# Patient Record
Sex: Male | Born: 1940 | Race: White | Hispanic: No | Marital: Married | Smoking: Never smoker
Health system: Southern US, Community
[De-identification: ages and names within clinical notes are randomized; demographics above are authoritative.]

## PROBLEM LIST (undated history)

## (undated) DIAGNOSIS — I10 Essential (primary) hypertension: Secondary | ICD-10-CM

## (undated) DIAGNOSIS — I6521 Occlusion and stenosis of right carotid artery: Secondary | ICD-10-CM

## (undated) DIAGNOSIS — M109 Gout, unspecified: Secondary | ICD-10-CM

## (undated) DIAGNOSIS — K4090 Unilateral inguinal hernia, without obstruction or gangrene, not specified as recurrent: Secondary | ICD-10-CM

## (undated) DIAGNOSIS — K219 Gastro-esophageal reflux disease without esophagitis: Secondary | ICD-10-CM

## (undated) DIAGNOSIS — R0683 Snoring: Principal | ICD-10-CM

## (undated) DIAGNOSIS — N200 Calculus of kidney: Secondary | ICD-10-CM

## (undated) DIAGNOSIS — I517 Cardiomegaly: Secondary | ICD-10-CM

## (undated) DIAGNOSIS — M8580 Other specified disorders of bone density and structure, unspecified site: Secondary | ICD-10-CM

## (undated) DIAGNOSIS — K5792 Diverticulitis of intestine, part unspecified, without perforation or abscess without bleeding: Secondary | ICD-10-CM

## (undated) DIAGNOSIS — I739 Peripheral vascular disease, unspecified: Secondary | ICD-10-CM

## (undated) HISTORY — DX: Peripheral vascular disease, unspecified: I73.9

## (undated) HISTORY — DX: Snoring: R06.83

## (undated) HISTORY — DX: Gout, unspecified: M10.9

## (undated) HISTORY — DX: Unilateral inguinal hernia, without obstruction or gangrene, not specified as recurrent: K40.90

## (undated) HISTORY — PX: HERNIA REPAIR: SHX51

## (undated) HISTORY — DX: Occlusion and stenosis of right carotid artery: I65.21

## (undated) HISTORY — DX: Diverticulitis of intestine, part unspecified, without perforation or abscess without bleeding: K57.92

## (undated) HISTORY — DX: Essential (primary) hypertension: I10

## (undated) HISTORY — DX: Other specified disorders of bone density and structure, unspecified site: M85.80

## (undated) HISTORY — DX: Gastro-esophageal reflux disease without esophagitis: K21.9

## (undated) HISTORY — DX: Cardiomegaly: I51.7

## (undated) HISTORY — DX: Calculus of kidney: N20.0

---

## 1998-01-15 HISTORY — PX: OTHER SURGICAL HISTORY: SHX169

## 1999-08-30 ENCOUNTER — Encounter: Admission: RE | Admit: 1999-08-30 | Discharge: 1999-08-30 | Payer: Self-pay | Admitting: *Deleted

## 1999-08-30 ENCOUNTER — Encounter: Payer: Self-pay | Admitting: *Deleted

## 2000-06-07 ENCOUNTER — Encounter: Payer: Self-pay | Admitting: Emergency Medicine

## 2000-06-07 ENCOUNTER — Emergency Department (HOSPITAL_COMMUNITY): Admission: EM | Admit: 2000-06-07 | Discharge: 2000-06-07 | Payer: Self-pay | Admitting: Emergency Medicine

## 2000-06-10 ENCOUNTER — Encounter: Payer: Self-pay | Admitting: Orthopedic Surgery

## 2000-06-10 ENCOUNTER — Inpatient Hospital Stay (HOSPITAL_COMMUNITY): Admission: RE | Admit: 2000-06-10 | Discharge: 2000-06-12 | Payer: Self-pay | Admitting: Orthopedic Surgery

## 2002-02-02 ENCOUNTER — Encounter: Payer: Self-pay | Admitting: Vascular Surgery

## 2002-02-04 ENCOUNTER — Encounter (INDEPENDENT_AMBULATORY_CARE_PROVIDER_SITE_OTHER): Payer: Self-pay | Admitting: *Deleted

## 2002-02-04 ENCOUNTER — Inpatient Hospital Stay (HOSPITAL_COMMUNITY): Admission: RE | Admit: 2002-02-04 | Discharge: 2002-02-05 | Payer: Self-pay | Admitting: Vascular Surgery

## 2004-01-28 ENCOUNTER — Inpatient Hospital Stay (HOSPITAL_COMMUNITY): Admission: EM | Admit: 2004-01-28 | Discharge: 2004-01-30 | Payer: Self-pay | Admitting: Emergency Medicine

## 2005-10-29 ENCOUNTER — Encounter: Admission: RE | Admit: 2005-10-29 | Discharge: 2005-10-29 | Payer: Self-pay | Admitting: Internal Medicine

## 2006-04-23 ENCOUNTER — Ambulatory Visit: Payer: Self-pay | Admitting: Vascular Surgery

## 2008-06-09 ENCOUNTER — Ambulatory Visit: Payer: Self-pay | Admitting: Vascular Surgery

## 2009-06-29 ENCOUNTER — Emergency Department (HOSPITAL_COMMUNITY): Admission: EM | Admit: 2009-06-29 | Discharge: 2009-06-29 | Payer: Self-pay | Admitting: Emergency Medicine

## 2009-06-30 ENCOUNTER — Ambulatory Visit (HOSPITAL_COMMUNITY): Admission: AD | Admit: 2009-06-30 | Discharge: 2009-06-30 | Payer: Self-pay | Admitting: Urology

## 2010-04-02 LAB — COMPREHENSIVE METABOLIC PANEL
ALT: 28 U/L (ref 0–53)
AST: 26 U/L (ref 0–37)
CO2: 26 mEq/L (ref 19–32)
Calcium: 9.7 mg/dL (ref 8.4–10.5)
GFR calc Af Amer: 57 mL/min — ABNORMAL LOW (ref >60)
Potassium: 3.8 mEq/L (ref 3.5–5.1)
Sodium: 140 mEq/L (ref 135–145)
Total Protein: 7.5 g/dL (ref 6.0–8.3)

## 2010-04-02 LAB — DIFFERENTIAL
Eosinophils Absolute: 0.1 10*3/uL (ref 0.0–0.7)
Eosinophils Relative: 1 % (ref 0–5)
Lymphs Abs: 1.2 10*3/uL (ref 0.7–4.0)
Monocytes Relative: 4 % (ref 3–12)

## 2010-04-02 LAB — SURGICAL PCR SCREEN
MRSA, PCR: NEGATIVE
Staphylococcus aureus: NEGATIVE

## 2010-04-02 LAB — URINE MICROSCOPIC-ADD ON

## 2010-04-02 LAB — URINALYSIS, ROUTINE W REFLEX MICROSCOPIC
Bilirubin Urine: NEGATIVE
Glucose, UA: NEGATIVE mg/dL
Nitrite: NEGATIVE
Specific Gravity, Urine: 1.02 (ref 1.005–1.030)
pH: 5 (ref 5.0–8.0)

## 2010-04-02 LAB — CBC
MCHC: 34.5 g/dL (ref 30.0–36.0)
RBC: 4.64 MIL/uL (ref 4.22–5.81)
RDW: 13.1 % (ref 11.5–15.5)

## 2010-05-30 NOTE — Procedures (Signed)
CAROTID DUPLEX EXAM   INDICATION:  Follow up carotid artery disease.   HISTORY:  Diabetes:  No.  Cardiac:  No.  Hypertension:  Yes.  Smoking:  No.  Previous Surgery:  Right CEA with DPA, 02/04/02 by Dr. Hart Rochester.  CV History:  No.  Amaurosis Fugax No, Paresthesias No, Hemiparesis No.                                       RIGHT             LEFT  Brachial systolic pressure:         140               134  Brachial Doppler waveforms:         WNL               WNL  Vertebral direction of flow:        Antegrade         Antegrade  DUPLEX VELOCITIES (cm/sec)  CCA peak systolic                   110               120  ECA peak systolic                   104               135  ICA peak systolic                   103               Occluded  ICA end diastolic                   40                Occluded  PLAQUE MORPHOLOGY:                  N/A               Homogenous  PLAQUE AMOUNT:                      N/A               Occluded ICA  PLAQUE LOCATION:                    N/A               ICA   IMPRESSION:  1. Right internal carotid artery shows no evidence of restenosis,      status post carotid endarterectomy.  2. 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  3. No significant changes from previous study in 2008.   ___________________________________________  Quita Skye. Hart Rochester, M.D.   AS/MEDQ  D:  06/09/2008  T:  06/09/2008  Job:  [id number]

## 2010-06-02 NOTE — Discharge Summary (Signed)
[place]. [facility]  Patient:    Jason Callahan, Jason Callahan                       MRN: [id number] Adm. Date:  [id number] Disc. Date: [id number] Attending:  Dominica Severin Dictator:   Dorie Rank, P.A.                           Discharge Summary  ADMISSION DIAGNOSES: 1. Severe comminuted intra-articular right radius fracture with displacement. 2. Left foot pain about the fifth metatarsophalangeal joints. 3. L5 fracture about lumbar vertebral body, stable. 4. Hypertension. 5. Gout.  DISCHARGE DIAGNOSES: 1. Severe comminuted intra-articular right radius fracture with displacement. 2. Left foot pain about the fifth metatarsophalangeal joints. 3. L5 fracture about lumbar vertebral body, stable. 4. Hypertension. 5. Gout.  OPERATIONS:  On Jun 10, 2000, the patient was taken to the operating room for open reduction and internal fixation of the right radius, external fixation of right radius, bone grafting via allograft, bone graft of right wrist fracture, posterior interosseous neurectomy, right wrist, examination under anesthesia, and fluoroscopy of the left foot with subsequent left fourth and fifth metatarsophalangeal dislocation reductions.  CONSULTS:  None.  BRIEF HISTORY:  Jason Callahan is a 70 year old male who presented to the [place] ED after falling 12 feet.  X-rays there revealed a right complex comminuted intra-articular fracture of the right wrist and he was having left foot pain.  Elisha Ponder, M.D., was called in to evaluate the patient after he was stabilized by Ollen Gross, M.D.  It was felt that he would benefit from undergoing an ORIF and external fixation of the right wrist with bone grafting and for evaluation of the left foot.  HOSPITAL COURSE:  The patient was admitted on Jun 10, 2000, and had the above-stated procedure.  He was placed on a nonweightbearing status to the left foot and nonweightbearing to the right wrist.   Postoperatively, he used a PCA of morphine for pain with Percocet for breakthrough pain.  This was discontinued on postoperative day #1.  He was utilizing Percocet with adequate pain control throughout the remainder of his hospital stay.  His home medications of atenolol, allopurinol, and Nasonex were resumed postoperatively.  IV Ancef was used postoperatively for 48 hours for postoperative infection control.  Occupational therapy and physical therapy worked with the patient for edema control to the fingers and bed to chair transfers.  His regular diet was resumed.  By postoperative day #1, he was taking food well without any nausea or vomiting.  He utilized a mission sling to the right upper extremity with ice for edema control.  Postoperatively, an IV of 1/2 normal saline with 20 mEq with Kay-Ciel was utilized and was [facility] on postoperative day #1 when he was taken p.o. well by mouth.  A bedside incentive spirometer was utilized q.1h. while awake for prevention of atelectasis.  Vitals and neurovascular checks were performed on a routine basis and were intact throughout his hospital stay.  On postoperative day #1, he was complaining of some dysuria, so a UA was obtained.  He was found to have a urinary tract infection and was started on Cipro for this.  By postoperative day #2, on Jun 12, 2000, it was felt that he was medically stable for discharge.  LABORATORY VALUES:  The hemoglobin and hematocrit on Jun 10, 2000, were 15.1 and 43.0, respectively.  The  BMET on Jun 10, 2000, was within normal limits other than a glucose of 127.  The UA on Jun 11, 2000, was within normal limits.  ELECTROCARDIOGRAM:  On Jun 10, 2000, the EKG revealed sinus bradycardia, left axis deviation, and was unconfirmed.  X-RAYS:  Fluoroscopy in the operating room on Jun 10, 2000, revealed plate and screws in place across the comminuted distal right radius fracture. Fluoroscopy while in the operating room on Jun 10, 2000, of the left foot on one view revealed dislocation to the fourth and fifth metatarsophalangeal joints and on second view revealed alignment of this joint space.  CONDITION ON DISCHARGE:  Improved.  DISCHARGE PLANS:  The patient was discharged home.  ACTIVITY:  He was to continue nonweightbearing to the left lower extremity and nonweightbearing to the right upper extremity.  DISCHARGE MEDICATIONS:  He was given prescriptions for Cipro, Peri-Colace, Robaxin, Percocet, and Phenergan.  He was to utilize over-the-counter enteric-coated aspirin one p.o. b.i.d. and any home medications.  FOLLOW-UP:  He is to follow up with Onalee Hua III, M.D., in one week and is to have occupational therapy on the same day.  WOUND CARE:  He can shower on postoperative day #5 as long as he kept his dressings clean and dry.  DIET:  Resume his regular diet.  SPECIAL INSTRUCTIONS:  Call if he has any questions or concerns prior to his regularly scheduled appointment. DD:  06/27/00 TD:  06/27/00 Job: [id number]

## 2010-06-02 NOTE — Op Note (Signed)
NAME:  Jason Callahan, Jason Callahan                          ACCOUNT NO.:  [id number]   MEDICAL RECORD NO.:  [id number]                   PATIENT TYPE:  INP   LOCATION:  2858                                 FACILITY:  [facility]   PHYSICIAN:  Quita Skye. Hart Rochester, M.D.               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   OPERATION:  Right carotid endarterectomy with Dacron patch angioplasty.   SURGEON:  Quita Skye. Hart Rochester, M.D.   FIRST ASSISTANT:  Claudette Krell.   ANESTHESIA:  General endotracheal.   HISTORY:  This patient was found to have a carotid bruit on physical exam  and duplex scanning revealed the left internal carotid was totally occluded.  The right internal carotid had an approximate 75% stenosis.  It was  asymptomatic.  He was scheduled for a right carotid endarterectomy.   PROCEDURE:  The patient was taken to the operating room, placed in supine  position at which time satisfactory general endotracheal anesthesia was  administered.  The right neck was prepped with Betadine scrubbing solution,  draped in routine sterile manner.  Incision was made along the anterior  border of the sternocleidomastoid muscle, carried down through the  subcutaneous tissue and platysma using the Bovie.  The common facial vein  and external jugular veins were ligated with 3-0 Silk ties and divided  exposing the common internal and external carotid arteries.  Care was taken  not to injure the vagus or hypoglossal nerves, both of which were exposed.  There was a calcified atherosclerotic plaque at the carotid bifurcation  extending up the internal carotid artery about 3 cm.   Distal vessel appeared  normal.  The #10 shunt was prepared and the patient was heparinized.  The  carotid vessels were occluded.  With vascular clamps longitudinal opening  made in the common carotid with a #15 blade extending up the internal  carotid with the Potts scissors to a point distal to the disease.  The  plaque was about 75% stenotic in severity.  The #10 shunt was inserted  without difficulty re-establishing flow in about two minutes.  A standard  endarterectomy was then performed using the elevator and the Potts scissors  with an eversion endarterectomy of the external carotid.  The plaque fed the  distal internal carotid artery nicely not requiring any tacking sutures.  The lumen was thoroughly irrigated with heparin saline, all loose debris  carefully removed.  The arteriotomy was closed with a patch using continuous  6-0 Prolene.  Prior to completion of the closure the shunt was removed after  about 25 minutes of  shunt time following antegrade and retrograde flushing  the closure was completed with re-establishment of flow initially up the  external and up the internal branch.  The carotid was occluded for less than  two minutes for removal of the  shunt.  Protamine was then given to reverse the heparin.  Following adequate  hemostasis the wound was irrigated with saline, closed with layers of Vicryl  in the subcuticular fashion.  Sterile dressing applied.  Patient taken to  the recovery room in satisfactory condition.                                               Quita Skye Hart Rochester, M.D.    JDL/MEDQ  D:  02/04/2002  T:  02/04/2002  Job:  [id number]
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
## 2010-06-02 NOTE — Op Note (Signed)
[place]. [facility]  Patient:    Jason Callahan, Jason Callahan                       MRN: [id number] Proc. Date: 06/10/00 Adm. Date:  [id number] Attending:  Dominica Severin                           Operative Report  DATE OF BIRTH:  07-10-1940  PREOPERATIVE DIAGNOSIS: 1. Severe comminuted intra-articular right radius fracture with displacement,    status post initial stabilization by Dr. Despina Hick. 2. Left foot pain most notably about the fifth and for the metatarsophalangeal    joints. 3. L5 fracture about the lumbar vertebral body, stable.  POSTOPERATIVE DIAGNOSIS: 1. Severe comminuted intra-articular right radius fracture with displacement,    status post initial stabilization by Dr. Despina Hick. 2. Left foot pain most notably about the fifth and for the metatarsophalangeal    joints. 3. L5 fracture about the lumbar vertebral body, stable.  OPERATION PERFORMED: 1. Open reduction internal fixation, right radius fracture. 2. External fixation, right radius. 3. Bone grafting in the form of allograft, bone graft right wrist fracture. 4. Posterior interosseous nerve neurectomy, right wrist. 5. Examination under anesthesia and fluoroscopy of right wrist distal    radioulnar joint. 6. Examination under anesthesia and fluoroscopy left foot with subsequent left    fourth and fifth metatarsophalangeal dislocation reductions.  SURGEON:  Elisha Ponder, M.D.  ASSISTANT:  None.  COMPLICATIONS:  None.  DRAINS:  None.  TOURNIQUET TIME:  Two hours.  ANESTHESIA:  General.  COMPLICATIONS:  None immediately.  INDICATIONS FOR PROCEDURE:  The patient is a very pleasant 70 year old male who presents with the above-mentioned diagnosis.  I have counseled him in regard to the risks and benefits of surgery including the risks of infection, bleeding, anesthesia, damage to normal structures and failure of surgery to accomplish its intended goals of relieving symptoms and  restoring function. With this in mind, he desires to proceed.  All questions have been encouraged and answered preoperatively.  The patient has a high degree of left foot pain. We are planning fluoroscopy, intraoperatively and open reduction internal fixation versus reduction of dislocation as necessary.  I discussed this with him at length.  A tetanus shot was given today.  The patient understands the risks of the right radius fracture to include traumatic arthritis, RSD/CRPS. He also understands risks of stiffness, chronic pain, etc.  With this in mind, he desires to proceed.  All questions have been encouraged and answered.  OPERATIVE FINDINGS:  The patient had fourth and fifth metatarsophalangeal dislocations of the left foot.  This was reduced closed and stable.  The patient had a significantly comminuted intra-articular right radius fracture. This was treated with ORIF external fixation and bone grafting.  The patient had good stability at the conclusion of the case.  I took great care to restore interarticular alignment etc.  There was no evidence of interosseous ligament tear about the scapholunate interval.  DESCRIPTION OF PROCEDURE:  The patient was seen by myself and anesthesia. Preoperative Ancef was given. He was taken to the operative suite and underwent smooth induction of general endotracheal anesthesia.  He was laid supine and appropriately padded and prepped and draped in the usual sterile fashion about the right upper extremity.  I supervised this.  At the same time, the patient had fluoroscopy of the left foot.  This revealed fourth  and fifth metatarsophalangeal joint dislocations.  I subsequently reduced these without difficulty.  I then stress tested them.  Following this, I performed an examination under anesthesia and fluoroscopy and then placed a foot dressing on to secure the reduction.  Once this was done, attention was turned to the radius.  The arm was  elevated. Tourniquet was insufflated to 250 mmHg and the patient had the operation ensue with fingertrap traction followed by placement of an external fixator.  Two proximal pins were placed through the radius at the distal middle third junction.  Dissection was carried through the skin with knife blade.  The interval between the ECRL and ECRB was created ____________ with standard AO technique, checked under fluoroscopy and noted to be adequate.  Following this, copious irrigation was applied and the wound was closed with Prolene. Next, the two half pins were inserted in the second metacarpal.  A small stab incision was made followed by blunt dissection down to the bone.  Care was taken to avoid the extensor tendons and the patient had bicortical purchase then obtained with two half pins.  These two half pins were placed without difficulty.  Sutures were then placed and fluoro was brought in to check the reduction.  Following this, the patient had the wrist manipulated.  Two radiostyloid pins were placed.  These engaged the proximal ulna cortex.  They were 0.062 and 0.045 K-wires.  This reduced the radial styloid ____________ nicely.  There was still a large die punch fracture present.  Once this was done, finger traps were removed.  The external fixator was assembled and held in significant traction.  Following this, an incision was made dorsally and carried down between the fourth and third intervals.  The EPL was transposed and the extensor retinaculum Z-lengthened.  The interval between the third and fourth compartment was developed.  The posterior interosseus nerve and artery were identified and underwent a posterior interosseous nerve neurectomy.  This was done with crushing and cauterization technique.  Following this, the patient had access to the fracture site gained.  I did open the capsule about the wrist joint so as to see the anatomy of the articular cartilage.   I then very  carefully dissected the die punch fragment, elevated this up and bone grafted behind it with allograft cancellous bone chips.  These bone chips were  placed tightly.  There was a ____________ insignificant comminution dorsally. I had to perform provisional fixation of the die punch fragment with 0.045 K-wires.  These were introduced without difficulty and held via provisional reduction.  Following this, I placed a small L-plate on the radius.  The L-plate was checked under fluoroscopy and following this, screws were placed to harness the die punch fracture fragment.  Adjustments were made accordingly and following this, additional bone graft was placed.  Depuy calcium phosphate mixture was mixed by myself and then injected to seal the reduction nicely. Following this, I performed examination under anesthesia of the distal radioulnar joint.  This was noted to be competent.  There was no evidence of dislocation.  Following this, adjustments were made in the external fixator, so as to not over distract the radiocarpal joint.  The wrist was placed in extension to allow for maximum flexor tendon pull-through.  Following this, the fixator was tightened down and the tourniquet was deflated.  Hemostasis was obtained.  Next, the pins about the radial styloid were cut beneath the skin.  The ____________  K-wires harnessing the die punch fragment  were left outside the skin for early removal.  The patient had good capillary refill, no excessive bleeding and the dorsum of the wound was then closed with Prolene. This was done without difficulty.  Following this, the patient had Xeroform gauze applied to the wound followed by sterile gauze, Kerlix, Webril and a long-arm splint with the wrist in some supination.  The patient tolerated the procedure well without difficulty.  Following this, the left foot was once again fluorod.  The reduction about the fourth and fifth MTP joints were noted to be stable and  had not dislocated.  With this performed and noted, I then placed a short left foot splint on the affected extremity.  The patient tolerated this well.  Once this was done, the patient was extubated and transferred to the recovery room in stable condition.  Sponge and instrument counts were correct.  He will be admitted for intravenous antibiotics, pain control, IV fluid hydration, etc.  I have discussed all issues with his family including his wife.   The patient will need prolonged therapy and other measures.  I have discussed these issues with him at length.  It has been a pleasure to participate in their care and I look forward to participating in their postoperative recovery. DD:  06/10/00 TD:  06/10/00 Job: [id number]
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
## 2010-09-01 ENCOUNTER — Other Ambulatory Visit (INDEPENDENT_AMBULATORY_CARE_PROVIDER_SITE_OTHER): Payer: Medicare Other

## 2010-09-01 DIAGNOSIS — I6529 Occlusion and stenosis of unspecified carotid artery: Secondary | ICD-10-CM

## 2010-09-08 NOTE — Procedures (Unsigned)
CAROTID DUPLEX EXAM  INDICATION:  Follow up carotid artery disease.  HISTORY: Diabetes:  No. Cardiac:  No. Hypertension:  Yes. Smoking:  No. Previous Surgery:  Right carotid endarterectomy with Dacron patch angioplasty, 02/04/2002, by Dr. Hart Rochester. CV History:  Currently asymptomatic. Amaurosis Fugax No, Paresthesias No, Hemiparesis No.                                      RIGHT             LEFT Brachial systolic pressure:         138               140 Brachial Doppler waveforms:         Normal            Normal Vertebral direction of flow:        Antegrade         Antegrade DUPLEX VELOCITIES (cm/sec) CCA peak systolic                   107               86 ECA peak systolic                   88                87 ICA peak systolic                   93                Occluded ICA end diastolic                   41 PLAQUE MORPHOLOGY: PLAQUE AMOUNT:                      None PLAQUE LOCATION:  IMPRESSION: 1. Patent right carotid endarterectomy site with no evidence of     restenosis of the internal carotid artery. 2. Left internal carotid artery known occlusion. 3. No significant changes in comparison to the previous examination.  ___________________________________________ Quita Skye Hart Rochester, M.D.  EM/MEDQ  D:  09/01/2010  T:  09/01/2010  Job:  [id number]

## 2011-02-26 ENCOUNTER — Other Ambulatory Visit: Payer: Self-pay | Admitting: Dermatology

## 2012-05-07 DIAGNOSIS — H3320 Serous retinal detachment, unspecified eye: Secondary | ICD-10-CM | POA: Insufficient documentation

## 2012-05-07 DIAGNOSIS — T8522XA Displacement of intraocular lens, initial encounter: Secondary | ICD-10-CM | POA: Insufficient documentation

## 2012-12-09 ENCOUNTER — Other Ambulatory Visit (HOSPITAL_COMMUNITY): Payer: Medicare Other

## 2012-12-10 ENCOUNTER — Ambulatory Visit (HOSPITAL_COMMUNITY)
Admission: RE | Admit: 2012-12-10 | Discharge: 2012-12-10 | Disposition: A | Discharge status: Home / Self Care | Payer: Medicare Other | Source: Ambulatory Visit | Attending: Vascular Surgery | Admitting: Vascular Surgery

## 2012-12-10 ENCOUNTER — Other Ambulatory Visit (HOSPITAL_COMMUNITY): Payer: Self-pay | Admitting: Internal Medicine

## 2012-12-10 DIAGNOSIS — Z48812 Encounter for surgical aftercare following surgery on the circulatory system: Secondary | ICD-10-CM

## 2012-12-10 DIAGNOSIS — I6529 Occlusion and stenosis of unspecified carotid artery: Secondary | ICD-10-CM | POA: Insufficient documentation

## 2013-02-09 ENCOUNTER — Other Ambulatory Visit: Payer: Self-pay | Admitting: Dermatology

## 2014-02-01 ENCOUNTER — Other Ambulatory Visit: Payer: Self-pay | Admitting: Dermatology

## 2014-02-10 ENCOUNTER — Encounter: Payer: Self-pay | Admitting: Neurology

## 2014-02-10 ENCOUNTER — Ambulatory Visit (INDEPENDENT_AMBULATORY_CARE_PROVIDER_SITE_OTHER): Payer: BLUE CROSS/BLUE SHIELD | Admitting: Neurology

## 2014-02-10 VITALS — BP 156/91 | HR 88 | Resp 14 | Ht 69.75 in | Wt 200.0 lb

## 2014-02-10 DIAGNOSIS — I639 Cerebral infarction, unspecified: Secondary | ICD-10-CM

## 2014-02-10 DIAGNOSIS — R0683 Snoring: Secondary | ICD-10-CM | POA: Insufficient documentation

## 2014-02-10 DIAGNOSIS — I739 Peripheral vascular disease, unspecified: Secondary | ICD-10-CM

## 2014-02-10 DIAGNOSIS — I63239 Cerebral infarction due to unspecified occlusion or stenosis of unspecified carotid arteries: Secondary | ICD-10-CM

## 2014-02-10 DIAGNOSIS — G4730 Sleep apnea, unspecified: Secondary | ICD-10-CM

## 2014-02-10 HISTORY — DX: Snoring: R06.83

## 2014-02-10 NOTE — Patient Instructions (Signed)
Sleep Apnea  Sleep apnea is a sleep disorder characterized by abnormal pauses in breathing while you sleep. When your breathing pauses, the level of oxygen in your blood decreases. This causes you to move out of deep sleep and into light sleep. As a result, your quality of sleep is poor, and the system that carries your blood throughout your body (cardiovascular system) experiences stress. If sleep apnea remains untreated, the following conditions can develop:  High blood pressure (hypertension).  Coronary artery disease.  Inability to achieve or maintain an erection (impotence).  Impairment of your thought process (cognitive dysfunction). There are three types of sleep apnea: 1. Obstructive sleep apnea--Pauses in breathing during sleep because of a blocked airway. 2. Central sleep apnea--Pauses in breathing during sleep because the area of the brain that controls your breathing does not send the correct signals to the muscles that control breathing. 3. Mixed sleep apnea--A combination of both obstructive and central sleep apnea. RISK FACTORS The following risk factors can increase your risk of developing sleep apnea:  Being overweight.  Smoking.  Having narrow passages in your nose and throat.  Being of older age.  Being male.  Alcohol use.  Sedative and tranquilizer use.  Ethnicity. Among individuals younger than 35 years, African Americans are at increased risk of sleep apnea. SYMPTOMS   Difficulty staying asleep.  Daytime sleepiness and fatigue.  Loss of energy.  Irritability.  Loud, heavy snoring.  Morning headaches.  Trouble concentrating.  Forgetfulness.  Decreased interest in sex. DIAGNOSIS  In order to diagnose sleep apnea, your caregiver will perform a physical examination. Your caregiver may suggest that you take a home sleep test. Your caregiver may also recommend that you spend the night in a sleep lab. In the sleep lab, several monitors record  information about your heart, lungs, and brain while you sleep. Your leg and arm movements and blood oxygen level are also recorded. TREATMENT The following actions may help to resolve mild sleep apnea:  Sleeping on your side.   Using a decongestant if you have nasal congestion.   Avoiding the use of depressants, including alcohol, sedatives, and narcotics.   Losing weight and modifying your diet if you are overweight. There also are devices and treatments to help open your airway:  Oral appliances. These are custom-made mouthpieces that shift your lower jaw forward and slightly open your bite. This opens your airway.  Devices that create positive airway pressure. This positive pressure "splints" your airway open to help you breathe better during sleep. The following devices create positive airway pressure:  Continuous positive airway pressure (CPAP) device. The CPAP device creates a continuous level of air pressure with an air pump. The air is delivered to your airway through a mask while you sleep. This continuous pressure keeps your airway open.  Nasal expiratory positive airway pressure (EPAP) device. The EPAP device creates positive air pressure as you exhale. The device consists of single-use valves, which are inserted into each nostril and held in place by adhesive. The valves create very little resistance when you inhale but create much more resistance when you exhale. That increased resistance creates the positive airway pressure. This positive pressure while you exhale keeps your airway open, making it easier to breath when you inhale again.  Bilevel positive airway pressure (BPAP) device. The BPAP device is used mainly in patients with central sleep apnea. This device is similar to the CPAP device because it also uses an air pump to deliver continuous air pressure   through a mask. However, with the BPAP machine, the pressure is set at two different levels. The pressure when you  exhale is lower than the pressure when you inhale.  Surgery. Typically, surgery is only done if you cannot comply with less invasive treatments or if the less invasive treatments do not improve your condition. Surgery involves removing excess tissue in your airway to create a wider passage way. Document Released: 12/22/2001 Document Revised: 04/28/2012 Document Reviewed: 05/10/2011 Exit[employer] Patient Information 2015 [employer]. This information is not intended to replace advice given to you by your health care provider. Make sure you discuss any questions you have with your health care provider.  

## 2014-02-10 NOTE — Progress Notes (Signed)
SLEEP MEDICINE CLINIC   Provider:  Larey Callahan, M D  Referring Provider: Jerlyn Ly, MD Primary Care Physician:  Jason Ly, MD  Chief Complaint  Patient presents with  . NP Jason Callahan Sleep consult    Rm 11, alone    HPI:  Jason Callahan is a 74 y.o. male , seen here as a referral from Jason Callahan for a sleep evaluation.    Jason Callahan reports that she has been advised by his spouse that he snores also he feels that this is not having any impact on his sleep quality or duration.  He usually goes to bed usually  Around midnight and rises at  7. 15 or 8 AM,depending on the day of the week  1-2 nocturias, no problem to go back to sleep. He estimates a good 7-8 hours of nocturnal sleep and usually does not have fragmented sleep either. He does recall some of his dreams but he is not suffering from any nightmares or extremely vivid dreams neither  does he act out upon his dreams. He relies on an alarm but usually can easily arise at the time. He does not consume any caffeine in the morning. He feels refreshed and does not report headaches or a dry mouth. He is exposed to natural daylight, he is a regular exerciser. He endorsed alcohol consumption, 1-2 glasses of wine while watching TV.    He endorsed the Epworth sleepiness score at 4 points which is low and the fatigue severity score on 11 points which is also in the lower range. He has no past medical history of depression or sleep perception problems. He is one of 7 siblings of which all are in good health. He advised me that his mother died of Parkinson's disease and heart disease at age 56 and his father passed away at age 54.  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 The couple's son troubled with some last autumn and as the family shared hotel room he was unable to sleep because of his father's loud  snoring. He also seemed to have witnessed apneas or irregular breathing patterns.  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  He has never worn dentures, retainers etc.  No history of facial or airway surgeries but CEA on the right , 2005. Dr. Kellie Callahan.     Review of Systems: Out of a complete 14 system review, the patient complains of only the following symptoms, and all other reviewed systems are negative. Epworth score 4 , Fatigue severity score 11  , depression score 2 points.   History   Social History  . Marital Status: Married    Spouse Name: Jason Callahan    Number of Children: 2  . Years of Education: college gr   Occupational History  . retired    Social History Main Topics  . Smoking status: Never Smoker   . Smokeless tobacco: Not on file  . Alcohol Use: 4.2 oz/week    7 Not specified per week  . Drug Use: No  . Sexual Activity: Not on file   Other Topics Concern  . Not on file   Social History Narrative   Caffeine 2 cups avg daily.     Family History  Problem Relation Age of Onset  . Heart disease Father   .  Arthritis Mother     Past Medical History  Diagnosis Date  . Hypertension   . Gout   . PVD (peripheral vascular disease)   . Diverticulitis   . GERD (gastroesophageal reflux disease)   . 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 Nephrolithiasis   . Hernia, inguinal, left   . Osteopenia   . LVH (left ventricular hypertrophy)   . Snoring 02/10/2014    Past Surgical History  Procedure Laterality Date  . Hernia repair Left   . Cea  2000    Current Outpatient Prescriptions  Medication Sig Dispense Refill  . allopurinol (ZYLOPRIM) 300 MG tablet Take 300 mg by mouth daily.   2  . cholecalciferol (VITAMIN D) 1000 UNITS tablet Take 1,000 Units by mouth daily.    . cyanocobalamin 1000 MCG tablet Take 1,000 mcg by mouth daily.    . irbesartan-hydrochlorothiazide (AVALIDE) 300-12.5 MG per tablet Take  0.5 tablets by mouth daily.   0  . Multiple Vitamin (MULTIVITAMIN) tablet Take 1 tablet by mouth daily. Centrum Silver    . ranitidine (ZANTAC) 75 MG tablet Take 75 mg by mouth. 4 times a week and prn    . rosuvastatin (CRESTOR) 20 MG tablet Take 20 mg by mouth daily.    . sildenafil (VIAGRA) 100 MG tablet Take 100 mg by mouth as needed for erectile dysfunction.     No current facility-administered medications for this visit.    Allergies as of 02/10/2014  . (No Known Allergies)    Vitals: BP 156/91 mmHg  Pulse 88  Resp 14  Ht 5' 9.75" (1.772 m)  Wt 200 lb (90.719 kg)  BMI 28.89 kg/m2 Last Weight:  Wt Readings from Last 1 Encounters:  02/10/14 200 lb (90.719 kg)       Last Height:   Ht Readings from Last 1 Encounters:  02/10/14 5' 9.75" (1.772 m)    Physical exam:  General: The patient is awake, alert and appears not in acute distress. The patient is well groomed. Head: Normocephalic, atraumatic. Neck is supple. Mallampati 3 neck circumference: 16. Nasal airflow somewhat restricted, congested , TMJ is not evident . Retrognathia is seen.  Cardiovascular:  Regular rate and rhythm, without  murmurs or carotid bruit,   There is no carotid pulse on the left, the right is clear. and without distended neck veins. Respiratory: Lungs are clear to auscultation. Skin:  Without evidence of edema, or rash Trunk: BMI is borderline  elevated and patient  has normal posture.  Neurologic exam : The patient is awake and alert, oriented to place and time.   Memory subjective  described as intact. There is a normal attention span & concentration ability.  Speech is fluent without  dysarthria, dysphonia or aphasia. Mood and affect are appropriate.  Cranial nerves: Pupils are equal and briskly reactive to light. Funduscopic exam without evidence of pallor or edema.  Extraocular movements  in vertical and horizontal planes intact and without nystagmus. Visual fields by finger perimetry are  intact. Hearing to finger rub intact.  Facial sensation intact to fine touch. Facial motor strength is symmetric and tongue and uvula move midline. Shoulder shrug is equally strong.  Motor exam:   Normal tone, muscle bulk and symmetric, strength in all extremities.  Sensory:  Fine touch, pinprick and vibration were tested in all extremities. Proprioception is tested in the upper extremities only. This was normal.  Coordination: Rapid alternating movements in the fingers/hands is normal. Finger-to-nose maneuver  normal without evidence of  ataxia, dysmetria or tremor.  Gait and station: Patient walks without assistive device and is able unassisted to climb up to the exam table. Strength within normal limits. Stance is stable and normal. Tandem gait is unfragmented. Romberg testing is negative.  Deep tendon reflexes: in the  upper and lower extremities are symmetric and intact. Babinski maneuver deferred.   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 He carries a diagnosis of hypertension and mild hyperlipidemia peripheral vascular disease and has undergone a carotid endarterectomy. I would not consider him at a higher risk of stroke based on a occlusion of a single pre-cerebral vessel. He has retrognathia, high grade mallampati and is slightly overweight    The patient was advised of the nature of the diagnosed sleep disorder, the treatment options and risks for general a health and wellness arising from not treating the condition. Visit duration was  30 minutes.   Plan:   Treatment plan and additional workup :  Split study , AHI 15 and score at 4 %.  No co2 needed. No sleep aid. May use nasal decongestant prior to night in sleep lab.      Asencion Partridge Neidy Guerrieri MD  02/10/2014

## 2014-03-02 ENCOUNTER — Ambulatory Visit (INDEPENDENT_AMBULATORY_CARE_PROVIDER_SITE_OTHER): Payer: BLUE CROSS/BLUE SHIELD | Admitting: Neurology

## 2014-03-02 DIAGNOSIS — R0683 Snoring: Secondary | ICD-10-CM

## 2014-03-02 DIAGNOSIS — I63239 Cerebral infarction due to unspecified occlusion or stenosis of unspecified carotid arteries: Secondary | ICD-10-CM

## 2014-03-02 DIAGNOSIS — I739 Peripheral vascular disease, unspecified: Secondary | ICD-10-CM

## 2014-03-02 DIAGNOSIS — G4730 Sleep apnea, unspecified: Secondary | ICD-10-CM

## 2014-03-02 NOTE — Sleep Study (Signed)
Please see the scanned sleep study interpretation located in the Procedure tab within the Chart Review section. 

## 2014-03-09 DIAGNOSIS — G4730 Sleep apnea, unspecified: Secondary | ICD-10-CM | POA: Insufficient documentation

## 2014-03-09 DIAGNOSIS — I63239 Cerebral infarction due to unspecified occlusion or stenosis of unspecified carotid arteries: Secondary | ICD-10-CM | POA: Insufficient documentation

## 2014-03-18 ENCOUNTER — Other Ambulatory Visit: Payer: Self-pay | Admitting: Neurology

## 2014-03-18 ENCOUNTER — Telehealth: Payer: Self-pay | Admitting: Neurology

## 2014-03-18 ENCOUNTER — Encounter: Payer: Self-pay | Admitting: Neurology

## 2014-03-18 DIAGNOSIS — G4733 Obstructive sleep apnea (adult) (pediatric): Secondary | ICD-10-CM

## 2014-03-18 NOTE — Telephone Encounter (Signed)
Patient was contacted and provided the results of his split night sleep study.  Patient was informed that there was a diagnosis of sleep apnea and that treatment was advised.  Numerous questions and concerns were addressed with patient.  It was explained to patient that a desensitization session would be requested of the DME.  Patient was referred to [facility] for CPAP set up.  Dr. Crist Infante was faxed a copy of the results.  The patient gave verbal permission to mail a copy of his test results.   Patient instructed to contact our office 6-8 weeks post set up to schedule a follow up appointment.

## 2014-03-19 ENCOUNTER — Encounter: Payer: Self-pay | Admitting: *Deleted

## 2014-05-07 ENCOUNTER — Encounter: Payer: Self-pay | Admitting: Neurology

## 2014-06-15 ENCOUNTER — Ambulatory Visit (INDEPENDENT_AMBULATORY_CARE_PROVIDER_SITE_OTHER): Payer: Medicare Other | Admitting: Neurology

## 2014-06-15 ENCOUNTER — Encounter: Payer: Self-pay | Admitting: Neurology

## 2014-06-15 VITALS — BP 164/84 | HR 84 | Resp 20 | Ht 69.69 in | Wt 192.0 lb

## 2014-06-15 DIAGNOSIS — M2619 Other specified anomalies of jaw-cranial base relationship: Secondary | ICD-10-CM | POA: Diagnosis not present

## 2014-06-15 DIAGNOSIS — R351 Nocturia: Secondary | ICD-10-CM | POA: Diagnosis not present

## 2014-06-15 DIAGNOSIS — Z9989 Dependence on other enabling machines and devices: Principal | ICD-10-CM

## 2014-06-15 DIAGNOSIS — G4733 Obstructive sleep apnea (adult) (pediatric): Secondary | ICD-10-CM | POA: Insufficient documentation

## 2014-06-15 NOTE — Patient Instructions (Signed)
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
## 2015-01-19 DIAGNOSIS — M25512 Pain in left shoulder: Secondary | ICD-10-CM | POA: Diagnosis not present

## 2015-01-19 DIAGNOSIS — M25532 Pain in left wrist: Secondary | ICD-10-CM | POA: Diagnosis not present

## 2015-02-01 DIAGNOSIS — G4733 Obstructive sleep apnea (adult) (pediatric): Secondary | ICD-10-CM | POA: Diagnosis not present

## 2015-02-02 DIAGNOSIS — L570 Actinic keratosis: Secondary | ICD-10-CM | POA: Diagnosis not present

## 2015-02-02 DIAGNOSIS — D485 Neoplasm of uncertain behavior of skin: Secondary | ICD-10-CM | POA: Diagnosis not present

## 2015-02-02 DIAGNOSIS — C44712 Basal cell carcinoma of skin of right lower limb, including hip: Secondary | ICD-10-CM | POA: Diagnosis not present

## 2015-02-02 DIAGNOSIS — L821 Other seborrheic keratosis: Secondary | ICD-10-CM | POA: Diagnosis not present

## 2015-02-02 DIAGNOSIS — Z85828 Personal history of other malignant neoplasm of skin: Secondary | ICD-10-CM | POA: Diagnosis not present

## 2015-02-02 DIAGNOSIS — D235 Other benign neoplasm of skin of trunk: Secondary | ICD-10-CM | POA: Diagnosis not present

## 2015-02-02 DIAGNOSIS — D225 Melanocytic nevi of trunk: Secondary | ICD-10-CM | POA: Diagnosis not present

## 2015-02-02 DIAGNOSIS — D2261 Melanocytic nevi of right upper limb, including shoulder: Secondary | ICD-10-CM | POA: Diagnosis not present

## 2015-02-02 DIAGNOSIS — L918 Other hypertrophic disorders of the skin: Secondary | ICD-10-CM | POA: Diagnosis not present

## 2015-02-02 DIAGNOSIS — D1801 Hemangioma of skin and subcutaneous tissue: Secondary | ICD-10-CM | POA: Diagnosis not present

## 2015-02-02 DIAGNOSIS — L565 Disseminated superficial actinic porokeratosis (DSAP): Secondary | ICD-10-CM | POA: Diagnosis not present

## 2015-02-14 DIAGNOSIS — Z125 Encounter for screening for malignant neoplasm of prostate: Secondary | ICD-10-CM | POA: Diagnosis not present

## 2015-02-14 DIAGNOSIS — E784 Other hyperlipidemia: Secondary | ICD-10-CM | POA: Diagnosis not present

## 2015-02-14 DIAGNOSIS — R7301 Impaired fasting glucose: Secondary | ICD-10-CM | POA: Diagnosis not present

## 2015-02-14 DIAGNOSIS — M859 Disorder of bone density and structure, unspecified: Secondary | ICD-10-CM | POA: Diagnosis not present

## 2015-02-14 DIAGNOSIS — M109 Gout, unspecified: Secondary | ICD-10-CM | POA: Diagnosis not present

## 2015-02-14 DIAGNOSIS — D539 Nutritional anemia, unspecified: Secondary | ICD-10-CM | POA: Diagnosis not present

## 2015-02-21 DIAGNOSIS — N200 Calculus of kidney: Secondary | ICD-10-CM | POA: Diagnosis not present

## 2015-02-21 DIAGNOSIS — N529 Male erectile dysfunction, unspecified: Secondary | ICD-10-CM | POA: Diagnosis not present

## 2015-02-21 DIAGNOSIS — Z9889 Other specified postprocedural states: Secondary | ICD-10-CM | POA: Diagnosis not present

## 2015-02-21 DIAGNOSIS — Z6827 Body mass index (BMI) 27.0-27.9, adult: Secondary | ICD-10-CM | POA: Diagnosis not present

## 2015-02-21 DIAGNOSIS — Z0000 Encounter for general adult medical examination without abnormal findings: Secondary | ICD-10-CM | POA: Diagnosis not present

## 2015-02-21 DIAGNOSIS — Z1389 Encounter for screening for other disorder: Secondary | ICD-10-CM | POA: Diagnosis not present

## 2015-02-21 DIAGNOSIS — Z1212 Encounter for screening for malignant neoplasm of rectum: Secondary | ICD-10-CM | POA: Diagnosis not present

## 2015-02-21 DIAGNOSIS — D538 Other specified nutritional anemias: Secondary | ICD-10-CM | POA: Diagnosis not present

## 2015-02-21 DIAGNOSIS — M859 Disorder of bone density and structure, unspecified: Secondary | ICD-10-CM | POA: Diagnosis not present

## 2015-02-21 DIAGNOSIS — E784 Other hyperlipidemia: Secondary | ICD-10-CM | POA: Diagnosis not present

## 2015-02-21 DIAGNOSIS — R7301 Impaired fasting glucose: Secondary | ICD-10-CM | POA: Diagnosis not present

## 2015-02-21 DIAGNOSIS — I10 Essential (primary) hypertension: Secondary | ICD-10-CM | POA: Diagnosis not present

## 2015-02-21 DIAGNOSIS — M109 Gout, unspecified: Secondary | ICD-10-CM | POA: Diagnosis not present

## 2015-03-30 DIAGNOSIS — E784 Other hyperlipidemia: Secondary | ICD-10-CM | POA: Diagnosis not present

## 2015-03-30 DIAGNOSIS — M791 Myalgia: Secondary | ICD-10-CM | POA: Diagnosis not present

## 2015-03-30 DIAGNOSIS — Z6828 Body mass index (BMI) 28.0-28.9, adult: Secondary | ICD-10-CM | POA: Diagnosis not present

## 2015-03-30 DIAGNOSIS — M2550 Pain in unspecified joint: Secondary | ICD-10-CM | POA: Diagnosis not present

## 2015-03-30 DIAGNOSIS — I10 Essential (primary) hypertension: Secondary | ICD-10-CM | POA: Diagnosis not present

## 2015-04-21 DIAGNOSIS — M353 Polymyalgia rheumatica: Secondary | ICD-10-CM | POA: Diagnosis not present

## 2015-04-21 DIAGNOSIS — Z7952 Long term (current) use of systemic steroids: Secondary | ICD-10-CM | POA: Diagnosis not present

## 2015-04-21 DIAGNOSIS — M1A09X0 Idiopathic chronic gout, multiple sites, without tophus (tophi): Secondary | ICD-10-CM | POA: Diagnosis not present

## 2015-04-28 DIAGNOSIS — G4733 Obstructive sleep apnea (adult) (pediatric): Secondary | ICD-10-CM | POA: Diagnosis not present

## 2015-05-18 DIAGNOSIS — M1A09X0 Idiopathic chronic gout, multiple sites, without tophus (tophi): Secondary | ICD-10-CM | POA: Diagnosis not present

## 2015-05-18 DIAGNOSIS — M353 Polymyalgia rheumatica: Secondary | ICD-10-CM | POA: Diagnosis not present

## 2015-05-18 DIAGNOSIS — Z7952 Long term (current) use of systemic steroids: Secondary | ICD-10-CM | POA: Diagnosis not present

## 2015-06-15 ENCOUNTER — Encounter: Payer: Self-pay | Admitting: Neurology

## 2015-06-15 ENCOUNTER — Ambulatory Visit (INDEPENDENT_AMBULATORY_CARE_PROVIDER_SITE_OTHER): Payer: PPO | Admitting: Neurology

## 2015-06-15 VITALS — BP 142/86 | HR 88 | Resp 20 | Ht 69.5 in | Wt 188.0 lb

## 2015-06-15 DIAGNOSIS — Z9989 Dependence on other enabling machines and devices: Principal | ICD-10-CM

## 2015-06-15 DIAGNOSIS — G4733 Obstructive sleep apnea (adult) (pediatric): Secondary | ICD-10-CM | POA: Diagnosis not present
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
## 2015-06-17 ENCOUNTER — Other Ambulatory Visit (HOSPITAL_COMMUNITY): Payer: Self-pay | Admitting: Internal Medicine

## 2015-06-17 ENCOUNTER — Ambulatory Visit (HOSPITAL_COMMUNITY)
Admission: RE | Admit: 2015-06-17 | Discharge: 2015-06-17 | Disposition: A | Discharge status: Home / Self Care | Payer: PPO | Source: Ambulatory Visit | Attending: Vascular Surgery | Admitting: Vascular Surgery

## 2015-06-17 DIAGNOSIS — K219 Gastro-esophageal reflux disease without esophagitis: Secondary | ICD-10-CM | POA: Insufficient documentation

## 2015-06-17 DIAGNOSIS — I10 Essential (primary) hypertension: Secondary | ICD-10-CM | POA: Insufficient documentation

## 2015-06-17 DIAGNOSIS — Z9889 Other specified postprocedural states: Secondary | ICD-10-CM

## 2015-06-17 DIAGNOSIS — I6522 Occlusion and stenosis of left carotid artery: Secondary | ICD-10-CM | POA: Diagnosis not present

## 2015-07-21 ENCOUNTER — Other Ambulatory Visit: Payer: Self-pay | Admitting: Gastroenterology

## 2015-07-21 DIAGNOSIS — D124 Benign neoplasm of descending colon: Secondary | ICD-10-CM | POA: Diagnosis not present

## 2015-07-21 DIAGNOSIS — Z1211 Encounter for screening for malignant neoplasm of colon: Secondary | ICD-10-CM | POA: Diagnosis not present

## 2015-07-21 DIAGNOSIS — K5730 Diverticulosis of large intestine without perforation or abscess without bleeding: Secondary | ICD-10-CM | POA: Diagnosis not present

## 2015-07-21 DIAGNOSIS — K644 Residual hemorrhoidal skin tags: Secondary | ICD-10-CM | POA: Diagnosis not present

## 2015-07-21 DIAGNOSIS — K648 Other hemorrhoids: Secondary | ICD-10-CM | POA: Diagnosis not present

## 2015-07-21 DIAGNOSIS — D126 Benign neoplasm of colon, unspecified: Secondary | ICD-10-CM | POA: Diagnosis not present

## 2015-08-17 DIAGNOSIS — M1A09X0 Idiopathic chronic gout, multiple sites, without tophus (tophi): Secondary | ICD-10-CM | POA: Diagnosis not present

## 2015-08-17 DIAGNOSIS — M353 Polymyalgia rheumatica: Secondary | ICD-10-CM | POA: Diagnosis not present

## 2015-08-17 DIAGNOSIS — Z7952 Long term (current) use of systemic steroids: Secondary | ICD-10-CM | POA: Diagnosis not present

## 2015-08-18 DIAGNOSIS — H2512 Age-related nuclear cataract, left eye: Secondary | ICD-10-CM | POA: Diagnosis not present

## 2015-08-18 DIAGNOSIS — H524 Presbyopia: Secondary | ICD-10-CM | POA: Diagnosis not present

## 2015-08-26 DIAGNOSIS — G4733 Obstructive sleep apnea (adult) (pediatric): Secondary | ICD-10-CM | POA: Diagnosis not present

## 2015-10-29 DIAGNOSIS — Z23 Encounter for immunization: Secondary | ICD-10-CM | POA: Diagnosis not present

## 2015-11-21 DIAGNOSIS — G4733 Obstructive sleep apnea (adult) (pediatric): Secondary | ICD-10-CM | POA: Diagnosis not present

## 2015-12-13 DIAGNOSIS — L821 Other seborrheic keratosis: Secondary | ICD-10-CM | POA: Diagnosis not present

## 2015-12-13 DIAGNOSIS — Z85828 Personal history of other malignant neoplasm of skin: Secondary | ICD-10-CM | POA: Diagnosis not present

## 2015-12-13 DIAGNOSIS — C44729 Squamous cell carcinoma of skin of left lower limb, including hip: Secondary | ICD-10-CM | POA: Diagnosis not present

## 2015-12-13 DIAGNOSIS — C44319 Basal cell carcinoma of skin of other parts of face: Secondary | ICD-10-CM | POA: Diagnosis not present

## 2015-12-13 DIAGNOSIS — D1801 Hemangioma of skin and subcutaneous tissue: Secondary | ICD-10-CM | POA: Diagnosis not present

## 2015-12-13 DIAGNOSIS — D235 Other benign neoplasm of skin of trunk: Secondary | ICD-10-CM | POA: Diagnosis not present

## 2015-12-13 DIAGNOSIS — L570 Actinic keratosis: Secondary | ICD-10-CM | POA: Diagnosis not present

## 2015-12-13 DIAGNOSIS — D485 Neoplasm of uncertain behavior of skin: Secondary | ICD-10-CM | POA: Diagnosis not present

## 2015-12-19 DIAGNOSIS — Z6828 Body mass index (BMI) 28.0-28.9, adult: Secondary | ICD-10-CM | POA: Diagnosis not present

## 2015-12-19 DIAGNOSIS — E663 Overweight: Secondary | ICD-10-CM | POA: Diagnosis not present

## 2015-12-19 DIAGNOSIS — M1A09X0 Idiopathic chronic gout, multiple sites, without tophus (tophi): Secondary | ICD-10-CM | POA: Diagnosis not present

## 2015-12-19 DIAGNOSIS — M353 Polymyalgia rheumatica: Secondary | ICD-10-CM | POA: Diagnosis not present

## 2015-12-19 DIAGNOSIS — Z7952 Long term (current) use of systemic steroids: Secondary | ICD-10-CM | POA: Diagnosis not present

## 2016-01-04 DIAGNOSIS — Z85828 Personal history of other malignant neoplasm of skin: Secondary | ICD-10-CM | POA: Diagnosis not present

## 2016-01-04 DIAGNOSIS — C44319 Basal cell carcinoma of skin of other parts of face: Secondary | ICD-10-CM | POA: Diagnosis not present

## 2016-01-26 DIAGNOSIS — M859 Disorder of bone density and structure, unspecified: Secondary | ICD-10-CM | POA: Diagnosis not present

## 2016-02-29 DIAGNOSIS — Z125 Encounter for screening for malignant neoplasm of prostate: Secondary | ICD-10-CM | POA: Diagnosis not present

## 2016-02-29 DIAGNOSIS — M859 Disorder of bone density and structure, unspecified: Secondary | ICD-10-CM | POA: Diagnosis not present

## 2016-02-29 DIAGNOSIS — R7301 Impaired fasting glucose: Secondary | ICD-10-CM | POA: Diagnosis not present

## 2016-02-29 DIAGNOSIS — D538 Other specified nutritional anemias: Secondary | ICD-10-CM | POA: Diagnosis not present

## 2016-02-29 DIAGNOSIS — Z0000 Encounter for general adult medical examination without abnormal findings: Secondary | ICD-10-CM | POA: Diagnosis not present

## 2016-02-29 DIAGNOSIS — M109 Gout, unspecified: Secondary | ICD-10-CM | POA: Diagnosis not present

## 2016-02-29 DIAGNOSIS — E784 Other hyperlipidemia: Secondary | ICD-10-CM | POA: Diagnosis not present

## 2016-03-07 DIAGNOSIS — R972 Elevated prostate specific antigen [PSA]: Secondary | ICD-10-CM | POA: Diagnosis not present

## 2016-03-07 DIAGNOSIS — M859 Disorder of bone density and structure, unspecified: Secondary | ICD-10-CM | POA: Diagnosis not present

## 2016-03-07 DIAGNOSIS — I10 Essential (primary) hypertension: Secondary | ICD-10-CM | POA: Diagnosis not present

## 2016-03-07 DIAGNOSIS — Z1389 Encounter for screening for other disorder: Secondary | ICD-10-CM | POA: Diagnosis not present

## 2016-03-07 DIAGNOSIS — E784 Other hyperlipidemia: Secondary | ICD-10-CM | POA: Diagnosis not present

## 2016-03-07 DIAGNOSIS — I7389 Other specified peripheral vascular diseases: Secondary | ICD-10-CM | POA: Diagnosis not present

## 2016-03-07 DIAGNOSIS — R7301 Impaired fasting glucose: Secondary | ICD-10-CM | POA: Diagnosis not present

## 2016-03-07 DIAGNOSIS — Z6827 Body mass index (BMI) 27.0-27.9, adult: Secondary | ICD-10-CM | POA: Diagnosis not present

## 2016-03-07 DIAGNOSIS — Z0000 Encounter for general adult medical examination without abnormal findings: Secondary | ICD-10-CM | POA: Diagnosis not present

## 2016-03-07 DIAGNOSIS — M109 Gout, unspecified: Secondary | ICD-10-CM | POA: Diagnosis not present

## 2016-03-07 DIAGNOSIS — D72820 Lymphocytosis (symptomatic): Secondary | ICD-10-CM | POA: Diagnosis not present

## 2016-05-09 DIAGNOSIS — M9902 Segmental and somatic dysfunction of thoracic region: Secondary | ICD-10-CM | POA: Diagnosis not present

## 2016-05-09 DIAGNOSIS — M9901 Segmental and somatic dysfunction of cervical region: Secondary | ICD-10-CM | POA: Diagnosis not present

## 2016-05-09 DIAGNOSIS — M50323 Other cervical disc degeneration at C6-C7 level: Secondary | ICD-10-CM | POA: Diagnosis not present

## 2016-05-09 DIAGNOSIS — M5383 Other specified dorsopathies, cervicothoracic region: Secondary | ICD-10-CM | POA: Diagnosis not present

## 2016-05-09 DIAGNOSIS — M4123 Other idiopathic scoliosis, cervicothoracic region: Secondary | ICD-10-CM | POA: Diagnosis not present

## 2016-05-14 DIAGNOSIS — M5383 Other specified dorsopathies, cervicothoracic region: Secondary | ICD-10-CM | POA: Diagnosis not present

## 2016-05-14 DIAGNOSIS — M50323 Other cervical disc degeneration at C6-C7 level: Secondary | ICD-10-CM | POA: Diagnosis not present

## 2016-05-14 DIAGNOSIS — M9902 Segmental and somatic dysfunction of thoracic region: Secondary | ICD-10-CM | POA: Diagnosis not present

## 2016-05-14 DIAGNOSIS — M9901 Segmental and somatic dysfunction of cervical region: Secondary | ICD-10-CM | POA: Diagnosis not present

## 2016-05-14 DIAGNOSIS — M4123 Other idiopathic scoliosis, cervicothoracic region: Secondary | ICD-10-CM | POA: Diagnosis not present

## 2016-05-15 DIAGNOSIS — M5383 Other specified dorsopathies, cervicothoracic region: Secondary | ICD-10-CM | POA: Diagnosis not present

## 2016-05-15 DIAGNOSIS — M9901 Segmental and somatic dysfunction of cervical region: Secondary | ICD-10-CM | POA: Diagnosis not present

## 2016-05-15 DIAGNOSIS — M4123 Other idiopathic scoliosis, cervicothoracic region: Secondary | ICD-10-CM | POA: Diagnosis not present

## 2016-05-15 DIAGNOSIS — M50323 Other cervical disc degeneration at C6-C7 level: Secondary | ICD-10-CM | POA: Diagnosis not present

## 2016-05-15 DIAGNOSIS — M9902 Segmental and somatic dysfunction of thoracic region: Secondary | ICD-10-CM | POA: Diagnosis not present

## 2016-05-17 DIAGNOSIS — M4123 Other idiopathic scoliosis, cervicothoracic region: Secondary | ICD-10-CM | POA: Diagnosis not present

## 2016-05-17 DIAGNOSIS — M9902 Segmental and somatic dysfunction of thoracic region: Secondary | ICD-10-CM | POA: Diagnosis not present

## 2016-05-17 DIAGNOSIS — M5383 Other specified dorsopathies, cervicothoracic region: Secondary | ICD-10-CM | POA: Diagnosis not present

## 2016-05-17 DIAGNOSIS — M9901 Segmental and somatic dysfunction of cervical region: Secondary | ICD-10-CM | POA: Diagnosis not present

## 2016-05-17 DIAGNOSIS — M50323 Other cervical disc degeneration at C6-C7 level: Secondary | ICD-10-CM | POA: Diagnosis not present

## 2016-05-21 DIAGNOSIS — M9901 Segmental and somatic dysfunction of cervical region: Secondary | ICD-10-CM | POA: Diagnosis not present

## 2016-05-21 DIAGNOSIS — M50323 Other cervical disc degeneration at C6-C7 level: Secondary | ICD-10-CM | POA: Diagnosis not present

## 2016-05-21 DIAGNOSIS — M5383 Other specified dorsopathies, cervicothoracic region: Secondary | ICD-10-CM | POA: Diagnosis not present

## 2016-05-21 DIAGNOSIS — M4123 Other idiopathic scoliosis, cervicothoracic region: Secondary | ICD-10-CM | POA: Diagnosis not present

## 2016-05-21 DIAGNOSIS — M9902 Segmental and somatic dysfunction of thoracic region: Secondary | ICD-10-CM | POA: Diagnosis not present

## 2016-05-22 DIAGNOSIS — M50323 Other cervical disc degeneration at C6-C7 level: Secondary | ICD-10-CM | POA: Diagnosis not present

## 2016-05-22 DIAGNOSIS — M5383 Other specified dorsopathies, cervicothoracic region: Secondary | ICD-10-CM | POA: Diagnosis not present

## 2016-05-22 DIAGNOSIS — M9902 Segmental and somatic dysfunction of thoracic region: Secondary | ICD-10-CM | POA: Diagnosis not present

## 2016-05-22 DIAGNOSIS — M9901 Segmental and somatic dysfunction of cervical region: Secondary | ICD-10-CM | POA: Diagnosis not present

## 2016-05-22 DIAGNOSIS — M4123 Other idiopathic scoliosis, cervicothoracic region: Secondary | ICD-10-CM | POA: Diagnosis not present

## 2016-05-28 DIAGNOSIS — M5383 Other specified dorsopathies, cervicothoracic region: Secondary | ICD-10-CM | POA: Diagnosis not present

## 2016-05-28 DIAGNOSIS — M9902 Segmental and somatic dysfunction of thoracic region: Secondary | ICD-10-CM | POA: Diagnosis not present

## 2016-05-28 DIAGNOSIS — M50323 Other cervical disc degeneration at C6-C7 level: Secondary | ICD-10-CM | POA: Diagnosis not present

## 2016-05-28 DIAGNOSIS — M9901 Segmental and somatic dysfunction of cervical region: Secondary | ICD-10-CM | POA: Diagnosis not present

## 2016-05-28 DIAGNOSIS — M4123 Other idiopathic scoliosis, cervicothoracic region: Secondary | ICD-10-CM | POA: Diagnosis not present

## 2016-05-29 DIAGNOSIS — M4123 Other idiopathic scoliosis, cervicothoracic region: Secondary | ICD-10-CM | POA: Diagnosis not present

## 2016-05-29 DIAGNOSIS — M9902 Segmental and somatic dysfunction of thoracic region: Secondary | ICD-10-CM | POA: Diagnosis not present

## 2016-05-29 DIAGNOSIS — M50323 Other cervical disc degeneration at C6-C7 level: Secondary | ICD-10-CM | POA: Diagnosis not present

## 2016-05-29 DIAGNOSIS — M5383 Other specified dorsopathies, cervicothoracic region: Secondary | ICD-10-CM | POA: Diagnosis not present

## 2016-05-29 DIAGNOSIS — M9901 Segmental and somatic dysfunction of cervical region: Secondary | ICD-10-CM | POA: Diagnosis not present

## 2016-06-04 DIAGNOSIS — M9901 Segmental and somatic dysfunction of cervical region: Secondary | ICD-10-CM | POA: Diagnosis not present

## 2016-06-04 DIAGNOSIS — M5383 Other specified dorsopathies, cervicothoracic region: Secondary | ICD-10-CM | POA: Diagnosis not present

## 2016-06-04 DIAGNOSIS — M4123 Other idiopathic scoliosis, cervicothoracic region: Secondary | ICD-10-CM | POA: Diagnosis not present

## 2016-06-04 DIAGNOSIS — M50323 Other cervical disc degeneration at C6-C7 level: Secondary | ICD-10-CM | POA: Diagnosis not present

## 2016-06-04 DIAGNOSIS — M9902 Segmental and somatic dysfunction of thoracic region: Secondary | ICD-10-CM | POA: Diagnosis not present

## 2016-06-07 DIAGNOSIS — M9902 Segmental and somatic dysfunction of thoracic region: Secondary | ICD-10-CM | POA: Diagnosis not present

## 2016-06-07 DIAGNOSIS — M4123 Other idiopathic scoliosis, cervicothoracic region: Secondary | ICD-10-CM | POA: Diagnosis not present

## 2016-06-07 DIAGNOSIS — M9901 Segmental and somatic dysfunction of cervical region: Secondary | ICD-10-CM | POA: Diagnosis not present

## 2016-06-07 DIAGNOSIS — M50323 Other cervical disc degeneration at C6-C7 level: Secondary | ICD-10-CM | POA: Diagnosis not present

## 2016-06-07 DIAGNOSIS — M5383 Other specified dorsopathies, cervicothoracic region: Secondary | ICD-10-CM | POA: Diagnosis not present

## 2016-06-12 DIAGNOSIS — M9902 Segmental and somatic dysfunction of thoracic region: Secondary | ICD-10-CM | POA: Diagnosis not present

## 2016-06-12 DIAGNOSIS — M50323 Other cervical disc degeneration at C6-C7 level: Secondary | ICD-10-CM | POA: Diagnosis not present

## 2016-06-12 DIAGNOSIS — M9901 Segmental and somatic dysfunction of cervical region: Secondary | ICD-10-CM | POA: Diagnosis not present

## 2016-06-12 DIAGNOSIS — M4123 Other idiopathic scoliosis, cervicothoracic region: Secondary | ICD-10-CM | POA: Diagnosis not present

## 2016-06-12 DIAGNOSIS — M5383 Other specified dorsopathies, cervicothoracic region: Secondary | ICD-10-CM | POA: Diagnosis not present

## 2016-06-14 DIAGNOSIS — M50323 Other cervical disc degeneration at C6-C7 level: Secondary | ICD-10-CM | POA: Diagnosis not present

## 2016-06-14 DIAGNOSIS — M5383 Other specified dorsopathies, cervicothoracic region: Secondary | ICD-10-CM | POA: Diagnosis not present

## 2016-06-14 DIAGNOSIS — M9902 Segmental and somatic dysfunction of thoracic region: Secondary | ICD-10-CM | POA: Diagnosis not present

## 2016-06-14 DIAGNOSIS — M4123 Other idiopathic scoliosis, cervicothoracic region: Secondary | ICD-10-CM | POA: Diagnosis not present

## 2016-06-14 DIAGNOSIS — M9901 Segmental and somatic dysfunction of cervical region: Secondary | ICD-10-CM | POA: Diagnosis not present

## 2016-06-18 ENCOUNTER — Ambulatory Visit: Payer: PPO | Admitting: Neurology

## 2016-06-18 DIAGNOSIS — Z7952 Long term (current) use of systemic steroids: Secondary | ICD-10-CM | POA: Diagnosis not present

## 2016-06-18 DIAGNOSIS — E663 Overweight: Secondary | ICD-10-CM | POA: Diagnosis not present

## 2016-06-18 DIAGNOSIS — M353 Polymyalgia rheumatica: Secondary | ICD-10-CM | POA: Diagnosis not present

## 2016-06-18 DIAGNOSIS — Z6828 Body mass index (BMI) 28.0-28.9, adult: Secondary | ICD-10-CM | POA: Diagnosis not present

## 2016-06-18 DIAGNOSIS — M1A09X0 Idiopathic chronic gout, multiple sites, without tophus (tophi): Secondary | ICD-10-CM | POA: Diagnosis not present

## 2016-06-19 DIAGNOSIS — M5383 Other specified dorsopathies, cervicothoracic region: Secondary | ICD-10-CM | POA: Diagnosis not present

## 2016-06-19 DIAGNOSIS — M50323 Other cervical disc degeneration at C6-C7 level: Secondary | ICD-10-CM | POA: Diagnosis not present

## 2016-06-19 DIAGNOSIS — M9901 Segmental and somatic dysfunction of cervical region: Secondary | ICD-10-CM | POA: Diagnosis not present

## 2016-06-19 DIAGNOSIS — M4123 Other idiopathic scoliosis, cervicothoracic region: Secondary | ICD-10-CM | POA: Diagnosis not present

## 2016-06-19 DIAGNOSIS — M9902 Segmental and somatic dysfunction of thoracic region: Secondary | ICD-10-CM | POA: Diagnosis not present

## 2016-06-26 DIAGNOSIS — M47812 Spondylosis without myelopathy or radiculopathy, cervical region: Secondary | ICD-10-CM | POA: Diagnosis not present

## 2016-06-26 DIAGNOSIS — M4123 Other idiopathic scoliosis, cervicothoracic region: Secondary | ICD-10-CM | POA: Diagnosis not present

## 2016-06-26 DIAGNOSIS — M5383 Other specified dorsopathies, cervicothoracic region: Secondary | ICD-10-CM | POA: Diagnosis not present

## 2016-06-26 DIAGNOSIS — M50323 Other cervical disc degeneration at C6-C7 level: Secondary | ICD-10-CM | POA: Diagnosis not present

## 2016-06-26 DIAGNOSIS — M9901 Segmental and somatic dysfunction of cervical region: Secondary | ICD-10-CM | POA: Diagnosis not present

## 2016-06-26 DIAGNOSIS — M9902 Segmental and somatic dysfunction of thoracic region: Secondary | ICD-10-CM | POA: Diagnosis not present

## 2016-07-04 DIAGNOSIS — G4733 Obstructive sleep apnea (adult) (pediatric): Secondary | ICD-10-CM | POA: Diagnosis not present

## 2016-07-05 DIAGNOSIS — M5383 Other specified dorsopathies, cervicothoracic region: Secondary | ICD-10-CM | POA: Diagnosis not present

## 2016-07-05 DIAGNOSIS — M9902 Segmental and somatic dysfunction of thoracic region: Secondary | ICD-10-CM | POA: Diagnosis not present

## 2016-07-05 DIAGNOSIS — M9901 Segmental and somatic dysfunction of cervical region: Secondary | ICD-10-CM | POA: Diagnosis not present

## 2016-07-05 DIAGNOSIS — M4123 Other idiopathic scoliosis, cervicothoracic region: Secondary | ICD-10-CM | POA: Diagnosis not present

## 2016-07-05 DIAGNOSIS — M50323 Other cervical disc degeneration at C6-C7 level: Secondary | ICD-10-CM | POA: Diagnosis not present

## 2016-07-12 DIAGNOSIS — M9902 Segmental and somatic dysfunction of thoracic region: Secondary | ICD-10-CM | POA: Diagnosis not present

## 2016-07-12 DIAGNOSIS — M5383 Other specified dorsopathies, cervicothoracic region: Secondary | ICD-10-CM | POA: Diagnosis not present

## 2016-07-12 DIAGNOSIS — M50323 Other cervical disc degeneration at C6-C7 level: Secondary | ICD-10-CM | POA: Diagnosis not present

## 2016-07-12 DIAGNOSIS — M9901 Segmental and somatic dysfunction of cervical region: Secondary | ICD-10-CM | POA: Diagnosis not present

## 2016-07-12 DIAGNOSIS — M4123 Other idiopathic scoliosis, cervicothoracic region: Secondary | ICD-10-CM | POA: Diagnosis not present

## 2016-07-30 DIAGNOSIS — M50323 Other cervical disc degeneration at C6-C7 level: Secondary | ICD-10-CM | POA: Diagnosis not present

## 2016-07-30 DIAGNOSIS — M9901 Segmental and somatic dysfunction of cervical region: Secondary | ICD-10-CM | POA: Diagnosis not present

## 2016-07-30 DIAGNOSIS — M5383 Other specified dorsopathies, cervicothoracic region: Secondary | ICD-10-CM | POA: Diagnosis not present

## 2016-07-30 DIAGNOSIS — M9902 Segmental and somatic dysfunction of thoracic region: Secondary | ICD-10-CM | POA: Diagnosis not present

## 2016-07-30 DIAGNOSIS — M4123 Other idiopathic scoliosis, cervicothoracic region: Secondary | ICD-10-CM | POA: Diagnosis not present

## 2016-08-13 DIAGNOSIS — M4123 Other idiopathic scoliosis, cervicothoracic region: Secondary | ICD-10-CM | POA: Diagnosis not present

## 2016-08-13 DIAGNOSIS — M50323 Other cervical disc degeneration at C6-C7 level: Secondary | ICD-10-CM | POA: Diagnosis not present

## 2016-08-13 DIAGNOSIS — M9901 Segmental and somatic dysfunction of cervical region: Secondary | ICD-10-CM | POA: Diagnosis not present

## 2016-08-13 DIAGNOSIS — M9902 Segmental and somatic dysfunction of thoracic region: Secondary | ICD-10-CM | POA: Diagnosis not present

## 2016-08-13 DIAGNOSIS — M5383 Other specified dorsopathies, cervicothoracic region: Secondary | ICD-10-CM | POA: Diagnosis not present

## 2016-08-24 DIAGNOSIS — H2512 Age-related nuclear cataract, left eye: Secondary | ICD-10-CM | POA: Diagnosis not present

## 2016-08-24 DIAGNOSIS — H5211 Myopia, right eye: Secondary | ICD-10-CM | POA: Diagnosis not present

## 2016-08-24 DIAGNOSIS — H31001 Unspecified chorioretinal scars, right eye: Secondary | ICD-10-CM | POA: Diagnosis not present

## 2016-08-27 DIAGNOSIS — M50323 Other cervical disc degeneration at C6-C7 level: Secondary | ICD-10-CM | POA: Diagnosis not present

## 2016-08-27 DIAGNOSIS — M9901 Segmental and somatic dysfunction of cervical region: Secondary | ICD-10-CM | POA: Diagnosis not present

## 2016-08-27 DIAGNOSIS — M5383 Other specified dorsopathies, cervicothoracic region: Secondary | ICD-10-CM | POA: Diagnosis not present

## 2016-08-27 DIAGNOSIS — M4123 Other idiopathic scoliosis, cervicothoracic region: Secondary | ICD-10-CM | POA: Diagnosis not present

## 2016-08-27 DIAGNOSIS — M9902 Segmental and somatic dysfunction of thoracic region: Secondary | ICD-10-CM | POA: Diagnosis not present

## 2016-09-18 DIAGNOSIS — M5383 Other specified dorsopathies, cervicothoracic region: Secondary | ICD-10-CM | POA: Diagnosis not present

## 2016-09-18 DIAGNOSIS — M9901 Segmental and somatic dysfunction of cervical region: Secondary | ICD-10-CM | POA: Diagnosis not present

## 2016-09-18 DIAGNOSIS — M4123 Other idiopathic scoliosis, cervicothoracic region: Secondary | ICD-10-CM | POA: Diagnosis not present

## 2016-09-18 DIAGNOSIS — M9902 Segmental and somatic dysfunction of thoracic region: Secondary | ICD-10-CM | POA: Diagnosis not present

## 2016-09-18 DIAGNOSIS — M50323 Other cervical disc degeneration at C6-C7 level: Secondary | ICD-10-CM | POA: Diagnosis not present

## 2016-09-26 ENCOUNTER — Ambulatory Visit: Payer: PPO | Admitting: Neurology

## 2016-10-13 DIAGNOSIS — Z23 Encounter for immunization: Secondary | ICD-10-CM | POA: Diagnosis not present

## 2016-11-07 DIAGNOSIS — G4733 Obstructive sleep apnea (adult) (pediatric): Secondary | ICD-10-CM | POA: Diagnosis not present

## 2016-11-27 DIAGNOSIS — Z85828 Personal history of other malignant neoplasm of skin: Secondary | ICD-10-CM | POA: Diagnosis not present

## 2016-11-27 DIAGNOSIS — D1801 Hemangioma of skin and subcutaneous tissue: Secondary | ICD-10-CM | POA: Diagnosis not present

## 2016-11-27 DIAGNOSIS — L570 Actinic keratosis: Secondary | ICD-10-CM | POA: Diagnosis not present

## 2016-11-27 DIAGNOSIS — B078 Other viral warts: Secondary | ICD-10-CM | POA: Diagnosis not present

## 2016-11-27 DIAGNOSIS — L821 Other seborrheic keratosis: Secondary | ICD-10-CM | POA: Diagnosis not present

## 2016-11-27 DIAGNOSIS — D485 Neoplasm of uncertain behavior of skin: Secondary | ICD-10-CM | POA: Diagnosis not present

## 2017-01-31 ENCOUNTER — Ambulatory Visit: Payer: PPO | Admitting: Neurology

## 2017-01-31 DIAGNOSIS — L820 Inflamed seborrheic keratosis: Secondary | ICD-10-CM | POA: Diagnosis not present

## 2017-01-31 DIAGNOSIS — L821 Other seborrheic keratosis: Secondary | ICD-10-CM | POA: Diagnosis not present

## 2017-01-31 DIAGNOSIS — Z85828 Personal history of other malignant neoplasm of skin: Secondary | ICD-10-CM | POA: Diagnosis not present

## 2017-02-11 ENCOUNTER — Encounter: Payer: Self-pay | Admitting: Neurology

## 2017-03-20 DIAGNOSIS — R05 Cough: Secondary | ICD-10-CM | POA: Diagnosis not present

## 2017-03-20 DIAGNOSIS — Z6827 Body mass index (BMI) 27.0-27.9, adult: Secondary | ICD-10-CM | POA: Diagnosis not present

## 2017-03-20 DIAGNOSIS — J45998 Other asthma: Secondary | ICD-10-CM | POA: Diagnosis not present

## 2017-03-26 DIAGNOSIS — Z125 Encounter for screening for malignant neoplasm of prostate: Secondary | ICD-10-CM | POA: Diagnosis not present

## 2017-03-26 DIAGNOSIS — R7301 Impaired fasting glucose: Secondary | ICD-10-CM | POA: Diagnosis not present

## 2017-03-26 DIAGNOSIS — E7849 Other hyperlipidemia: Secondary | ICD-10-CM | POA: Diagnosis not present

## 2017-03-26 DIAGNOSIS — M859 Disorder of bone density and structure, unspecified: Secondary | ICD-10-CM | POA: Diagnosis not present

## 2017-03-26 DIAGNOSIS — R82998 Other abnormal findings in urine: Secondary | ICD-10-CM | POA: Diagnosis not present

## 2017-03-26 DIAGNOSIS — I10 Essential (primary) hypertension: Secondary | ICD-10-CM | POA: Diagnosis not present

## 2017-03-26 DIAGNOSIS — M109 Gout, unspecified: Secondary | ICD-10-CM | POA: Diagnosis not present

## 2017-04-01 DIAGNOSIS — G4733 Obstructive sleep apnea (adult) (pediatric): Secondary | ICD-10-CM | POA: Diagnosis not present

## 2017-04-01 DIAGNOSIS — I10 Essential (primary) hypertension: Secondary | ICD-10-CM | POA: Diagnosis not present

## 2017-04-01 DIAGNOSIS — N401 Enlarged prostate with lower urinary tract symptoms: Secondary | ICD-10-CM | POA: Diagnosis not present

## 2017-04-01 DIAGNOSIS — R972 Elevated prostate specific antigen [PSA]: Secondary | ICD-10-CM | POA: Diagnosis not present

## 2017-04-01 DIAGNOSIS — Z1389 Encounter for screening for other disorder: Secondary | ICD-10-CM | POA: Diagnosis not present

## 2017-04-01 DIAGNOSIS — I7389 Other specified peripheral vascular diseases: Secondary | ICD-10-CM | POA: Diagnosis not present

## 2017-04-01 DIAGNOSIS — M2550 Pain in unspecified joint: Secondary | ICD-10-CM | POA: Diagnosis not present

## 2017-04-01 DIAGNOSIS — D72820 Lymphocytosis (symptomatic): Secondary | ICD-10-CM | POA: Diagnosis not present

## 2017-04-01 DIAGNOSIS — J45998 Other asthma: Secondary | ICD-10-CM | POA: Diagnosis not present

## 2017-04-01 DIAGNOSIS — Z6827 Body mass index (BMI) 27.0-27.9, adult: Secondary | ICD-10-CM | POA: Diagnosis not present

## 2017-04-01 DIAGNOSIS — M859 Disorder of bone density and structure, unspecified: Secondary | ICD-10-CM | POA: Diagnosis not present

## 2017-04-01 DIAGNOSIS — Z0000 Encounter for general adult medical examination without abnormal findings: Secondary | ICD-10-CM | POA: Diagnosis not present

## 2017-04-04 DIAGNOSIS — Z1212 Encounter for screening for malignant neoplasm of rectum: Secondary | ICD-10-CM | POA: Diagnosis not present

## 2017-04-22 DIAGNOSIS — D72820 Lymphocytosis (symptomatic): Secondary | ICD-10-CM | POA: Diagnosis not present

## 2017-04-23 ENCOUNTER — Ambulatory Visit: Payer: PPO | Admitting: Neurology

## 2017-04-26 ENCOUNTER — Telehealth: Payer: Self-pay | Admitting: Hematology and Oncology

## 2017-04-26 NOTE — Telephone Encounter (Signed)
Spoke with patients wife regarding appointment date/time/location/phone number. Referring office also contacted  She will have him call back to confirm this appt

## 2017-05-14 ENCOUNTER — Other Ambulatory Visit: Payer: Self-pay

## 2017-05-14 ENCOUNTER — Encounter: Payer: Self-pay | Admitting: Hematology and Oncology

## 2017-05-14 ENCOUNTER — Telehealth: Payer: Self-pay | Admitting: Hematology and Oncology

## 2017-05-14 ENCOUNTER — Inpatient Hospital Stay: Payer: PPO | Attending: Hematology and Oncology | Admitting: Hematology and Oncology

## 2017-05-14 ENCOUNTER — Inpatient Hospital Stay: Payer: PPO

## 2017-05-14 VITALS — BP 152/80 | HR 79 | Temp 98.6°F | Resp 18 | Ht 69.5 in | Wt 187.4 lb

## 2017-05-14 DIAGNOSIS — N189 Chronic kidney disease, unspecified: Secondary | ICD-10-CM | POA: Insufficient documentation

## 2017-05-14 DIAGNOSIS — I129 Hypertensive chronic kidney disease with stage 1 through stage 4 chronic kidney disease, or unspecified chronic kidney disease: Secondary | ICD-10-CM | POA: Insufficient documentation

## 2017-05-14 DIAGNOSIS — M8580 Other specified disorders of bone density and structure, unspecified site: Secondary | ICD-10-CM | POA: Diagnosis not present

## 2017-05-14 DIAGNOSIS — Z79899 Other long term (current) drug therapy: Secondary | ICD-10-CM | POA: Diagnosis not present

## 2017-05-14 DIAGNOSIS — M109 Gout, unspecified: Secondary | ICD-10-CM | POA: Diagnosis not present

## 2017-05-14 DIAGNOSIS — D72820 Lymphocytosis (symptomatic): Secondary | ICD-10-CM | POA: Diagnosis not present

## 2017-05-14 DIAGNOSIS — Z87442 Personal history of urinary calculi: Secondary | ICD-10-CM | POA: Diagnosis not present

## 2017-05-14 DIAGNOSIS — I739 Peripheral vascular disease, unspecified: Secondary | ICD-10-CM | POA: Insufficient documentation

## 2017-05-14 DIAGNOSIS — K219 Gastro-esophageal reflux disease without esophagitis: Secondary | ICD-10-CM | POA: Diagnosis not present

## 2017-05-14 LAB — CMP (CANCER CENTER ONLY)
ALK PHOS: 88 U/L (ref 40–150)
ALT: 24 U/L (ref 0–55)
AST: 23 U/L (ref 5–34)
Albumin: 4.4 g/dL (ref 3.5–5.0)
Anion gap: 7 (ref 3–11)
BILIRUBIN TOTAL: 1.1 mg/dL (ref 0.2–1.2)
BUN: 21 mg/dL (ref 7–26)
CALCIUM: 9.8 mg/dL (ref 8.4–10.4)
CO2: 28 mmol/L (ref 22–29)
CREATININE: 1.06 mg/dL (ref 0.70–1.30)
Chloride: 105 mmol/L (ref 98–109)
Glucose, Bld: 101 mg/dL (ref 70–140)
Potassium: 4.1 mmol/L (ref 3.5–5.1)
SODIUM: 140 mmol/L (ref 136–145)
Total Protein: 6.9 g/dL (ref 6.4–8.3)

## 2017-05-14 LAB — CBC WITH DIFFERENTIAL (CANCER CENTER ONLY)
BASOS ABS: 0 10*3/uL (ref 0.0–0.1)
Basophils Relative: 0 %
EOS ABS: 0.2 10*3/uL (ref 0.0–0.5)
EOS PCT: 2 %
HCT: 43.8 % (ref 38.4–49.9)
Hemoglobin: 14.7 g/dL (ref 13.0–17.1)
LYMPHS ABS: 4.9 10*3/uL — AB (ref 0.9–3.3)
Lymphocytes Relative: 47 %
MCH: 33.3 pg (ref 27.2–33.4)
MCHC: 33.6 g/dL (ref 32.0–36.0)
MCV: 99.1 fL — ABNORMAL HIGH (ref 79.3–98.0)
Monocytes Absolute: 0.6 10*3/uL (ref 0.1–0.9)
Monocytes Relative: 5 %
Neutro Abs: 4.7 10*3/uL (ref 1.5–6.5)
Neutrophils Relative %: 46 %
PLATELETS: 163 10*3/uL (ref 140–400)
RBC: 4.42 MIL/uL (ref 4.20–5.82)
RDW: 13.4 % (ref 11.0–14.6)
WBC Count: 10.4 10*3/uL — ABNORMAL HIGH (ref 4.0–10.3)

## 2017-05-14 LAB — URIC ACID: Uric Acid, Serum: 4.5 mg/dL (ref 2.6–7.4)

## 2017-05-14 LAB — LACTATE DEHYDROGENASE: LDH: 146 U/L (ref 125–245)

## 2017-05-14 NOTE — Telephone Encounter (Signed)
Appointments scheduled AVS/Calendar printed per 4/30 los °

## 2017-05-15 ENCOUNTER — Telehealth: Payer: Self-pay | Admitting: Hematology and Oncology

## 2017-05-15 LAB — BETA 2 MICROGLOBULIN, SERUM: BETA 2 MICROGLOBULIN: 1.8 mg/L (ref 0.6–2.4)

## 2017-05-15 LAB — IGG, IGA, IGM
IgA: 133 mg/dL (ref 61–437)
IgG (Immunoglobin G), Serum: 821 mg/dL (ref 700–1600)
IgM (Immunoglobulin M), Srm: 30 mg/dL (ref 15–143)

## 2017-05-15 NOTE — Telephone Encounter (Signed)
Spoke to patient regarding VM he left earlier to r/s appointment. Patients appointment r/s to later same day OK per nurse.

## 2017-05-16 LAB — FLOW CYTOMETRY

## 2017-05-20 ENCOUNTER — Encounter: Payer: Self-pay | Admitting: Neurology

## 2017-05-21 ENCOUNTER — Encounter: Payer: Self-pay | Admitting: Hematology and Oncology

## 2017-05-21 ENCOUNTER — Telehealth: Payer: Self-pay

## 2017-05-21 ENCOUNTER — Inpatient Hospital Stay: Payer: PPO | Attending: Hematology and Oncology | Admitting: Hematology and Oncology

## 2017-05-21 VITALS — BP 158/71 | HR 89 | Temp 97.8°F | Resp 18 | Ht 69.5 in | Wt 188.6 lb

## 2017-05-21 DIAGNOSIS — I129 Hypertensive chronic kidney disease with stage 1 through stage 4 chronic kidney disease, or unspecified chronic kidney disease: Secondary | ICD-10-CM | POA: Diagnosis not present

## 2017-05-21 DIAGNOSIS — N189 Chronic kidney disease, unspecified: Secondary | ICD-10-CM | POA: Insufficient documentation

## 2017-05-21 DIAGNOSIS — I739 Peripheral vascular disease, unspecified: Secondary | ICD-10-CM

## 2017-05-21 DIAGNOSIS — D72820 Lymphocytosis (symptomatic): Secondary | ICD-10-CM | POA: Diagnosis not present

## 2017-05-21 DIAGNOSIS — M109 Gout, unspecified: Secondary | ICD-10-CM | POA: Insufficient documentation

## 2017-05-21 DIAGNOSIS — K219 Gastro-esophageal reflux disease without esophagitis: Secondary | ICD-10-CM | POA: Diagnosis not present

## 2017-05-21 DIAGNOSIS — Z8719 Personal history of other diseases of the digestive system: Secondary | ICD-10-CM | POA: Insufficient documentation

## 2017-05-21 DIAGNOSIS — Z87442 Personal history of urinary calculi: Secondary | ICD-10-CM | POA: Insufficient documentation

## 2017-05-21 DIAGNOSIS — M8580 Other specified disorders of bone density and structure, unspecified site: Secondary | ICD-10-CM | POA: Insufficient documentation

## 2017-05-21 DIAGNOSIS — Z79899 Other long term (current) drug therapy: Secondary | ICD-10-CM | POA: Insufficient documentation

## 2017-05-21 DIAGNOSIS — Z794 Long term (current) use of insulin: Secondary | ICD-10-CM | POA: Diagnosis not present

## 2017-05-21 NOTE — Telephone Encounter (Signed)
Printed avs and calender of upcoming appointment. Per 5/7 los also gave patient contrast, instructions, and phone number to contact [facility] for sxcheduling

## 2017-05-22 ENCOUNTER — Encounter: Payer: Self-pay | Admitting: Neurology

## 2017-05-22 ENCOUNTER — Ambulatory Visit: Payer: PPO | Admitting: Neurology

## 2017-05-22 VITALS — BP 152/81 | HR 79 | Ht 69.5 in | Wt 188.0 lb

## 2017-05-22 DIAGNOSIS — G4733 Obstructive sleep apnea (adult) (pediatric): Secondary | ICD-10-CM | POA: Diagnosis not present

## 2017-05-22 DIAGNOSIS — Z9989 Dependence on other enabling machines and devices: Secondary | ICD-10-CM | POA: Diagnosis not present

## 2017-05-22 NOTE — Patient Instructions (Signed)
You do everything right and well now!  Keep using your CPAP.

## 2017-05-22 NOTE — Addendum Note (Signed)
Addended by: Larey Seat on: 05/22/2017 04:20 PM   Modules accepted: Orders
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
## 2017-05-23 ENCOUNTER — Ambulatory Visit: Payer: PPO | Admitting: Hematology

## 2017-05-28 ENCOUNTER — Encounter (HOSPITAL_COMMUNITY): Payer: Self-pay

## 2017-05-28 ENCOUNTER — Ambulatory Visit (HOSPITAL_COMMUNITY)
Admission: RE | Admit: 2017-05-28 | Discharge: 2017-05-28 | Disposition: A | Discharge status: Home / Self Care | Payer: PPO | Source: Ambulatory Visit | Attending: Hematology and Oncology | Admitting: Hematology and Oncology

## 2017-05-28 DIAGNOSIS — D72820 Lymphocytosis (symptomatic): Secondary | ICD-10-CM | POA: Diagnosis not present

## 2017-05-28 DIAGNOSIS — R221 Localized swelling, mass and lump, neck: Secondary | ICD-10-CM | POA: Diagnosis not present

## 2017-05-28 DIAGNOSIS — I700 Atherosclerosis of aorta: Secondary | ICD-10-CM | POA: Insufficient documentation

## 2017-05-28 DIAGNOSIS — I6522 Occlusion and stenosis of left carotid artery: Secondary | ICD-10-CM | POA: Insufficient documentation

## 2017-05-28 DIAGNOSIS — N400 Enlarged prostate without lower urinary tract symptoms: Secondary | ICD-10-CM | POA: Diagnosis not present

## 2017-05-28 DIAGNOSIS — I2510 Atherosclerotic heart disease of native coronary artery without angina pectoris: Secondary | ICD-10-CM | POA: Diagnosis not present

## 2017-05-28 DIAGNOSIS — J984 Other disorders of lung: Secondary | ICD-10-CM | POA: Diagnosis not present

## 2017-05-28 DIAGNOSIS — N200 Calculus of kidney: Secondary | ICD-10-CM | POA: Insufficient documentation

## 2017-05-28 MED ORDER — IOPAMIDOL (ISOVUE-300) INJECTION 61%
100.0000 mL | Freq: Once | INTRAVENOUS | Status: AC | PRN
  Administered 2017-05-28: 100 mL via INTRAVENOUS

## 2017-05-28 MED ORDER — IOPAMIDOL (ISOVUE-300) INJECTION 61%
INTRAVENOUS | Status: AC
  Filled 2017-05-28: qty 100

## 2017-05-29 DIAGNOSIS — G4733 Obstructive sleep apnea (adult) (pediatric): Secondary | ICD-10-CM | POA: Diagnosis not present

## 2017-06-02 ENCOUNTER — Emergency Department (HOSPITAL_COMMUNITY): Payer: PPO

## 2017-06-02 ENCOUNTER — Inpatient Hospital Stay (HOSPITAL_COMMUNITY)
Admission: EM | Admit: 2017-06-02 | Discharge: 2017-06-15 | DRG: 064 | Disposition: E | Payer: PPO | Attending: Neurology | Admitting: Neurology

## 2017-06-02 ENCOUNTER — Encounter (HOSPITAL_COMMUNITY): Payer: Self-pay | Admitting: Radiology

## 2017-06-02 ENCOUNTER — Other Ambulatory Visit: Payer: Self-pay

## 2017-06-02 DIAGNOSIS — R4701 Aphasia: Secondary | ICD-10-CM | POA: Diagnosis present

## 2017-06-02 DIAGNOSIS — E785 Hyperlipidemia, unspecified: Secondary | ICD-10-CM | POA: Diagnosis present

## 2017-06-02 DIAGNOSIS — R630 Anorexia: Secondary | ICD-10-CM | POA: Diagnosis not present

## 2017-06-02 DIAGNOSIS — I739 Peripheral vascular disease, unspecified: Secondary | ICD-10-CM | POA: Diagnosis present

## 2017-06-02 DIAGNOSIS — Z515 Encounter for palliative care: Secondary | ICD-10-CM | POA: Diagnosis not present

## 2017-06-02 DIAGNOSIS — I6522 Occlusion and stenosis of left carotid artery: Secondary | ICD-10-CM | POA: Diagnosis not present

## 2017-06-02 DIAGNOSIS — N179 Acute kidney failure, unspecified: Secondary | ICD-10-CM | POA: Diagnosis not present

## 2017-06-02 DIAGNOSIS — I639 Cerebral infarction, unspecified: Secondary | ICD-10-CM | POA: Diagnosis not present

## 2017-06-02 DIAGNOSIS — Z452 Encounter for adjustment and management of vascular access device: Secondary | ICD-10-CM

## 2017-06-02 DIAGNOSIS — R000 Tachycardia, unspecified: Secondary | ICD-10-CM | POA: Diagnosis not present

## 2017-06-02 DIAGNOSIS — G935 Compression of brain: Secondary | ICD-10-CM | POA: Diagnosis not present

## 2017-06-02 DIAGNOSIS — K219 Gastro-esophageal reflux disease without esophagitis: Secondary | ICD-10-CM | POA: Diagnosis present

## 2017-06-02 DIAGNOSIS — R29717 NIHSS score 17: Secondary | ICD-10-CM | POA: Diagnosis not present

## 2017-06-02 DIAGNOSIS — Z8719 Personal history of other diseases of the digestive system: Secondary | ICD-10-CM

## 2017-06-02 DIAGNOSIS — R6510 Systemic inflammatory response syndrome (SIRS) of non-infectious origin without acute organ dysfunction: Secondary | ICD-10-CM

## 2017-06-02 DIAGNOSIS — R0902 Hypoxemia: Secondary | ICD-10-CM | POA: Diagnosis not present

## 2017-06-02 DIAGNOSIS — G8191 Hemiplegia, unspecified affecting right dominant side: Secondary | ICD-10-CM | POA: Diagnosis present

## 2017-06-02 DIAGNOSIS — I69114 Frontal lobe and executive function deficit following nontraumatic intracerebral hemorrhage: Secondary | ICD-10-CM | POA: Diagnosis not present

## 2017-06-02 DIAGNOSIS — M109 Gout, unspecified: Secondary | ICD-10-CM | POA: Diagnosis present

## 2017-06-02 DIAGNOSIS — Z7189 Other specified counseling: Secondary | ICD-10-CM | POA: Diagnosis not present

## 2017-06-02 DIAGNOSIS — I611 Nontraumatic intracerebral hemorrhage in hemisphere, cortical: Principal | ICD-10-CM | POA: Diagnosis present

## 2017-06-02 DIAGNOSIS — E876 Hypokalemia: Secondary | ICD-10-CM | POA: Diagnosis not present

## 2017-06-02 DIAGNOSIS — D72829 Elevated white blood cell count, unspecified: Secondary | ICD-10-CM

## 2017-06-02 DIAGNOSIS — Z6823 Body mass index (BMI) 23.0-23.9, adult: Secondary | ICD-10-CM

## 2017-06-02 DIAGNOSIS — Z8261 Family history of arthritis: Secondary | ICD-10-CM

## 2017-06-02 DIAGNOSIS — R402362 Coma scale, best motor response, obeys commands, at arrival to emergency department: Secondary | ICD-10-CM | POA: Diagnosis present

## 2017-06-02 DIAGNOSIS — R471 Dysarthria and anarthria: Secondary | ICD-10-CM | POA: Diagnosis not present

## 2017-06-02 DIAGNOSIS — R41844 Frontal lobe and executive function deficit: Secondary | ICD-10-CM | POA: Diagnosis present

## 2017-06-02 DIAGNOSIS — I6789 Other cerebrovascular disease: Secondary | ICD-10-CM | POA: Diagnosis not present

## 2017-06-02 DIAGNOSIS — R509 Fever, unspecified: Secondary | ICD-10-CM | POA: Diagnosis not present

## 2017-06-02 DIAGNOSIS — Z66 Do not resuscitate: Secondary | ICD-10-CM | POA: Diagnosis not present

## 2017-06-02 DIAGNOSIS — Z8679 Personal history of other diseases of the circulatory system: Secondary | ICD-10-CM

## 2017-06-02 DIAGNOSIS — Z4659 Encounter for fitting and adjustment of other gastrointestinal appliance and device: Secondary | ICD-10-CM

## 2017-06-02 DIAGNOSIS — I161 Hypertensive emergency: Secondary | ICD-10-CM | POA: Diagnosis present

## 2017-06-02 DIAGNOSIS — J690 Pneumonitis due to inhalation of food and vomit: Secondary | ICD-10-CM | POA: Diagnosis not present

## 2017-06-02 DIAGNOSIS — I610 Nontraumatic intracerebral hemorrhage in hemisphere, subcortical: Secondary | ICD-10-CM | POA: Diagnosis not present

## 2017-06-02 DIAGNOSIS — R0602 Shortness of breath: Secondary | ICD-10-CM

## 2017-06-02 DIAGNOSIS — R451 Restlessness and agitation: Secondary | ICD-10-CM | POA: Diagnosis not present

## 2017-06-02 DIAGNOSIS — I619 Nontraumatic intracerebral hemorrhage, unspecified: Secondary | ICD-10-CM | POA: Diagnosis present

## 2017-06-02 DIAGNOSIS — I612 Nontraumatic intracerebral hemorrhage in hemisphere, unspecified: Secondary | ICD-10-CM | POA: Diagnosis not present

## 2017-06-02 DIAGNOSIS — I119 Hypertensive heart disease without heart failure: Secondary | ICD-10-CM | POA: Diagnosis not present

## 2017-06-02 DIAGNOSIS — I629 Nontraumatic intracranial hemorrhage, unspecified: Secondary | ICD-10-CM | POA: Diagnosis not present

## 2017-06-02 DIAGNOSIS — Z87442 Personal history of urinary calculi: Secondary | ICD-10-CM

## 2017-06-02 DIAGNOSIS — R402142 Coma scale, eyes open, spontaneous, at arrival to emergency department: Secondary | ICD-10-CM | POA: Diagnosis present

## 2017-06-02 DIAGNOSIS — R410 Disorientation, unspecified: Secondary | ICD-10-CM

## 2017-06-02 DIAGNOSIS — T380X5A Adverse effect of glucocorticoids and synthetic analogues, initial encounter: Secondary | ICD-10-CM | POA: Diagnosis not present

## 2017-06-02 DIAGNOSIS — I674 Hypertensive encephalopathy: Secondary | ICD-10-CM | POA: Diagnosis not present

## 2017-06-02 DIAGNOSIS — M8580 Other specified disorders of bone density and structure, unspecified site: Secondary | ICD-10-CM | POA: Diagnosis present

## 2017-06-02 DIAGNOSIS — J9811 Atelectasis: Secondary | ICD-10-CM | POA: Diagnosis not present

## 2017-06-02 DIAGNOSIS — J9601 Acute respiratory failure with hypoxia: Secondary | ICD-10-CM | POA: Diagnosis not present

## 2017-06-02 DIAGNOSIS — R0682 Tachypnea, not elsewhere classified: Secondary | ICD-10-CM | POA: Diagnosis not present

## 2017-06-02 DIAGNOSIS — Z8249 Family history of ischemic heart disease and other diseases of the circulatory system: Secondary | ICD-10-CM

## 2017-06-02 DIAGNOSIS — I447 Left bundle-branch block, unspecified: Secondary | ICD-10-CM | POA: Diagnosis not present

## 2017-06-02 DIAGNOSIS — R739 Hyperglycemia, unspecified: Secondary | ICD-10-CM | POA: Diagnosis not present

## 2017-06-02 DIAGNOSIS — R29810 Facial weakness: Secondary | ICD-10-CM | POA: Diagnosis present

## 2017-06-02 DIAGNOSIS — Y92230 Patient room in hospital as the place of occurrence of the external cause: Secondary | ICD-10-CM | POA: Diagnosis not present

## 2017-06-02 DIAGNOSIS — G936 Cerebral edema: Secondary | ICD-10-CM | POA: Diagnosis not present

## 2017-06-02 DIAGNOSIS — Z9889 Other specified postprocedural states: Secondary | ICD-10-CM | POA: Diagnosis not present

## 2017-06-02 DIAGNOSIS — Z9989 Dependence on other enabling machines and devices: Secondary | ICD-10-CM

## 2017-06-02 DIAGNOSIS — D696 Thrombocytopenia, unspecified: Secondary | ICD-10-CM | POA: Diagnosis not present

## 2017-06-02 DIAGNOSIS — Z79899 Other long term (current) drug therapy: Secondary | ICD-10-CM

## 2017-06-02 DIAGNOSIS — D72820 Lymphocytosis (symptomatic): Secondary | ICD-10-CM | POA: Diagnosis not present

## 2017-06-02 DIAGNOSIS — R1310 Dysphagia, unspecified: Secondary | ICD-10-CM | POA: Diagnosis not present

## 2017-06-02 DIAGNOSIS — R400 Somnolence: Secondary | ICD-10-CM | POA: Diagnosis not present

## 2017-06-02 DIAGNOSIS — Z4682 Encounter for fitting and adjustment of non-vascular catheter: Secondary | ICD-10-CM | POA: Diagnosis not present

## 2017-06-02 DIAGNOSIS — E781 Pure hyperglyceridemia: Secondary | ICD-10-CM | POA: Diagnosis present

## 2017-06-02 DIAGNOSIS — H510 Palsy (spasm) of conjugate gaze: Secondary | ICD-10-CM | POA: Diagnosis present

## 2017-06-02 DIAGNOSIS — I609 Nontraumatic subarachnoid hemorrhage, unspecified: Secondary | ICD-10-CM | POA: Diagnosis not present

## 2017-06-02 DIAGNOSIS — I700 Atherosclerosis of aorta: Secondary | ICD-10-CM | POA: Diagnosis present

## 2017-06-02 DIAGNOSIS — C9190 Lymphoid leukemia, unspecified not having achieved remission: Secondary | ICD-10-CM | POA: Diagnosis not present

## 2017-06-02 DIAGNOSIS — R402242 Coma scale, best verbal response, confused conversation, at arrival to emergency department: Secondary | ICD-10-CM | POA: Diagnosis present

## 2017-06-02 DIAGNOSIS — R0989 Other specified symptoms and signs involving the circulatory and respiratory systems: Secondary | ICD-10-CM | POA: Diagnosis not present

## 2017-06-02 DIAGNOSIS — R195 Other fecal abnormalities: Secondary | ICD-10-CM

## 2017-06-02 DIAGNOSIS — R197 Diarrhea, unspecified: Secondary | ICD-10-CM | POA: Diagnosis not present

## 2017-06-02 DIAGNOSIS — G4733 Obstructive sleep apnea (adult) (pediatric): Secondary | ICD-10-CM | POA: Diagnosis not present

## 2017-06-02 LAB — CBC
HEMATOCRIT: 42.9 % (ref 39.0–52.0)
HEMOGLOBIN: 14.7 g/dL (ref 13.0–17.0)
MCH: 32.3 pg (ref 26.0–34.0)
MCHC: 34.3 g/dL (ref 30.0–36.0)
MCV: 94.3 fL (ref 78.0–100.0)
Platelets: 172 10*3/uL (ref 150–400)
RBC: 4.55 MIL/uL (ref 4.22–5.81)
RDW: 12.7 % (ref 11.5–15.5)
WBC: 12.9 10*3/uL — AB (ref 4.0–10.5)

## 2017-06-02 LAB — I-STAT CHEM 8, ED
BUN: 21 mg/dL — ABNORMAL HIGH (ref 6–20)
CALCIUM ION: 1.17 mmol/L (ref 1.15–1.40)
CHLORIDE: 105 mmol/L (ref 101–111)
Creatinine, Ser: 1.1 mg/dL (ref 0.61–1.24)
GLUCOSE: 132 mg/dL — AB (ref 65–99)
HCT: 42 % (ref 39.0–52.0)
HEMOGLOBIN: 14.3 g/dL (ref 13.0–17.0)
Potassium: 3.8 mmol/L (ref 3.5–5.1)
SODIUM: 142 mmol/L (ref 135–145)
TCO2: 27 mmol/L (ref 22–32)

## 2017-06-02 LAB — DIFFERENTIAL
BASOS ABS: 0 10*3/uL (ref 0.0–0.1)
Basophils Relative: 0 %
EOS ABS: 0.3 10*3/uL (ref 0.0–0.7)
Eosinophils Relative: 2 %
LYMPHS PCT: 37 %
Lymphs Abs: 4.8 10*3/uL — ABNORMAL HIGH (ref 0.7–4.0)
MONOS PCT: 6 %
Monocytes Absolute: 0.8 10*3/uL (ref 0.1–1.0)
NEUTROS PCT: 55 %
Neutro Abs: 7 10*3/uL (ref 1.7–7.7)
SMEAR REVIEW: ADEQUATE

## 2017-06-02 LAB — I-STAT TROPONIN, ED: TROPONIN I, POC: 0.01 ng/mL (ref 0.00–0.08)

## 2017-06-02 LAB — PROTIME-INR
INR: 1.04
Prothrombin Time: 13.5 seconds (ref 11.4–15.2)

## 2017-06-02 LAB — COMPREHENSIVE METABOLIC PANEL
ALBUMIN: 4.2 g/dL (ref 3.5–5.0)
ALK PHOS: 75 U/L (ref 38–126)
ALT: 26 U/L (ref 17–63)
AST: 25 U/L (ref 15–41)
Anion gap: 10 (ref 5–15)
BUN: 20 mg/dL (ref 6–20)
CALCIUM: 9.5 mg/dL (ref 8.9–10.3)
CO2: 25 mmol/L (ref 22–32)
Chloride: 107 mmol/L (ref 101–111)
Creatinine, Ser: 1.25 mg/dL — ABNORMAL HIGH (ref 0.61–1.24)
GFR calc Af Amer: >60 mL/min (ref >60)
GFR calc non Af Amer: 54 mL/min — ABNORMAL LOW (ref >60)
GLUCOSE: 136 mg/dL — AB (ref 65–99)
Potassium: 4 mmol/L (ref 3.5–5.1)
SODIUM: 142 mmol/L (ref 135–145)
TOTAL PROTEIN: 6.5 g/dL (ref 6.5–8.1)
Total Bilirubin: 1.2 mg/dL (ref 0.3–1.2)

## 2017-06-02 LAB — MRSA PCR SCREENING: MRSA by PCR: NEGATIVE

## 2017-06-02 LAB — CBG MONITORING, ED: GLUCOSE-CAPILLARY: 143 mg/dL — AB (ref 65–99)

## 2017-06-02 LAB — HEMOGLOBIN A1C
Hgb A1c MFr Bld: 5.3 % (ref 4.8–5.6)
MEAN PLASMA GLUCOSE: 105.41 mg/dL

## 2017-06-02 LAB — APTT: aPTT: 27 seconds (ref 24–36)

## 2017-06-02 MED ORDER — CLEVIDIPINE BUTYRATE 0.5 MG/ML IV EMUL
0.0000 mg/h | INTRAVENOUS | Status: DC
  Administered 2017-06-02: 1 mg/h via INTRAVENOUS
  Filled 2017-06-02 (×2): qty 50

## 2017-06-02 MED ORDER — MANNITOL 20 % IV SOLN
50.0000 g | Freq: Once | INTRAVENOUS | Status: AC
  Administered 2017-06-02: 50 g via INTRAVENOUS
  Filled 2017-06-02 (×2): qty 250

## 2017-06-02 MED ORDER — ACETAMINOPHEN 650 MG RE SUPP
650.0000 mg | RECTAL | Status: DC | PRN
Start: 2017-06-02 — End: 2017-06-06

## 2017-06-02 MED ORDER — SODIUM CHLORIDE 3 % IV SOLN
INTRAVENOUS | Status: DC
  Administered 2017-06-03 (×2): 50 mL/h via INTRAVENOUS
  Administered 2017-06-03 (×2): 75 mL/h via INTRAVENOUS
  Administered 2017-06-04 – 2017-06-05 (×2): 50 mL/h via INTRAVENOUS
  Filled 2017-06-02 (×10): qty 500

## 2017-06-02 MED ORDER — ACETAMINOPHEN 325 MG PO TABS
650.0000 mg | ORAL_TABLET | ORAL | Status: DC | PRN
  Administered 2017-06-04 – 2017-06-05 (×2): 650 mg via ORAL
  Filled 2017-06-02 (×2): qty 2

## 2017-06-02 MED ORDER — LABETALOL HCL 5 MG/ML IV SOLN
INTRAVENOUS | Status: AC
  Administered 2017-06-02: 10 mg via INTRAVENOUS
  Filled 2017-06-02: qty 4

## 2017-06-02 MED ORDER — STROKE: EARLY STAGES OF RECOVERY BOOK
Freq: Once | Status: AC
  Administered 2017-06-03: 1
  Filled 2017-06-02: qty 1

## 2017-06-02 MED ORDER — ONDANSETRON HCL 4 MG/2ML IJ SOLN
4.0000 mg | Freq: Once | INTRAMUSCULAR | Status: AC
  Administered 2017-06-02: 4 mg via INTRAVENOUS

## 2017-06-02 MED ORDER — CLEVIDIPINE BUTYRATE 0.5 MG/ML IV EMUL
0.0000 mg/h | INTRAVENOUS | Status: DC
  Administered 2017-06-02: 10 mg/h via INTRAVENOUS
  Administered 2017-06-02: 2 mg/h via INTRAVENOUS
  Administered 2017-06-02 – 2017-06-03 (×2): 16 mg/h via INTRAVENOUS
  Administered 2017-06-03: 19 mg/h via INTRAVENOUS
  Administered 2017-06-03: 14 mg/h via INTRAVENOUS
  Administered 2017-06-03: 21 mg/h via INTRAVENOUS
  Administered 2017-06-03: 8 mg/h via INTRAVENOUS
  Administered 2017-06-03: 19 mg/h via INTRAVENOUS
  Administered 2017-06-03 – 2017-06-04 (×2): 21 mg/h via INTRAVENOUS
  Administered 2017-06-04: 9 mg/h via INTRAVENOUS
  Administered 2017-06-04: 21 mg/h via INTRAVENOUS
  Administered 2017-06-04: 17 mg/h via INTRAVENOUS
  Administered 2017-06-04: 20 mg/h via INTRAVENOUS
  Administered 2017-06-04: 21 mg/h via INTRAVENOUS
  Administered 2017-06-05: 10 mg/h via INTRAVENOUS
  Administered 2017-06-05: 13 mg/h via INTRAVENOUS
  Administered 2017-06-05: 8 mg/h via INTRAVENOUS
  Administered 2017-06-05: 16 mg/h via INTRAVENOUS
  Administered 2017-06-06: 11 mg/h via INTRAVENOUS
  Administered 2017-06-06: 21 mg/h via INTRAVENOUS
  Administered 2017-06-06: 13 mg/h via INTRAVENOUS
  Administered 2017-06-06: 12 mg/h via INTRAVENOUS
  Administered 2017-06-06: 21 mg/h via INTRAVENOUS
  Administered 2017-06-06: 12 mg/h via INTRAVENOUS
  Administered 2017-06-06: 21 mg/h via INTRAVENOUS
  Administered 2017-06-07 (×3): 18 mg/h via INTRAVENOUS
  Administered 2017-06-07: 15 mg/h via INTRAVENOUS
  Administered 2017-06-08: 1 mg/h via INTRAVENOUS
  Administered 2017-06-09: 1.5 mg/h via INTRAVENOUS
  Filled 2017-06-02 (×7): qty 50
  Filled 2017-06-02: qty 850
  Filled 2017-06-02 (×25): qty 50

## 2017-06-02 MED ORDER — IOPAMIDOL (ISOVUE-370) INJECTION 76%
INTRAVENOUS | Status: AC
  Filled 2017-06-02: qty 50

## 2017-06-02 MED ORDER — LABETALOL HCL 5 MG/ML IV SOLN
10.0000 mg | Freq: Once | INTRAVENOUS | Status: AC
  Administered 2017-06-02: 10 mg via INTRAVENOUS

## 2017-06-02 MED ORDER — IOPAMIDOL (ISOVUE-370) INJECTION 76%
50.0000 mL | Freq: Once | INTRAVENOUS | Status: AC | PRN
  Administered 2017-06-02: 50 mL via INTRAVENOUS

## 2017-06-02 MED ORDER — FAMOTIDINE IN NACL 20-0.9 MG/50ML-% IV SOLN
20.0000 mg | Freq: Two times a day (BID) | INTRAVENOUS | Status: DC
  Administered 2017-06-02 – 2017-06-06 (×9): 20 mg via INTRAVENOUS
  Filled 2017-06-02 (×9): qty 50

## 2017-06-02 MED ORDER — ACETAMINOPHEN 160 MG/5ML PO SOLN
650.0000 mg | ORAL | Status: DC | PRN
  Administered 2017-06-05 – 2017-06-06 (×3): 650 mg
  Filled 2017-06-02 (×3): qty 20.3

## 2017-06-02 MED ORDER — LABETALOL HCL 5 MG/ML IV SOLN
20.0000 mg | Freq: Once | INTRAVENOUS | Status: AC
  Administered 2017-06-02: 20 mg via INTRAVENOUS
  Filled 2017-06-02: qty 4

## 2017-06-02 MED ORDER — ONDANSETRON HCL 4 MG/2ML IJ SOLN
INTRAMUSCULAR | Status: AC
  Filled 2017-06-02: qty 2

## 2017-06-02 MED ORDER — LABETALOL HCL 5 MG/ML IV SOLN
10.0000 mg | Freq: Once | INTRAVENOUS | Status: AC
  Administered 2017-06-02: 10 mg via INTRAVENOUS
  Filled 2017-06-02: qty 4

## 2017-06-02 MED ORDER — SENNOSIDES-DOCUSATE SODIUM 8.6-50 MG PO TABS
1.0000 | ORAL_TABLET | Freq: Two times a day (BID) | ORAL | Status: DC
Start: 2017-06-02 — End: 2017-06-07
  Administered 2017-06-05 – 2017-06-07 (×5): 1 via ORAL
  Filled 2017-06-02 (×7): qty 1

## 2017-06-02 NOTE — Assessment & Plan Note (Signed)
77 y.o. without any B symptoms with mild lymphocytic leukocytosis, anemia, or thrombocytopenia.  Examination is also negative for palpable splenomegaly or lymphadenopathy.  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  Will conduct appropriate evaluation and update the diagnosis as information becomes available.  Plan: -Labs as outlined below. -Return to clinic in 1 week to review the findings.
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
## 2017-06-02 NOTE — ED Notes (Signed)
Pt placed on 4 L [place]  

## 2017-06-02 NOTE — ED Notes (Signed)
Arora, MD at bedside.  

## 2017-06-02 NOTE — ED Notes (Signed)
Pharmacy messaged about unverified meds 

## 2017-06-02 NOTE — Progress Notes (Signed)
[facility] New Visit:  Assessment: Lymphocytosis 77 y.o. without any B symptoms with mild lymphocytic leukocytosis, anemia, or thrombocytopenia.  Examination is also negative for palpable splenomegaly or lymphadenopathy.  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  Will conduct appropriate evaluation and update the diagnosis as information becomes available.  Plan: -Labs as outlined below. -Return to clinic in 1 week to review the findings.   Voice recognition software was used and creation of this note. Despite my best effort at editing the text, some misspelling/errors may have occurred. Orders Placed This Encounter  Procedures  . CBC with Differential (Cancer Center Only)    Standing Status:   Future    Number of Occurrences:   1    Standing Expiration Date:   05/15/2018  . CMP ([facility] only)    Standing Status:   Future    Number of Occurrences:   1    Standing Expiration Date:   05/15/2018  . Lactate dehydrogenase (LDH)    Standing Status:   Future    Number of Occurrences:   1    Standing Expiration Date:   05/14/2018  . Beta 2 microglobulin, serum  . QIG  (Quant. immunoglobulins  - IgG, IgA, IgM)    Standing Status:   Future    Number of Occurrences:   1    Standing Expiration Date:   05/14/2018  . Flow Cytometry    Evaluate for possible CLL    Standing Status:   Future    Number of Occurrences:   1    Standing Expiration Date:   05/14/2018  . Uric acid    Standing Status:   Future    Number of Occurrences:   1    Standing Expiration Date:   05/14/2018    All questions were answered.  . The patient knows to call the clinic with any problems, questions or concerns.  This note was electronically signed.    History of Presenting Illness Jason Callahan 77 y.o. presenting to the [name] for lymphocytosis evaluation, referred by Dr Jason Callahan.  Patient's past medical history is significant for hypertension, gout, history of nephrolithiasis, peripheral  vascular disease, diverticulitis history, CKD, GERD.  Patient is reported to have lymphocytosis since 2017, but only to most recent lab work values have been submitted with the patient.  Patient denies any family history of hematological malignancy or solid tumors.  Vision is a non-smoker and does not drink alcohol to excess.  At the present time, patient reports feeling well with no active symptoms.  He has been losing weight intentionally, denies any changes in appetite.  No nausea, vomiting, abdominal pain, early satiety, diarrhea, or constipation.  No swelling in the neck, armpits, or groin.  No respiratory, genitourinary, or neurological complaints.  Oncological/hematological History: --Labs, 03/26/17: WBC 12.9, ANC 5.0, ALC 6.7, Mono 0.7, Hgb 15.6, Plt 176  --Labs, 04/22/17: WBC 11.7, ANC 5.0, ALC 5.9, Mono 0.6, Hgb 14.4, Plt 189;   Medical History: Past Medical History:  Diagnosis Date  . 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 Diverticulitis   . GERD (gastroesophageal reflux disease)   . Gout   . Hernia, inguinal, left   . Hypertension   . LVH (left ventricular hypertrophy)   . Nephrolithiasis   . Osteopenia   . PVD (peripheral vascular disease) ([place])   . Snoring 02/10/2014    Surgical History: Past Surgical History:  Procedure Laterality Date  . CEA  2000  . HERNIA  REPAIR Left     Family History: Family History  Problem Relation Age of Onset  . Heart disease Father   . Arthritis Mother     Social History: Social History   Socioeconomic History  . Marital status: Married    Spouse name: Jason Callahan  . Number of children: 2  . Years of education: college gr  . Highest education level: Not on file  Occupational History  . Occupation: retired  Scientific laboratory technician  . Financial resource strain: Not on file  . Food insecurity:    Worry: Not on file    Inability: Not on file  . Transportation needs:    Medical: Not on file    Non-medical: Not on file  Tobacco Use  .  Smoking status: Never Smoker  . Smokeless tobacco: Never Used  Substance and Sexual Activity  . Alcohol use: Yes    Alcohol/week: 4.2 oz    Types: 7 Standard drinks or equivalent per week    Comment: Occassional  . Drug use: No  . Sexual activity: Not on file  Lifestyle  . Physical activity:    Days per week: Not on file    Minutes per session: Not on file  . Stress: Not on file  Relationships  . Social connections:    Talks on phone: Not on file    Gets together: Not on file    Attends religious service: Not on file    Active member of club or organization: Not on file    Attends meetings of clubs or organizations: Not on file    Relationship status: Not on file  . Intimate partner violence:    Fear of current or ex partner: Not on file    Emotionally abused: Not on file    Physically abused: Not on file    Forced sexual activity: Not on file  Other Topics Concern  . Not on file  Social History Narrative   Denies caffeine use.    Allergies: No Known Allergies  Medications:  No current facility-administered medications for this visit.    No current outpatient medications on file.   Facility-Administered Medications Ordered in Other Visits  Medication Dose Route Frequency Provider Last Rate Last Dose  .  stroke: mapping our early stages of recovery book   Does not apply Once Jason Portland, MD      . acetaminophen (TYLENOL) tablet 650 mg  650 mg Oral Q4H PRN Jason Portland, MD       Or  . acetaminophen (TYLENOL) solution 650 mg  650 mg Per Tube Q4H PRN Jason Portland, MD       Or  . acetaminophen (TYLENOL) suppository 650 mg  650 mg Rectal Q4H PRN Jason Portland, MD      . clevidipine (CLEVIPREX) infusion 0.5 mg/mL  0-21 mg/hr Intravenous Continuous Jason Portland, MD 32 mL/hr at 05/27/2017 1854 16 mg/hr at 06/12/2017 1854  . famotidine (PEPCID) IVPB 20 mg premix  20 mg Intravenous Q12H Jason Portland, MD      . iopamidol (ISOVUE-370) 76 % injection           . senna-docusate  (Senokot-S) tablet 1 tablet  1 tablet Oral BID Jason Portland, MD        Review of Systems: Review of Systems  All other systems reviewed and are negative.    PHYSICAL EXAMINATION Blood pressure (!) 152/80, pulse 79, temperature 98.6 F (37 C), temperature source Oral, resp. rate 18, height 5' 9.5" (1.765 m), weight 187  lb 6.4 oz (85 kg), SpO2 99 %.  ECOG PERFORMANCE STATUS: 0 - Asymptomatic  Physical Exam  Constitutional: He is oriented to person, place, and time. He appears well-developed and well-nourished. No distress.  HENT:  Head: Normocephalic and atraumatic.  Mouth/Throat: Oropharynx is clear and moist. No oropharyngeal exudate.  Eyes: Pupils are equal, round, and reactive to light. Conjunctivae and EOM are normal. No scleral icterus.  Neck: No thyromegaly present.  Cardiovascular: Normal rate, regular rhythm and intact distal pulses. Exam reveals no gallop and no friction rub.  No murmur heard. Pulmonary/Chest: Effort normal and breath sounds normal. No stridor. No respiratory distress. He has no wheezes. He has no rales.  Abdominal: Soft. Bowel sounds are normal. He exhibits no distension and no mass. There is no tenderness. There is no guarding.  Musculoskeletal: Normal range of motion. He exhibits no edema.  Lymphadenopathy:    He has no cervical adenopathy.  Neurological: He is alert and oriented to person, place, and time. He displays normal reflexes. No cranial nerve deficit or sensory deficit.  Skin: Skin is warm and dry. No rash noted. He is not diaphoretic. No erythema. No pallor.     LABORATORY DATA: I have personally reviewed the data as listed: Clinical Support on 05/14/2017  Component Date Value Ref Range Status  . Uric Acid, Serum 05/14/2017 4.5  2.6 - 7.4 mg/dL Final   Performed at [facility], [place] [address], [place], [place] [address]  . Flow Cytometry 05/14/2017 See Scanned report in [facility]   Final   Performed at  [facility], [address] [place], [place] [address]  . IgG (Immunoglobin G), Serum 05/14/2017 821  700 - 1,600 mg/dL Final  . IgA 05/14/2017 133  61 - 437 mg/dL Final  . IgM (Immunoglobulin M), Srm 05/14/2017 30  15 - 143 mg/dL Final   Comment: (NOTE) Performed At: [facility] [place], [place] [id number] Rush Farmer MD [id number] Performed at [facility], [place] [address], [place], [place] [address]   . LDH 05/14/2017 146  125 - 245 U/L Final   Performed at [facility], [place] [address], [place], [place] [address]  . Sodium 05/14/2017 140  136 - 145 mmol/L Final  . Potassium 05/14/2017 4.1  3.5 - 5.1 mmol/L Final  . Chloride 05/14/2017 105  98 - 109 mmol/L Final  . CO2 05/14/2017 28  22 - 29 mmol/L Final  . Glucose, Bld 05/14/2017 101  70 - 140 mg/dL Final  . BUN 05/14/2017 21  7 - 26 mg/dL Final  . Creatinine 05/14/2017 1.06  0.70 - 1.30 mg/dL Final  . Calcium 05/14/2017 9.8  8.4 - 10.4 mg/dL Final  . Total Protein 05/14/2017 6.9  6.4 - 8.3 g/dL Final  . Albumin 05/14/2017 4.4  3.5 - 5.0 g/dL Final  . AST 05/14/2017 23  5 - 34 U/L Final  . ALT 05/14/2017 24  0 - 55 U/L Final  . Alkaline Phosphatase 05/14/2017 88  40 - 150 U/L Final  . Total Bilirubin 05/14/2017 1.1  0.2 - 1.2 mg/dL Final  . GFR, Est Non Af Am 05/14/2017 >60  >60 mL/min Final  . GFR, Est AFR Am 05/14/2017 >60  >60 mL/min Final   Comment: (NOTE) The eGFR has been calculated using the CKD EPI equation. This calculation has not been validated in all clinical situations. eGFR's persistently <60 mL/min signify possible Chronic  Kidney Disease.   Georgiann Hahn gap 05/14/2017 7  3 - 11 Final   Performed at [facility], [place] [address], [place], [place] [address]  . WBC Count 05/14/2017 10.4* 4.0 - 10.3 K/uL Final  . RBC 05/14/2017 4.42  4.20 - 5.82 MIL/uL Final  . Hemoglobin 05/14/2017 14.7   13.0 - 17.1 g/dL Final  . HCT 05/14/2017 43.8  38.4 - 49.9 % Final  . MCV 05/14/2017 99.1* 79.3 - 98.0 fL Final  . MCH 05/14/2017 33.3  27.2 - 33.4 pg Final  . MCHC 05/14/2017 33.6  32.0 - 36.0 g/dL Final  . RDW 05/14/2017 13.4  11.0 - 14.6 % Final  . Platelet Count 05/14/2017 163  140 - 400 K/uL Final  . Smear Review 05/14/2017 few variant lymphocytes seen   Final  . Neutrophils Relative % 05/14/2017 46  % Final  . Neutro Abs 05/14/2017 4.7  1.5 - 6.5 K/uL Final  . Lymphocytes Relative 05/14/2017 47  % Final  . Lymphs Abs 05/14/2017 4.9* 0.9 - 3.3 K/uL Final  . Monocytes Relative 05/14/2017 5  % Final  . Monocytes Absolute 05/14/2017 0.6  0.1 - 0.9 K/uL Final  . Eosinophils Relative 05/14/2017 2  % Final  . Eosinophils Absolute 05/14/2017 0.2  0.0 - 0.5 K/uL Final  . Basophils Relative 05/14/2017 0  % Final  . Basophils Absolute 05/14/2017 0.0  0.0 - 0.1 K/uL Final   Performed at [facility], [place] [address], [place], [place] [address]  Office Visit on 05/14/2017  Component Date Value Ref Range Status  . Beta-2 Microglobulin 05/14/2017 1.8  0.6 - 2.4 mg/L Final   Comment: (NOTE) Siemens Immulite 2000 Immunochemiluminometric assay (ICMA) Values obtained with different assay methods or kits cannot be used interchangeably. Results cannot be interpreted as absolute evidence of the presence or absence of malignant disease. Performed At: [facility] [place], [place] [id number] Rush Farmer MD [id number] Performed at [facility], [place] [address], [place], [place] [address]          Ardath Sax, MD

## 2017-06-02 NOTE — Progress Notes (Signed)
RT placed patient on CPAP with 2L O2 bled into circuit. Patient tolerating well at this time. RT will monitor as needed. 

## 2017-06-02 NOTE — ED Provider Notes (Signed)
[facility] EMERGENCY DEPARTMENT Provider Note   CSN: [id number] Arrival date & time: 05/22/2017  1749     History   Chief Complaint Chief Complaint  Patient presents with  . Code Stroke    HPI Jason Callahan is a 77 y.o. male.  Patient presents via EMS code stroke for sudden onset of weakness right side, aphasia and vomiting around 4:30 today. No history of similar. No head injury. No blood thinners. No improvement since it started. Difficulty obtaining history due to presentation. Family on route. Patient does have a history in his chart of carotid artery occlusion.     Past Medical History:  Diagnosis Date  . 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 Diverticulitis   . GERD (gastroesophageal reflux disease)   . Gout   . Hernia, inguinal, left   . Hypertension   . LVH (left ventricular hypertrophy)   . Nephrolithiasis   . Osteopenia   . PVD (peripheral vascular disease) ([place])   . Snoring 02/10/2014    Patient Active Problem List   Diagnosis Date Noted  . OSA on CPAP 06/15/2014  . Retrognathia 06/15/2014  . Nocturia more than twice per night 06/15/2014  . Sleep apnea 03/09/2014  . 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 Snoring 02/10/2014    Past Surgical History:  Procedure Laterality Date  . CEA  2000  . HERNIA REPAIR Left         Home Medications    Prior to Admission medications   Medication Sig Start Date End Date Taking? Authorizing Provider  allopurinol (ZYLOPRIM) 300 MG tablet Take 300 mg by mouth daily.  12/23/13   [provider]  cholecalciferol (VITAMIN D) 1000 UNITS tablet Take 1,000 Units by mouth daily.    [provider]  cyanocobalamin 1000 MCG tablet Take 1,000 mcg by mouth daily.    [provider]  irbesartan-hydrochlorothiazide (AVALIDE) 300-12.5 MG per tablet Take 0.5 tablets by mouth daily.  12/31/13   [provider]  Multiple Vitamin (MULTIVITAMIN) tablet  Take 1 tablet by mouth daily. Centrum Silver    [provider]  rosuvastatin (CRESTOR) 20 MG tablet Take 20 mg by mouth daily.    [provider]    Family History Family History  Problem Relation Age of Onset  . Heart disease Father   . Arthritis Mother     Social History Social History   Tobacco Use  . Smoking status: Never Smoker  . Smokeless tobacco: Never Used  Substance Use Topics  . Alcohol use: Yes    Alcohol/week: 4.2 oz    Types: 7 Standard drinks or equivalent per week    Comment: Occassional  . Drug use: No     Allergies   Patient has no known allergies.   Review of Systems Review of Systems  Unable to perform ROS: Acuity of condition     Physical Exam Updated Vital Signs BP (!) 167/93   Pulse 87   Ht 5\' 10"  (1.778 m)   Wt 84.5 kg (186 lb 4.6 oz)   SpO2 94%   BMI 26.73 kg/m   Physical Exam  Constitutional: He appears well-developed and well-nourished.  HENT:  Head: Normocephalic and atraumatic.  Eyes: Conjunctivae are normal. Right eye exhibits no discharge. Left eye exhibits no discharge.  Neck: Normal range of motion. Neck supple. No tracheal deviation present.  Cardiovascular: Normal rate.  Pulmonary/Chest: Effort normal.  Abdominal: Soft. There is no tenderness.  Musculoskeletal: He  exhibits no edema.  Neurological: He is alert. A cranial nerve deficit is present. GCS eye subscore is 4. GCS verbal subscore is 4. GCS motor subscore is 6.  Right leg drift and weakness.   Mild difficulty following commands. Expressive aphasia. Normal left sided strength PERRL. Mild sedate, arousable to loud verbal.   Skin: Skin is warm. No rash noted.  Psychiatric:  Altered, difficulty following commands  Nursing note and vitals reviewed.    ED Treatments / Results  Labs (all labs ordered are listed, but only abnormal results are displayed) Labs Reviewed  CBC - Abnormal; Notable for the following components:      Result Value     WBC 12.9 (*)    All other components within normal limits  CBG MONITORING, ED - Abnormal; Notable for the following components:   Glucose-Capillary 143 (*)    All other components within normal limits  I-STAT CHEM 8, ED - Abnormal; Notable for the following components:   BUN 21 (*)    Glucose, Bld 132 (*)    All other components within normal limits  PROTIME-INR  APTT  DIFFERENTIAL  COMPREHENSIVE METABOLIC PANEL  I-STAT TROPONIN, ED    EKG None  Radiology Ct Head Code Stroke Wo Contrast  Result Date: 05/23/2017 CLINICAL DATA:  Code stroke. Altered level of consciousness. Aphasia EXAM: CT HEAD WITHOUT CONTRAST TECHNIQUE: Contiguous axial images were obtained from the base of the skull through the vertex without intravenous contrast. COMPARISON:  None. FINDINGS: Brain: Large area of high-density hemorrhage in the left frontal lobe with mixed densities. This measures 6.4 x 3.9 x 5.0 cm corresponding to 61 mL of blood. Surrounding edema. There is mass-effect and 4 mm midline shift to the right. No subarachnoid or subdural hemorrhage Mild atrophy.  No other areas of hemorrhage or acute infarct Vascular: Normal arterial density. Skull: Negative Sinuses/Orbits: Paranasal sinuses show mild mucosal edema. Right cataract surgery Other: None ASPECTS ([place] Stroke Program Early CT Score) - Ganglionic level infarction (caudate, lentiform nuclei, internal capsule, insula, M1-M3 cortex): 7 - Supraganglionic infarction (M4-M6 cortex): 3 Total score (0-10 with 10 being normal): 10 IMPRESSION: 1. Large hemorrhage in the left frontal lobe, 61 mL. Differential diagnostic considerations include amyloid, hypertension, vascular malformation. Hemorrhagic stroke not considered likely with acute onset of symptoms. 2. ASPECTS is 10 3. These results were called by telephone at the time of interpretation on 05/19/2017 at 6:16 pm to Dr. Rory Percy , who verbally acknowledged these results. Electronically Signed   By: Franchot Gallo M.D.   On: 05/24/2017 18:17    Procedures .Critical Care Performed by: Elnora Morrison, MD Authorized by: Elnora Morrison, MD   Critical care provider statement:    Critical care time (minutes):  35   Critical care start time:  05/25/2017 6:10 PM   Critical care end time:  06/13/2017 6:45 PM   Critical care time was exclusive of:  Separately billable procedures and treating other patients and teaching time   Critical care was necessary to treat or prevent imminent or life-threatening deterioration of the following conditions:  CNS failure or compromise   Critical care was time spent personally by me on the following activities:  Examination of patient, discussions with consultants, ordering and review of radiographic studies, ordering and review of laboratory studies, ordering and performing treatments and interventions and re-evaluation of patient's condition   I assumed direction of critical care for this patient from another provider in my specialty: no     (including critical  care time)  Medications Ordered in ED Medications  clevidipine (CLEVIPREX) infusion 0.5 mg/mL (1 mg/hr Intravenous New Bag/Given 05/15/2017 1834)  iopamidol (ISOVUE-370) 76 % injection (has no administration in time range)  labetalol (NORMODYNE,TRANDATE) injection 10 mg (has no administration in time range)  ondansetron (ZOFRAN) injection 4 mg (4 mg Intravenous Given 05/27/2017 1810)  labetalol (NORMODYNE,TRANDATE) injection 10 mg (10 mg Intravenous Given 06/08/2017 1818)  labetalol (NORMODYNE,TRANDATE) injection 10 mg (10 mg Intravenous Given 05/31/2017 1810)  iopamidol (ISOVUE-370) 76 % injection 50 mL (50 mLs Intravenous Contrast Given 05/19/2017 1829)     Initial Impression / Assessment and Plan / ED Course  I have reviewed the triage vital signs and the nursing notes.  Pertinent labs & imaging results that were available during my care of the patient were reviewed by me and considered in my medical decision making  (see chart for details).    Patient presents as code stroke. Assessed on arrival by neurology specialist to myself. Patient sent immediately to CT scan unfortunately showed significant hemorrhage frontal lobe. Patient currently protecting airway discussed in detail at bedside with patient's wife admission to the ICU and potential for needing intubation. Patient is a full code at this time. Neurology ordered blood pressure medications. Patient is not on any anticoagulants.  The patients results and plan were reviewed and discussed.   Any x-rays performed were independently reviewed by myself.   Differential diagnosis were considered with the presenting HPI.  Medications  clevidipine (CLEVIPREX) infusion 0.5 mg/mL (1 mg/hr Intravenous New Bag/Given 05/25/2017 1834)  iopamidol (ISOVUE-370) 76 % injection (has no administration in time range)  labetalol (NORMODYNE,TRANDATE) injection 10 mg (has no administration in time range)  ondansetron (ZOFRAN) injection 4 mg (4 mg Intravenous Given 06/13/2017 1810)  labetalol (NORMODYNE,TRANDATE) injection 10 mg (10 mg Intravenous Given 06/05/2017 1818)  labetalol (NORMODYNE,TRANDATE) injection 10 mg (10 mg Intravenous Given 05/28/2017 1810)  iopamidol (ISOVUE-370) 76 % injection 50 mL (50 mLs Intravenous Contrast Given 05/24/2017 1829)    Vitals:   05/25/2017 1827 05/24/2017 1828 05/16/2017 1833 05/26/2017 1835  BP: (!) 174/91  (!) 167/93   Pulse: 87     SpO2: 94%     Weight:  84.5 kg (186 lb 4.6 oz)    Height:    5\' 10"  (1.778 m)    Final diagnoses:  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

## 2017-06-02 NOTE — Consult Note (Signed)
Reason for Consult: Intracerebral hemorrhage Referring Physician: Neurology  Jason Callahan is an 77 y.o. male.   HPI:  77 year old gentleman who was admitted with sudden onset of aphasia and right hemiplegia. CT scan showed a left frontal hemorrhage. Patient cannot cooperate with history and physical other than follow some simple commands.  Past Medical History:  Diagnosis Date  . 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 Diverticulitis   . GERD (gastroesophageal reflux disease)   . Gout   . Hernia, inguinal, left   . Hypertension   . LVH (left ventricular hypertrophy)   . Nephrolithiasis   . Osteopenia   . PVD (peripheral vascular disease) ([place])   . Snoring 02/10/2014    Past Surgical History:  Procedure Laterality Date  . CEA  2000  . HERNIA REPAIR Left     No Known Allergies  Social History   Tobacco Use  . Smoking status: Never Smoker  . Smokeless tobacco: Never Used  Substance Use Topics  . Alcohol use: Yes    Alcohol/week: 4.2 oz    Types: 7 Standard drinks or equivalent per week    Comment: Occassional    Family History  Problem Relation Age of Onset  . Heart disease Father   . Arthritis Mother      Review of Systems  Positive ROS: neg  All other systems have been reviewed and were otherwise negative with the exception of those mentioned in the HPI and as above.  Objective: Vital signs in last 24 hours: Temp:  [98.6 F (37 C)] 98.6 F (37 C) (05/19 1950) Pulse Rate:  [82-93] 85 (05/19 2030) Resp:  [13-24] 19 (05/19 2030) BP: (120-176)/(61-93) 124/62 (05/19 2030) SpO2:  [91 %-100 %] 97 % (05/19 2030) FiO2 (%):  [21 %] 21 % (05/19 1840) Weight:  [82.6 kg (182 lb 1.6 oz)-84.5 kg (186 lb 4.6 oz)] 82.6 kg (182 lb 1.6 oz) (05/19 1950)  General Appearance: Alert, cooperative, no distress, appears stated age Head: Normocephalic, without obvious abnormality, atraumatic Eyes: PERRL, conjunctiva/corneas clear, gaze is conjugate     NEUROLOGIC:    Mental status: Wide-awake and alert in regards the examiner, expressive aphasia, fairly good attention span, Memory and fund of knowledge unable to be tested Motor Exam - grossly normal, normal tone and bulk on the left but has a right hemiplegia face and arm Sensory Exam -not tested Reflexes: Not tested Coordination - not tested Gait - not tested Balance - not tested Cranial Nerves: I: smell Not tested  II: visual acuity  OS: na    OD: na  II: visual fields   II: pupils   III,VII: ptosis   III,IV,VI: extraocular muscles    V: mastication   V: facial light touch sensation    V,VII: corneal reflex    VII: facial muscle function - upper  Right facial weakness   VII: facial muscle function - lower Right facial weakness   VIII: hearing Follows commands   IX: soft palate elevation    IX,X: gag reflex   XI: trapezius strength    XI: sternocleidomastoid strength   XI: neck flexion strength    XII: tongue strength  Tongue midline     Data Review Lab Results  Component Value Date   WBC 12.9 (H) 06/10/2017   HGB 14.3 06/12/2017   HCT 42.0 05/25/2017   MCV 94.3 05/20/2017   PLT 172 05/21/2017   Lab Results  Component Value Date   NA 142 05/23/2017   K 3.8  06/04/2017   CL 105 05/28/2017   CO2 25 06/04/2017   BUN 21 (H) 05/17/2017   CREATININE 1.10 06/03/2017   GLUCOSE 132 (H) 06/01/2017   Lab Results  Component Value Date   INR 1.04 06/11/2017    Radiology: Ct Angio Head W Or Wo Contrast  Result Date: 06/01/2017 CLINICAL DATA:  Intracranial hemorrhage EXAM: CT ANGIOGRAPHY HEAD AND NECK TECHNIQUE: Multidetector CT imaging of the head and neck was performed using the standard protocol during bolus administration of intravenous contrast. Multiplanar CT image reconstructions and MIPs were obtained to evaluate the vascular anatomy. Carotid stenosis measurements (when applicable) are obtained utilizing NASCET criteria, using the distal internal carotid diameter as the  denominator. CONTRAST:  94mL ISOVUE-370 IOPAMIDOL (ISOVUE-370) INJECTION 76% COMPARISON:  CT head immediately preceding FINDINGS: CTA NECK FINDINGS Aortic arch: Mild atherosclerotic disease aortic arch. Proximal great vessels widely patent Right carotid system: Right common carotid artery widely patent. Right carotid artery endarterectomy widely patent Left carotid system: Left common carotid artery widely patent. Mild atherosclerotic disease. 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 Left external carotid artery widely patent. Vertebral arteries: Both vertebral arteries are widely patent without stenosis. Skeleton: No acute abnormality. Cervical spine degenerative changes. Other neck: Negative for mass or adenopathy. Upper chest: Negative Review of the MIP images confirms the above findings CTA HEAD FINDINGS Anterior circulation: Atherosclerotic calcification right cavernous carotid with mild stenosis. Right anterior and middle cerebral arteries patent without significant stenosis. 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 Left anterior and middle cerebral arteries patent without stenosis. Posterior circulation: Both vertebral arteries patent to the basilar. Basilar widely patent. PICA, superior cerebellar, posterior cerebral arteries patent without stenosis. Venous sinuses: Patent Anatomic variants: None Delayed phase: Large hematoma left frontal lobe unchanged from preceding CT. Local mass-effect and 4 mm midline shift to the right is unchanged. No enhancing mass lesion identified. Review of the MIP images confirms the above findings IMPRESSION: 1. Large left frontal hematoma. No evidence of underlying vascular malformation or tumor 2. Right carotid endarterectomy widely patent 3. Left internal carotid artery occluded in the neck with  reconstitution in the cavernous segment. Left cavernous carotid is patent but severely stenotic. Electronically Signed   By: Franchot Gallo M.D.   On: 05/24/2017 18:51   Ct Angio Neck W Or Wo Contrast  Result Date: 05/30/2017 CLINICAL DATA:  Intracranial hemorrhage EXAM: CT ANGIOGRAPHY HEAD AND NECK TECHNIQUE: Multidetector CT imaging of the head and neck was performed using the standard protocol during bolus administration of intravenous contrast. Multiplanar CT image reconstructions and MIPs were obtained to evaluate the vascular anatomy. Carotid stenosis measurements (when applicable) are obtained utilizing NASCET criteria, using the distal internal carotid diameter as the denominator. CONTRAST:  24mL ISOVUE-370 IOPAMIDOL (ISOVUE-370) INJECTION 76% COMPARISON:  CT head immediately preceding FINDINGS: CTA NECK FINDINGS Aortic arch: Mild atherosclerotic disease aortic arch. Proximal great vessels widely patent Right carotid system: Right common carotid artery widely patent. Right carotid artery endarterectomy widely patent Left carotid system: Left common carotid artery widely patent. Mild atherosclerotic disease. 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 Left external carotid artery widely patent. Vertebral arteries: Both vertebral arteries are widely patent without stenosis. Skeleton: No acute abnormality. Cervical spine degenerative changes. Other neck: Negative for mass or adenopathy. Upper chest: Negative Review of the MIP images confirms  the above findings CTA HEAD FINDINGS Anterior circulation: Atherosclerotic calcification right cavernous carotid with mild stenosis. Right anterior and middle cerebral arteries patent without significant stenosis. 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 Left anterior and middle cerebral arteries  patent without stenosis. Posterior circulation: Both vertebral arteries patent to the basilar. Basilar widely patent. PICA, superior cerebellar, posterior cerebral arteries patent without stenosis. Venous sinuses: Patent Anatomic variants: None Delayed phase: Large hematoma left frontal lobe unchanged from preceding CT. Local mass-effect and 4 mm midline shift to the right is unchanged. No enhancing mass lesion identified. Review of the MIP images confirms the above findings IMPRESSION: 1. Large left frontal hematoma. No evidence of underlying vascular malformation or tumor 2. Right carotid endarterectomy widely patent 3. 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 Left cavernous carotid is patent but severely stenotic. Electronically Signed   By: Franchot Gallo M.D.   On: 06/11/2017 18:51   Ct Head Code Stroke Wo Contrast  Result Date: 05/28/2017 CLINICAL DATA:  Code stroke. Altered level of consciousness. Aphasia EXAM: CT HEAD WITHOUT CONTRAST TECHNIQUE: Contiguous axial images were obtained from the base of the skull through the vertex without intravenous contrast. COMPARISON:  None. FINDINGS: Brain: Large area of high-density hemorrhage in the left frontal lobe with mixed densities. This measures 6.4 x 3.9 x 5.0 cm corresponding to 61 mL of blood. Surrounding edema. There is mass-effect and 4 mm midline shift to the right. No subarachnoid or subdural hemorrhage Mild atrophy.  No other areas of hemorrhage or acute infarct Vascular: Normal arterial density. Skull: Negative Sinuses/Orbits: Paranasal sinuses show mild mucosal edema. Right cataract surgery Other: None ASPECTS ([place] Stroke Program Early CT Score) - Ganglionic level infarction (caudate, lentiform nuclei, internal capsule, insula, M1-M3 cortex): 7 - Supraganglionic infarction (M4-M6 cortex): 3 Total score (0-10 with 10 being normal): 10 IMPRESSION: 1. Large hemorrhage in the left frontal lobe,  61 mL. Differential diagnostic considerations include amyloid, hypertension, vascular malformation. Hemorrhagic stroke not considered likely with acute onset of symptoms. 2. ASPECTS is 10 3. These results were called by telephone at the time of interpretation on 05/24/2017 at 6:16 pm to Dr. Rory Percy , who verbally acknowledged these results. Electronically Signed   By: Franchot Gallo M.D.   On: 06/13/2017 18:17     Assessment/Plan: Estimated body mass index is 26.13 kg/m as calculated from the following:   Height as of this encounter: 5\' 10"  (1.778 m).   Weight as of this encounter: 82.6 kg (182 lb 1.6 oz).   77 year old gentleman with a left frontal intracerebral hemorrhage causing expressive aphasia and right hemiplegia. Had a long talk with the patient's wife and daughter. Dr. Juanetta Snow, a retired neurosurgeon and family friend, was also at the bedside. At this point I do not find this to be a surgical lesion. The patient is awake and alert and follows commands, and this operation would simply be a life saving operation if it came to that. It would not change his current neurologic status or improve his recovery or improve his neurologic status. I've explained that to the family in detail. At this time, He does not need a life saving craniotomy. Based on the initial CT scan I do not believe that this hemorrhage will lead to mortality, but certainly will lead to morbidity. I have spoken to the wife at length about decision-making move  forward should the situation become more dire. Quality-of-life must be considered in these situations, and since he will likely remain hemiplegic and aphasic it is not often that we recommend aggressive surgical intervention with dominant hemisphere hemorrhages.   Modine Oppenheimer S 05/29/2017 8:47 PM

## 2017-06-03 ENCOUNTER — Other Ambulatory Visit: Payer: Self-pay

## 2017-06-03 ENCOUNTER — Inpatient Hospital Stay (HOSPITAL_COMMUNITY): Payer: PPO

## 2017-06-03 ENCOUNTER — Encounter (HOSPITAL_COMMUNITY): Payer: Self-pay | Admitting: Radiology

## 2017-06-03 ENCOUNTER — Encounter: Payer: Self-pay | Admitting: Hematology and Oncology

## 2017-06-03 DIAGNOSIS — C9190 Lymphoid leukemia, unspecified not having achieved remission: Secondary | ICD-10-CM

## 2017-06-03 DIAGNOSIS — G936 Cerebral edema: Secondary | ICD-10-CM

## 2017-06-03 DIAGNOSIS — Z9889 Other specified postprocedural states: Secondary | ICD-10-CM

## 2017-06-03 DIAGNOSIS — I6522 Occlusion and stenosis of left carotid artery: Secondary | ICD-10-CM

## 2017-06-03 DIAGNOSIS — I161 Hypertensive emergency: Secondary | ICD-10-CM

## 2017-06-03 LAB — BASIC METABOLIC PANEL
Anion gap: 11 (ref 5–15)
BUN: 19 mg/dL (ref 6–20)
CALCIUM: 9.3 mg/dL (ref 8.9–10.3)
CHLORIDE: 105 mmol/L (ref 101–111)
CO2: 28 mmol/L (ref 22–32)
CREATININE: 1.09 mg/dL (ref 0.61–1.24)
GLUCOSE: 176 mg/dL — AB (ref 65–99)
POTASSIUM: 3.7 mmol/L (ref 3.5–5.1)
SODIUM: 144 mmol/L (ref 135–145)

## 2017-06-03 LAB — SODIUM
SODIUM: 144 mmol/L (ref 135–145)
SODIUM: 147 mmol/L — AB (ref 135–145)
Sodium: 141 mmol/L (ref 135–145)
Sodium: 142 mmol/L (ref 135–145)

## 2017-06-03 LAB — CBC
HCT: 42.1 % (ref 39.0–52.0)
HEMOGLOBIN: 14.4 g/dL (ref 13.0–17.0)
MCH: 33 pg (ref 26.0–34.0)
MCHC: 34.2 g/dL (ref 30.0–36.0)
MCV: 96.6 fL (ref 78.0–100.0)
Platelets: 190 10*3/uL (ref 150–400)
RBC: 4.36 MIL/uL (ref 4.22–5.81)
RDW: 12.8 % (ref 11.5–15.5)
WBC: 18.3 10*3/uL — ABNORMAL HIGH (ref 4.0–10.5)

## 2017-06-03 LAB — LIPID PANEL
Cholesterol: 111 mg/dL (ref 0–200)
HDL: 46 mg/dL (ref >40)
LDL CALC: 42 mg/dL (ref 0–99)
Total CHOL/HDL Ratio: 2.4 RATIO
Triglycerides: 117 mg/dL (ref <150)
VLDL: 23 mg/dL (ref 0–40)

## 2017-06-03 LAB — PATHOLOGIST SMEAR REVIEW

## 2017-06-03 MED ORDER — ORAL CARE MOUTH RINSE
15.0000 mL | Freq: Two times a day (BID) | OROMUCOSAL | Status: DC
  Administered 2017-06-03 – 2017-06-14 (×22): 15 mL via OROMUCOSAL

## 2017-06-03 MED ORDER — SODIUM CHLORIDE 0.9% FLUSH: 10.0000 mL | INTRAVENOUS | Status: DC | PRN

## 2017-06-03 MED ORDER — SODIUM CHLORIDE 23.4 % INJECTION (4 MEQ/ML) FOR IV ADMINISTRATION
120.0000 meq | Freq: Once | INTRAVENOUS | Status: AC
  Administered 2017-06-03: 120 meq via INTRAVENOUS
  Filled 2017-06-03: qty 30

## 2017-06-03 MED ORDER — LOSARTAN POTASSIUM 50 MG PO TABS
100.0000 mg | ORAL_TABLET | Freq: Every day | ORAL | Status: DC
  Administered 2017-06-04 – 2017-06-13 (×9): 100 mg via ORAL
  Filled 2017-06-03 (×10): qty 2

## 2017-06-03 MED ORDER — GADOBENATE DIMEGLUMINE 529 MG/ML IV SOLN
15.0000 mL | Freq: Once | INTRAVENOUS | Status: AC | PRN
  Administered 2017-06-03: 15 mL via INTRAVENOUS

## 2017-06-03 MED ORDER — ONDANSETRON HCL 4 MG/2ML IJ SOLN
INTRAMUSCULAR | Status: AC
  Filled 2017-06-03: qty 2

## 2017-06-03 MED ORDER — SODIUM CHLORIDE 0.9% FLUSH
10.0000 mL | Freq: Two times a day (BID) | INTRAVENOUS | Status: DC
  Administered 2017-06-03 – 2017-06-10 (×13): 10 mL

## 2017-06-03 MED ORDER — ONDANSETRON HCL 4 MG/2ML IJ SOLN
4.0000 mg | Freq: Four times a day (QID) | INTRAMUSCULAR | Status: DC | PRN
  Administered 2017-06-03 – 2017-06-07 (×4): 4 mg via INTRAVENOUS
  Filled 2017-06-03 (×3): qty 2

## 2017-06-03 MED ORDER — LABETALOL HCL 5 MG/ML IV SOLN
10.0000 mg | INTRAVENOUS | Status: DC | PRN
  Administered 2017-06-03 – 2017-06-04 (×5): 20 mg via INTRAVENOUS
  Filled 2017-06-03 (×4): qty 4

## 2017-06-03 MED ORDER — CHLORHEXIDINE GLUCONATE CLOTH 2 % EX PADS
6.0000 | MEDICATED_PAD | Freq: Every day | CUTANEOUS | Status: DC
  Administered 2017-06-04 – 2017-06-10 (×7): 6 via TOPICAL

## 2017-06-03 NOTE — Procedures (Signed)
Central line insertion  Indication: Administration of hypertonic saline  Procedure: After obtaining informed consent and performing a timeout the area over the right subclavian vein was extensively prepped with chlorhexidine.  A large drape was applied and sterile garb donned.  The right subclavian vein was easily cannulated and a wire gently passed.  The skin was sharply incised and a dilator passed.  A 20 cm 7 French triple-lumen catheter was placed over the wire without difficulty.  There was good flow from all ports.  The catheter was sutured in place at 15 cm.  Biopatch and sterile dressing were applied.    A chest x-ray is pending.

## 2017-06-03 NOTE — Progress Notes (Signed)
Patient ID: Jason Callahan, male   DOB: 1940/05/24, 77 y.o.   MRN: [id number] Pt seen and examined and spoke with wife/ son, awake alert, FC less this am, seems more globally aphasic, R hemiplegic, moves L side well, CT head reviewed. Still do not recommend surgery as he 's awake alert. Please cal if we can be of any assistance.

## 2017-06-03 NOTE — Evaluation (Signed)
Clinical/Bedside Swallow Evaluation Patient Details  Name: Jason Callahan MRN: [id number] Date of Birth: 1940/09/20  Today's Date: 06/03/2017 Time: SLP Start Time (ACUTE ONLY): 2951 SLP Stop Time (ACUTE ONLY): 1000 SLP Time Calculation (min) (ACUTE ONLY): 35 min  Past Medical History:  Past Medical History:  Diagnosis Date  . 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 Diverticulitis   . GERD (gastroesophageal reflux disease)   . Gout   . Hernia, inguinal, left   . Hypertension   . LVH (left ventricular hypertrophy)   . Nephrolithiasis   . Osteopenia   . PVD (peripheral vascular disease) ([place])   . Snoring 02/10/2014   Past Surgical History:  Past Surgical History:  Procedure Laterality Date  . CEA  2000  . HERNIA REPAIR Left    HPI:  77 year old gentleman who was admitted with sudden onset of aphasia and right hemiplegia. CT scan showed a left frontal hemorrhage.    Assessment / Plan / Recommendation Clinical Impression  Pt demonstrates a mild oral dysphagia secondary to right facial likely motor and sensory deficits. There are minimal episodes of anoterior spillage with initial trials of textures, improved as assessment progressed. Initial sip of water over-large with anterior spill and immediate cough response. Further trials progressing from ice chips to cup sips with max tactile cues and finally to independent consecutive straw sips all tolerated without further signs of aspiration. Discussed aspriation precautions with family. Pt may intiate a dys 3 (mech soft) diet with thin liquids with full supervision. Will f/u for tolerance and modification as needed.  SLP Visit Diagnosis: Dysphagia, oral phase (R13.11)    Aspiration Risk       Diet Recommendation Dysphagia 3 (Mech soft);Thin liquid   Liquid Administration via: Cup;Straw Medication Administration: Whole meds with liquid Supervision: Staff to assist with self feeding;Full supervision/cueing for compensatory  strategies Compensations: Slow rate    Other  Recommendations     Follow up Recommendations Inpatient Rehab      Frequency and Duration min 2x/week  2 weeks       Prognosis Prognosis for Safe Diet Advancement: Good      Swallow Study   General HPI: 77 year old gentleman who was admitted with sudden onset of aphasia and right hemiplegia. CT scan showed a left frontal hemorrhage.  Type of Study: Bedside Swallow Evaluation Diet Prior to this Study: NPO Temperature Spikes Noted: No Respiratory Status: Room air History of Recent Intubation: No Behavior/Cognition: Cooperative;Pleasant mood;Requires cueing Oral Cavity Assessment: Within Functional Limits Oral Care Completed by SLP: No Oral Cavity - Dentition: Adequate natural dentition Self-Feeding Abilities: Able to feed self;Needs assist Patient Positioning: Upright in bed Baseline Vocal Quality: Not observed Volitional Cough: Cognitively unable to elicit Volitional Swallow: Unable to elicit    Oral/Motor/Sensory Function Overall Oral Motor/Sensory Function: Mild impairment Facial ROM: Within Functional Limits Facial Symmetry: Abnormal symmetry right Facial Strength: Suspected CN VII (facial) dysfunction Facial Sensation: Reduced right Lingual ROM: Within Functional Limits   Ice Chips Ice chips: Within functional limits Presentation: Spoon   Thin Liquid Thin Liquid: Impaired Presentation: Cup;Straw Oral Phase Impairments: Reduced labial seal Oral Phase Functional Implications: Right anterior spillage Pharyngeal  Phase Impairments: Cough - Immediate    Nectar Thick Nectar Thick Liquid: Not tested   Honey Thick Honey Thick Liquid: Not tested   Puree Puree: Impaired Presentation: Spoon;Self Fed Oral Phase Functional Implications: Right anterior spillage   Solid   GO   Solid: Impaired Oral Phase Functional Implications: Right anterior spillage  Herbie [place], MA CCC-SLP [phone]  Laury Huizar, Katherene Ponto 06/03/2017,10:29 AM

## 2017-06-03 NOTE — Progress Notes (Signed)
PT Cancellation Note  Patient Details Name: Jason Callahan MRN: [id number] DOB: 10-11-1940   Cancelled Treatment:    Reason Eval/Treat Not Completed: Active bedrest order; will attempt to see another day.   Reginia Naas 06/03/2017, 4:10 PM  Magda Kiel, [place] 06/03/2017

## 2017-06-03 NOTE — Progress Notes (Signed)
OT Cancellation Note  Patient Details Name: Jason Callahan MRN: [id number] DOB: 1940-12-14   Cancelled Treatment:    Reason Eval/Treat Not Completed: Active bedrest order  [place], OT/L  OT Clinical Specialist [phone]  06/03/2017, 6:53 AM

## 2017-06-03 NOTE — Evaluation (Signed)
Speech Language Pathology Evaluation Patient Details Name: Jason Callahan MRN: [id number] DOB: 1940/07/04 Today's Date: 06/03/2017 Time: 0925-1000 SLP Time Calculation (min) (ACUTE ONLY): 35 min  Problem List:  Patient Active Problem List   Diagnosis Date Noted  . Cerebral edema ([place]) 06/03/2017  . Hypertensive emergency 06/03/2017  . ICH (intracerebral hemorrhage) ([place]) 06/06/2017  . Lymphocytosis 06/12/2017  . OSA on CPAP 06/15/2014  . Retrognathia 06/15/2014  . Nocturia more than twice per night 06/15/2014  . Sleep apnea 03/09/2014  . Carotid artery occlusion with infarction ([place]) 03/09/2014  . Snoring 02/10/2014  . Dislocated intraocular lens 05/07/2012  . Retinal detachment 05/07/2012   Past Medical History:  Past Medical History:  Diagnosis Date  . 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 Diverticulitis   . GERD (gastroesophageal reflux disease)   . Gout   . Hernia, inguinal, left   . Hypertension   . LVH (left ventricular hypertrophy)   . Nephrolithiasis   . Osteopenia   . PVD (peripheral vascular disease) ([place])   . Snoring 02/10/2014   Past Surgical History:  Past Surgical History:  Procedure Laterality Date  . CEA  2000  . HERNIA REPAIR Left    HPI:  77 year old gentleman who was admitted with sudden onset of aphasia and right hemiplegia. CT scan showed a left frontal hemorrhage.    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 Pt is intermittently able to follow one step commands, but motor planning appeared to impact accuracy; distal commands less accurate then proximal oral motor commands. With hand over hand assist to initaite and shape pointing tasks pt had increased accuracy pointing to body parts though movements became perseverative. Pt could not phonate on command though throat clearing and  sighs indicated adequate adduction of vocal folds with involuntary tasks. Used MIT with pts name, counting and singing with pt approximation of labial articulation of initial sounds, but no phonation or  success with words. provided education to pts wife, son and daughter. Pt will benefit from intensive SLP interventions, recommend CIR at d/c. Will follow acutely to target functional communication.     SLP Assessment  SLP Recommendation/Assessment: Patient needs continued Speech Lanaguage Pathology Services SLP Visit Diagnosis: Aphasia (R47.01);Apraxia (R48.2)    Follow Up Recommendations  Inpatient Rehab    Frequency and Duration min 2x/week  2 weeks      SLP Evaluation Cognition  Overall Cognitive Status: Difficult to assess(language impairment) Orientation Level: Other (comment)       Comprehension  Auditory Comprehension Overall Auditory Comprehension: Impaired Yes/No Questions: Impaired Basic Biographical Questions: 0-25% accurate Commands: Impaired One Step Basic Commands: 25-49% accurate Interfering Components: Motor planning EffectiveTechniques: Repetition;Visual/Gestural cues    Expression Verbal Expression Overall Verbal Expression: Impaired Initiation: Impaired Automatic Speech: Name;Social Response;Counting;Singing Repetition: Impaired Level of Impairment: Word level Effective Techniques: Melodic intonation;Phonemic cues;Articulatory cues Written Expression Dominant Hand: Right Written Expression: Not tested   Oral / Motor  Oral Motor/Sensory Function Overall Oral Motor/Sensory Function: Mild impairment Facial ROM: Within Functional Limits Facial Symmetry: Abnormal symmetry right Facial Strength: Suspected CN VII (facial) dysfunction Facial Sensation: Reduced right Lingual ROM: Within Functional Limits Motor Speech Overall Motor Speech: Impaired Phonation: Other (comment)(cannot initiate) Articulation: Impaired Level of Impairment: Word Motor Planning:  Impaired Level of Impairment: Word   GO  Herbie [place], [place] CCC-SLP [phone]  Lynann Beaver 06/03/2017, 12:59 PM

## 2017-06-03 NOTE — Plan of Care (Signed)
Pt seen by speech today and a dysphagia 3 diet was placed.  Pt currently very sleepy and will hold off on feeding until more awake.    Katherine Mantle RN

## 2017-06-03 NOTE — Progress Notes (Signed)
STROKE TEAM PROGRESS NOTE   SUBJECTIVE (INTERVAL HISTORY) His wife and son are at the bedside. Seems more sleepy than yesterday but still waking up for speech evaluation and for Dr. Ronnald Ramp exam. However, he is sleepy on my exam, not following commands and global aphasia, but did open eyes briefly and able to hold up left arm against gravity. No respiratory distress and passed swallow. On 3% saline and Na 144. Will need central line for continued high concentration salt. Need close monitoring.    OBJECTIVE Temp:  [98.6 F (37 C)-98.9 F (37.2 C)] 98.9 F (37.2 C) (05/20 0800) Pulse Rate:  [76-93] 85 (05/20 1100) Resp:  [10-25] 21 (05/20 1100) BP: (119-176)/(58-93) 144/63 (05/20 1100) SpO2:  [90 %-100 %] 92 % (05/20 1100) FiO2 (%):  [21 %] 21 % (05/19 1840) Weight:  [182 lb 1.6 oz (82.6 kg)-186 lb 4.6 oz (84.5 kg)] 182 lb 1.6 oz (82.6 kg) (05/19 1950)  Recent Labs  Lab 06/09/2017 1835  GLUCAP 143*   Recent Labs  Lab 06/13/2017 1755 06/08/2017 1759 06/03/17 0217 06/03/17 0534 06/03/17 0722  NA 142 142 141 142 144  K 4.0 3.8  --   --  3.7  CL 107 105  --   --  105  CO2 25  --   --   --  28  GLUCOSE 136* 132*  --   --  176*  BUN 20 21*  --   --  19  CREATININE 1.25* 1.10  --   --  1.09  CALCIUM 9.5  --   --   --  9.3   Recent Labs  Lab 05/16/2017 1755  AST 25  ALT 26  ALKPHOS 75  BILITOT 1.2  PROT 6.5  ALBUMIN 4.2   Recent Labs  Lab 05/27/2017 1755 06/11/2017 1759 06/03/17 0722  WBC 12.9*  --  18.3*  NEUTROABS 7.0  --   --   HGB 14.7 14.3 14.4  HCT 42.9 42.0 42.1  MCV 94.3  --  96.6  PLT 172  --  190   No results for input(s): CKTOTAL, CKMB, CKMBINDEX, TROPONINI in the last 168 hours. Recent Labs    05/19/2017 1755  LABPROT 13.5  INR 1.04   No results for input(s): COLORURINE, LABSPEC, PHURINE, GLUCOSEU, HGBUR, BILIRUBINUR, KETONESUR, PROTEINUR, UROBILINOGEN, NITRITE, LEUKOCYTESUR in the last 72 hours.  Invalid input(s): APPERANCEUR     Component Value Date/Time    CHOL 111 06/03/2017 0722   TRIG 117 06/03/2017 0722   HDL 46 06/03/2017 0722   CHOLHDL 2.4 06/03/2017 0722   VLDL 23 06/03/2017 0722   LDLCALC 42 06/03/2017 0722   Lab Results  Component Value Date   HGBA1C 5.3 05/16/2017   No results found for: LABOPIA, COCAINSCRNUR, LABBENZ, AMPHETMU, THCU, LABBARB  No results for input(s): ETH in the last 168 hours.  I have personally reviewed the radiological images below and agree with the radiology interpretations.  Ct Angio Head W Or Wo Contrast  Result Date: 05/27/2017 CLINICAL DATA:  Intracranial hemorrhage EXAM: CT ANGIOGRAPHY HEAD AND NECK TECHNIQUE: Multidetector CT imaging of the head and neck was performed using the standard protocol during bolus administration of intravenous contrast. Multiplanar CT image reconstructions and MIPs were obtained to evaluate the vascular anatomy. Carotid stenosis measurements (when applicable) are obtained utilizing NASCET criteria, using the distal internal carotid diameter as the denominator. CONTRAST:  72mL ISOVUE-370 IOPAMIDOL (ISOVUE-370) INJECTION 76% COMPARISON:  CT head immediately preceding FINDINGS: CTA NECK FINDINGS Aortic arch: Mild atherosclerotic  disease aortic arch. Proximal great vessels widely patent Right carotid system: Right common carotid artery widely patent. Right carotid artery endarterectomy widely patent Left carotid system: Left common carotid artery widely patent. Mild atherosclerotic disease. 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 Left external carotid artery widely patent. Vertebral arteries: Both vertebral arteries are widely patent without stenosis. Skeleton: No acute abnormality. Cervical spine degenerative changes. Other neck: Negative for mass or adenopathy. Upper chest: Negative Review of the MIP images confirms the above findings CTA HEAD FINDINGS Anterior circulation: Atherosclerotic calcification right cavernous carotid  with mild stenosis. Right anterior and middle cerebral arteries patent without significant stenosis. 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 Left anterior and middle cerebral arteries patent without stenosis. Posterior circulation: Both vertebral arteries patent to the basilar. Basilar widely patent. PICA, superior cerebellar, posterior cerebral arteries patent without stenosis. Venous sinuses: Patent Anatomic variants: None Delayed phase: Large hematoma left frontal lobe unchanged from preceding CT. Local mass-effect and 4 mm midline shift to the right is unchanged. No enhancing mass lesion identified. Review of the MIP images confirms the above findings IMPRESSION: 1. Large left frontal hematoma. No evidence of underlying vascular malformation or tumor 2. Right carotid endarterectomy widely patent 3. 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 Left cavernous carotid is patent but severely stenotic. Electronically Signed   By: Franchot Gallo M.D.   On: 06/13/2017 18:51   Ct Head Wo Contrast  Result Date: 06/03/2017 CLINICAL DATA:  Cerebral hemorrhage. EXAM: CT HEAD WITHOUT CONTRAST TECHNIQUE: Contiguous axial images were obtained from the base of the skull through the vertex without intravenous contrast. COMPARISON:  CT head without contrast 05/19/2017. CTA head and neck 05/21/2017. FINDINGS: Brain: Left frontal parenchymal hemorrhage has increased in size, now measuring 9.3 x 5.1 x 4.6 cm. Previous measurements were 7.0 x 4.7 x 3.8 cm in the same planes. There is increased mass effect on the left lateral ventricle. No intraventricular hemorrhage is evident. Surrounding edema is noted. Maximal midline shift is 12 mm, increased from 6 mm on the prior exam. There is no hydrocephalus. No downward herniation is present. Basal cisterns are intact.  The brainstem and cerebellum are normal. No new cortical infarcts are present. No separate hemorrhage is evident. Vascular: No hyperdense vessel or unexpected calcification. Skull: Calvarium is intact. No focal lytic or blastic lesions are present. No significant extracranial soft tissue injury is present. Sinuses/Orbits: Right scleral banding is again noted. Right lens replacement is noted. Orbits are otherwise unremarkable. The paranasal sinuses and mastoid air cells are clear. IMPRESSION: 1. Expanding left frontal hematoma with increased mass effect as described. 2. Increasing mass effect on the left lateral ventricle without intraventricular hemorrhage or downward herniation. Critical Value/emergent results were called by telephone at the time of interpretation on 06/03/2017 at 7:32 am to Dr. Lorraine Lax , who verbally acknowledged these results. Electronically Signed   By: San Morelle M.D.   On: 06/03/2017 07:32   Ct Soft Tissue Neck W Contrast  Result Date: 05/28/2017 CLINICAL DATA:  Monoclonal lymphocytosis.  Rule out adenopathy EXAM: CT NECK WITH CONTRAST TECHNIQUE: Multidetector CT imaging of the neck was performed using the standard protocol following the bolus administration of intravenous contrast. CONTRAST:  16mL ISOVUE-300 IOPAMIDOL (ISOVUE-300) INJECTION 61% COMPARISON:  None. FINDINGS: Pharynx and larynx: Normal. No mass or swelling. Salivary  glands: No inflammation, mass, or stone. Thyroid: Negative Lymph nodes: No pathologic or enlarged lymph nodes in the neck. Vascular: Right carotid  endarterectomy is patent. Occluded left internal carotid artery at the origin. Jugular vein patent bilaterally. Limited intracranial: Negative Visualized orbits: Right ocular surgery.  No orbital mass. Mastoids and visualized paranasal sinuses: Negative Skeleton: Degenerative changes in the cervical spine. No acute skeletal lesion. Upper chest: Chest CT from today reported separately Other: None IMPRESSION:  Negative for mass or adenopathy in the neck Occluded left internal carotid artery. Right carotid endarterectomy is patent. Electronically Signed   By: Franchot Gallo M.D.   On: 05/28/2017 16:29   Ct Angio Neck W Or Wo Contrast  Result Date: 05/24/2017 CLINICAL DATA:  Intracranial hemorrhage EXAM: CT ANGIOGRAPHY HEAD AND NECK TECHNIQUE: Multidetector CT imaging of the head and neck was performed using the standard protocol during bolus administration of intravenous contrast. Multiplanar CT image reconstructions and MIPs were obtained to evaluate the vascular anatomy. Carotid stenosis measurements (when applicable) are obtained utilizing NASCET criteria, using the distal internal carotid diameter as the denominator. CONTRAST:  65mL ISOVUE-370 IOPAMIDOL (ISOVUE-370) INJECTION 76% COMPARISON:  CT head immediately preceding FINDINGS: CTA NECK FINDINGS Aortic arch: Mild atherosclerotic disease aortic arch. Proximal great vessels widely patent Right carotid system: Right common carotid artery widely patent. Right carotid artery endarterectomy widely patent Left carotid system: Left common carotid artery widely patent. Mild atherosclerotic disease. 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 Left external carotid artery widely patent. Vertebral arteries: Both vertebral arteries are widely patent without stenosis. Skeleton: No acute abnormality. Cervical spine degenerative changes. Other neck: Negative for mass or adenopathy. Upper chest: Negative Review of the MIP images confirms the above findings CTA HEAD FINDINGS Anterior circulation: Atherosclerotic calcification right cavernous carotid with mild stenosis. Right anterior and middle cerebral arteries patent without significant stenosis. 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 Left anterior and  middle cerebral arteries patent without stenosis. Posterior circulation: Both vertebral arteries patent to the basilar. Basilar widely patent. PICA, superior cerebellar, posterior cerebral arteries patent without stenosis. Venous sinuses: Patent Anatomic variants: None Delayed phase: Large hematoma left frontal lobe unchanged from preceding CT. Local mass-effect and 4 mm midline shift to the right is unchanged. No enhancing mass lesion identified. Review of the MIP images confirms the above findings IMPRESSION: 1. Large left frontal hematoma. No evidence of underlying vascular malformation or tumor 2. Right carotid endarterectomy widely patent 3. 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 Left cavernous carotid is patent but severely stenotic. Electronically Signed   By: Franchot Gallo M.D.   On: 06/11/2017 18:51   Ct Chest W Contrast  Result Date: 05/29/2017 CLINICAL DATA:  77 year old male for initial staging of chronic lymphocytic leukemia. EXAM: CT CHEST, ABDOMEN, AND PELVIS WITH CONTRAST TECHNIQUE: Multidetector CT imaging of the chest, abdomen and pelvis was performed following the standard protocol during bolus administration of intravenous contrast. CONTRAST:  166mL ISOVUE-300 IOPAMIDOL (ISOVUE-300) INJECTION 61% COMPARISON:  01/28/2004 chest CT, 06/29/2009 abdominal and pelvic CT and prior studies FINDINGS: CT CHEST FINDINGS Cardiovascular: Heart size is normal. Multivessel coronary artery calcifications identified. Thoracic aortic atherosclerotic calcifications noted without aneurysm. No pericardial effusion. Mediastinum/Nodes: No enlarged mediastinal, hilar, or axillary lymph nodes. Thyroid gland, trachea, and esophagus demonstrate no significant findings. Lungs/Pleura: Minimal dependent atelectasis/interstitial prominence is  unchanged. Minimal LEFT basilar scarring again noted. No airspace disease, consolidation, mass, nodule, pleural effusion or  pneumothorax noted. Musculoskeletal: No chest wall mass or suspicious bone lesions identified. CT ABDOMEN PELVIS FINDINGS Hepatobiliary: The liver and gallbladder are unremarkable. No biliary dilatation. Pancreas: Unremarkable Spleen: Unremarkable Adrenals/Urinary Tract: Kidneys, adrenal glands and bladder are unremarkable except for nonobstructing RIGHT renal calculi. The largest RIGHT renal calculus measures 10 x 16 mm within the mid RIGHT kidney. Stomach/Bowel: Stomach is within normal limits. No evidence of bowel wall thickening, distention, or inflammatory changes. Vascular/Lymphatic: Aortic atherosclerosis. No enlarged abdominal or pelvic lymph nodes. Reproductive: Moderate to marked prostate enlargement noted. Other: No ascites, focal collection or pneumoperitoneum. Musculoskeletal: No acute or suspicious bony abnormalities. Degenerative changes within the lumbar spine again noted. Wedge compression fracture of L1 is unchanged. IMPRESSION: 1. No evidence of lymphadenopathy, splenomegaly or metastatic disease. 2. No acute abnormalities. 3. Nonobstructing RIGHT renal calculi 4. Moderate to marked prostate enlargement 5. Coronary artery and Aortic Atherosclerosis (ICD10-I70.0). Electronically Signed   By: Margarette Canada M.D.   On: 05/29/2017 08:57   Ct Abdomen Pelvis W Contrast  Result Date: 05/29/2017 CLINICAL DATA:  77 year old male for initial staging of chronic lymphocytic leukemia. EXAM: CT CHEST, ABDOMEN, AND PELVIS WITH CONTRAST TECHNIQUE: Multidetector CT imaging of the chest, abdomen and pelvis was performed following the standard protocol during bolus administration of intravenous contrast. CONTRAST:  169mL ISOVUE-300 IOPAMIDOL (ISOVUE-300) INJECTION 61% COMPARISON:  01/28/2004 chest CT, 06/29/2009 abdominal and pelvic CT and prior studies FINDINGS: CT CHEST FINDINGS Cardiovascular: Heart size is normal. Multivessel coronary artery calcifications identified. Thoracic aortic atherosclerotic  calcifications noted without aneurysm. No pericardial effusion. Mediastinum/Nodes: No enlarged mediastinal, hilar, or axillary lymph nodes. Thyroid gland, trachea, and esophagus demonstrate no significant findings. Lungs/Pleura: Minimal dependent atelectasis/interstitial prominence is unchanged. Minimal LEFT basilar scarring again noted. No airspace disease, consolidation, mass, nodule, pleural effusion or pneumothorax noted. Musculoskeletal: No chest wall mass or suspicious bone lesions identified. CT ABDOMEN PELVIS FINDINGS Hepatobiliary: The liver and gallbladder are unremarkable. No biliary dilatation. Pancreas: Unremarkable Spleen: Unremarkable Adrenals/Urinary Tract: Kidneys, adrenal glands and bladder are unremarkable except for nonobstructing RIGHT renal calculi. The largest RIGHT renal calculus measures 10 x 16 mm within the mid RIGHT kidney. Stomach/Bowel: Stomach is within normal limits. No evidence of bowel wall thickening, distention, or inflammatory changes. Vascular/Lymphatic: Aortic atherosclerosis. No enlarged abdominal or pelvic lymph nodes. Reproductive: Moderate to marked prostate enlargement noted. Other: No ascites, focal collection or pneumoperitoneum. Musculoskeletal: No acute or suspicious bony abnormalities. Degenerative changes within the lumbar spine again noted. Wedge compression fracture of L1 is unchanged. IMPRESSION: 1. No evidence of lymphadenopathy, splenomegaly or metastatic disease. 2. No acute abnormalities. 3. Nonobstructing RIGHT renal calculi 4. Moderate to marked prostate enlargement 5. Coronary artery and Aortic Atherosclerosis (ICD10-I70.0). Electronically Signed   By: Margarette Canada M.D.   On: 05/29/2017 08:57   Ct Head Code Stroke Wo Contrast  Result Date: 06/03/2017 CLINICAL DATA:  Code stroke. Altered level of consciousness. Aphasia EXAM: CT HEAD WITHOUT CONTRAST TECHNIQUE: Contiguous axial images were obtained from the base of the skull through the vertex without  intravenous contrast. COMPARISON:  None. FINDINGS: Brain: Large area of high-density hemorrhage in the left frontal lobe with mixed densities. This measures 6.4 x 3.9 x 5.0 cm corresponding to 61 mL of blood. Surrounding edema. There is mass-effect and 4 mm midline shift to the right. No subarachnoid or subdural hemorrhage Mild atrophy.  No other areas of hemorrhage or acute infarct Vascular:  Normal arterial density. Skull: Negative Sinuses/Orbits: Paranasal sinuses show mild mucosal edema. Right cataract surgery Other: None ASPECTS ([place] Stroke Program Early CT Score) - Ganglionic level infarction (caudate, lentiform nuclei, internal capsule, insula, M1-M3 cortex): 7 - Supraganglionic infarction (M4-M6 cortex): 3 Total score (0-10 with 10 being normal): 10 IMPRESSION: 1. Large hemorrhage in the left frontal lobe, 61 mL. Differential diagnostic considerations include amyloid, hypertension, vascular malformation. Hemorrhagic stroke not considered likely with acute onset of symptoms. 2. ASPECTS is 10 3. These results were called by telephone at the time of interpretation on 06/01/2017 at 6:16 pm to Dr. Rory Percy , who verbally acknowledged these results. Electronically Signed   By: Franchot Gallo M.D.   On: 05/20/2017 18:17   TTE pending   PHYSICAL EXAM  Temp:  [98.6 F (37 C)-98.9 F (37.2 C)] 98.9 F (37.2 C) (05/20 0800) Pulse Rate:  [76-93] 85 (05/20 1100) Resp:  [10-25] 21 (05/20 1100) BP: (119-176)/(58-93) 144/63 (05/20 1100) SpO2:  [90 %-100 %] 92 % (05/20 1100) FiO2 (%):  [21 %] 21 % (05/19 1840) Weight:  [182 lb 1.6 oz (82.6 kg)-186 lb 4.6 oz (84.5 kg)] 182 lb 1.6 oz (82.6 kg) (05/19 1950)  General - Well nourished, well developed, sleepy.  Ophthalmologic - fundi not visualized due to noncooperation.  Cardiovascular - Regular rate and rhythm.  Neuro - sleepy drowsy, but able to briefly open eyes on voice. Global aphasia, not following commands. PERRL, left gaze preference, barely cross  midline. Inconsistently blinking to visual threat bilaterally. Right facial droop and tongue midline in mouth. LUE spontaneous movement against gravity able to hold up on exam. BLE withdraw 2+/5 on pain stimulation. RUE 1/5 withdraw on pain. DTR 1+ and right babinski positive. Sensation, coordination and gait not tested.   ASSESSMENT/PLAN Jason Callahan is a 77 y.o. 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 No tPA given due to [place].    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  Leukocytosis following with oncology  WBC 18.3  Pan CT negative so far  Continue outpt follow up with oncology  Hypertension Stable  BP goal < 140  On cleviprex  Add losartan 100mg  daily  Close monitoring  Hyperlipidemia  Home meds:  crestor   LDL 42, goal < 70  Hold off statin for now due to acute ICH  Other Stroke Risk Factors  Advanced age  PVD  Obstructive sleep  apnea, on CPAP at home  Other Active  Problems  hyperglycemia  Hospital day # 1  This patient is critically ill due to large right ICH, cerebral edema, HTN, carotid occlusion and at significant risk of neurological worsening, death form cerebral edema, brain herniation, heart failure, aspiration pneumonia. This patient's care requires constant monitoring of vital signs, hemodynamics, respiratory and cardiac monitoring, review of multiple databases, neurological assessment, discussion with family, other specialists and medical decision making of high complexity. I spent 40 minutes of neurocritical care time in the care of this patient. I had long discussion with wife and son at bedside, updated pt current condition, treatment plan and potential prognosis. They expressed understanding and appreciation.    Rosalin Hawking, MD PhD Stroke Neurology 06/03/2017 11:19 AM    To contact Stroke Continuity provider, please refer to [email or web address] After hours, contact General Neurology

## 2017-06-04 ENCOUNTER — Inpatient Hospital Stay (HOSPITAL_COMMUNITY): Payer: PPO

## 2017-06-04 DIAGNOSIS — I161 Hypertensive emergency: Secondary | ICD-10-CM

## 2017-06-04 LAB — CBC
HEMATOCRIT: 40.7 % (ref 39.0–52.0)
Hemoglobin: 13.4 g/dL (ref 13.0–17.0)
MCH: 32.4 pg (ref 26.0–34.0)
MCHC: 32.9 g/dL (ref 30.0–36.0)
MCV: 98.5 fL (ref 78.0–100.0)
Platelets: 172 10*3/uL (ref 150–400)
RBC: 4.13 MIL/uL — AB (ref 4.22–5.81)
RDW: 13.2 % (ref 11.5–15.5)
WBC: 19.5 10*3/uL — AB (ref 4.0–10.5)

## 2017-06-04 LAB — URINALYSIS, COMPLETE (UACMP) WITH MICROSCOPIC
Bacteria, UA: NONE SEEN
Bilirubin Urine: NEGATIVE
GLUCOSE, UA: NEGATIVE mg/dL
HGB URINE DIPSTICK: NEGATIVE
Ketones, ur: NEGATIVE mg/dL
LEUKOCYTES UA: NEGATIVE
Nitrite: NEGATIVE
Protein, ur: NEGATIVE mg/dL
Specific Gravity, Urine: 1.013 (ref 1.005–1.030)
pH: 5 (ref 5.0–8.0)

## 2017-06-04 LAB — SODIUM
SODIUM: 153 mmol/L — AB (ref 135–145)
SODIUM: 154 mmol/L — AB (ref 135–145)
Sodium: 156 mmol/L — ABNORMAL HIGH (ref 135–145)

## 2017-06-04 LAB — ECHOCARDIOGRAM COMPLETE
Height: 70 in
WEIGHTICAEL: 2913.6 [oz_av]

## 2017-06-04 LAB — BASIC METABOLIC PANEL
ANION GAP: 5 (ref 5–15)
BUN: 18 mg/dL (ref 6–20)
CALCIUM: 8.8 mg/dL — AB (ref 8.9–10.3)
CO2: 28 mmol/L (ref 22–32)
Chloride: 120 mmol/L — ABNORMAL HIGH (ref 101–111)
Creatinine, Ser: 1.15 mg/dL (ref 0.61–1.24)
GFR calc non Af Amer: 60 mL/min — ABNORMAL LOW (ref >60)
Glucose, Bld: 174 mg/dL — ABNORMAL HIGH (ref 65–99)
POTASSIUM: 3.6 mmol/L (ref 3.5–5.1)
Sodium: 153 mmol/L — ABNORMAL HIGH (ref 135–145)

## 2017-06-04 MED ORDER — LABETALOL HCL 100 MG PO TABS
100.0000 mg | ORAL_TABLET | Freq: Three times a day (TID) | ORAL | Status: DC
  Administered 2017-06-04 – 2017-06-05 (×3): 100 mg via ORAL
  Filled 2017-06-04 (×4): qty 1

## 2017-06-04 MED ORDER — LABETALOL HCL 5 MG/ML IV SOLN
10.0000 mg | Freq: Once | INTRAVENOUS | Status: AC
  Administered 2017-06-04: 10 mg via INTRAVENOUS

## 2017-06-04 MED ORDER — LABETALOL HCL 5 MG/ML IV SOLN
10.0000 mg | INTRAVENOUS | Status: DC | PRN
  Administered 2017-06-05 – 2017-06-06 (×2): 20 mg via INTRAVENOUS
  Filled 2017-06-04 (×4): qty 4

## 2017-06-04 MED ORDER — AMLODIPINE BESYLATE 10 MG PO TABS
10.0000 mg | ORAL_TABLET | Freq: Every day | ORAL | Status: DC
  Administered 2017-06-04 – 2017-06-13 (×9): 10 mg via ORAL
  Filled 2017-06-04 (×10): qty 1

## 2017-06-04 MED ORDER — SODIUM CHLORIDE 0.9 % IV SOLN
INTRAVENOUS | Status: DC
  Administered 2017-06-04 – 2017-06-07 (×4): via INTRAVENOUS

## 2017-06-04 NOTE — Evaluation (Signed)
Physical Therapy Evaluation Patient Details Name: Jason Callahan MRN: [id number] DOB: [birth year] Today's Date: 06/04/2017   History of Present Illness  This 77 y.o. male admitted with acute onset Rt sided weakness, Rt facial droop, and aphasia.   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  PMH includes:  PVD, LVH, Gout. s/p CEA.    Clinical Impression  Pt admitted with/for s/s of stroke stated above due to L frontal intraparenchymal hemorrhage.  Pt needed significant assist during the evaluation today with pt having trouble with focusing attention on task.  Pt currently limited functionally due to the problems listed. ( See problems list.)   Pt will benefit from PT to maximize function and safety in order to get ready for next venue listed below.     Follow Up Recommendations CIR;Supervision/Assistance - 24 hour    Equipment Recommendations  Other (comment)(TBA)    Recommendations for Other Services Rehab consult     Precautions / Restrictions Precautions Precautions: Fall      Mobility  Bed Mobility Overal bed mobility: Needs Assistance Bed Mobility: Supine to Sit     Supine to sit: +2 for physical assistance;Total assist     General bed mobility comments: Pt required assist with all aspects of bed mobility due to lethargy    Transfers Overall transfer level: Needs assistance   Transfers: Sit to/from Stand;Stand Pivot Transfers Sit to Stand: Mod assist;+2 physical assistance Stand pivot transfers: Max assist;+2 safety/equipment       General transfer comment: pt required facilitation for anterior translation of trunk as well as facilitation at ischium and trunk for extension   Ambulation/Gait             General Gait Details: NT  Stairs            Wheelchair Mobility    Modified Rankin (Stroke Patients Only) Modified Rankin (Stroke Patients Only) Pre-Morbid Rankin Score: No symptoms Modified Rankin: Severe  disability     Balance Overall balance assessment: Needs assistance Sitting-balance support: Feet supported Sitting balance-Leahy Scale: Poor Sitting balance - Comments: requires mod - max a to maintain EOB sittng.  Pt pushes with Rt UE  Postural control: Right lateral lean Standing balance support: No upper extremity supported Standing balance-Leahy Scale: Poor Standing balance comment: requires mod A +2 to maintain static standing.  passively leans to the Rt                              Pertinent Vitals/Pain Pain Assessment: Faces Faces Pain Scale: No hurt    Home Living Family/patient expects to be discharged to:: Private residence Living Arrangements: Spouse/significant other Available Help at Discharge: Family Type of Home: House Home Access: Stairs to enter Entrance Stairs-Rails: None Entrance Stairs-Number of Steps: 2 Home Layout: Two level;Able to live on main level with bedroom/bathroom Home Equipment: None      Prior Function Level of Independence: Independent               Hand Dominance   Dominant Hand: Right    Extremity/Trunk Assessment   Upper Extremity Assessment Upper Extremity Assessment: Defer to OT evaluation RUE Deficits / Details: appears flaccid.  No active movement noted  RUE Coordination: decreased fine motor;decreased gross motor    Lower Extremity Assessment Lower Extremity Assessment: RLE deficits/detail;LLE deficits/detail RLE Deficits / Details: no voluntary movement noted RLE Coordination: decreased fine motor;decreased gross motor LLE Deficits /  Details: weak  voluntary movement, needing multimodal cues to get minimal movement during MMT LLE Coordination: decreased fine motor    Cervical / Trunk Assessment Cervical / Trunk Assessment: Other exceptions Cervical / Trunk Exceptions: Decreased activation of musculature on the Rt   Communication   Communication: Expressive difficulties;Receptive difficulties   Cognition Arousal/Alertness: Lethargic Behavior During Therapy: Flat affect Overall Cognitive Status: Impaired/Different from baseline Area of Impairment: Attention                   Current Attention Level: Focused           General Comments: Pt did not follow any commands.  Kept eyes closed majority of the time       General Comments General comments (skin integrity, edema, etc.): vss    Exercises     Assessment/Plan    PT Assessment Patient needs continued PT services  PT Problem List Decreased strength;Decreased activity tolerance;Decreased balance;Decreased mobility;Decreased coordination;Decreased knowledge of use of DME       PT Treatment Interventions Functional mobility training;Therapeutic activities;Neuromuscular re-education;Balance training;Patient/family education;Gait training;DME instruction    PT Goals (Current goals can be found in the Care Plan section)  Acute Rehab PT Goals Patient Stated Goal: for pt to regain as much function as possible  PT Goal Formulation: With patient Time For Goal Achievement: 06/18/17 Potential to Achieve Goals: Good    Frequency Min 4X/week   Barriers to discharge        Co-evaluation               AM-PAC PT "6 Clicks" Daily Activity  Outcome Measure Difficulty turning over in bed (including adjusting bedclothes, sheets and blankets)?: Unable Difficulty moving from lying on back to sitting on the side of the bed? : Unable Difficulty sitting down on and standing up from a chair with arms (e.g., wheelchair, bedside commode, etc,.)?: Unable Help needed moving to and from a bed to chair (including a wheelchair)?: A Lot Help needed walking in hospital room?: Total Help needed climbing 3-5 steps with a railing? 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

## 2017-06-04 NOTE — Progress Notes (Signed)
  Echocardiogram 2D Echocardiogram has been performed.  06/04/2017, 11:22 AM

## 2017-06-04 NOTE — Progress Notes (Signed)
STROKE TEAM PROGRESS NOTE   SUBJECTIVE (INTERVAL HISTORY) His Jason Callahan and Jason Callahan are at the bedside. As per Jason Callahan, pt was with good mental status last evening, smile and responding to granddaughter. And at night, he was restless due to discomfortable positions in bed. Repeat CT head this am showed smaller ICH with swelling and stable midline shift. However, he had spiking fever 101.2.    OBJECTIVE Temp:  [98.4 F (36.9 C)-101.2 F (38.4 C)] 101.2 F (38.4 C) (05/21 0700) Pulse Rate:  [78-98] 93 (05/21 1000) Cardiac Rhythm: Normal sinus rhythm (05/21 0800) Resp:  [10-28] 25 (05/21 1000) BP: (122-153)/(51-90) 133/90 (05/21 1000) SpO2:  [90 %-100 %] 91 % (05/21 1000)  Recent Labs  Lab 06/06/2017 1835  GLUCAP 143*   Recent Labs  Lab 05/24/2017 1755 05/17/2017 1759  06/03/17 0722 06/03/17 1144 06/03/17 1637 06/03/17 2346 06/04/17 0430  NA 142 142   < > 144 144 147* 153* 153*  K 4.0 3.8  --  3.7  --   --   --  3.6  CL 107 105  --  105  --   --   --  120*  CO2 25  --   --  28  --   --   --  28  GLUCOSE 136* 132*  --  176*  --   --   --  174*  BUN 20 21*  --  19  --   --   --  18  CREATININE 1.25* 1.10  --  1.09  --   --   --  1.15  CALCIUM 9.5  --   --  9.3  --   --   --  8.8*   < > = values in this interval not displayed.   Recent Labs  Lab 05/29/2017 1755  AST 25  ALT 26  ALKPHOS 75  BILITOT 1.2  PROT 6.5  ALBUMIN 4.2   Recent Labs  Lab 05/28/2017 1755 05/23/2017 1759 06/03/17 0722 06/04/17 0430  WBC 12.9*  --  18.3* 19.5*  NEUTROABS 7.0  --   --   --   HGB 14.7 14.3 14.4 13.4  HCT 42.9 42.0 42.1 40.7  MCV 94.3  --  96.6 98.5  PLT 172  --  190 172   No results for input(s): CKTOTAL, CKMB, CKMBINDEX, TROPONINI in the last 168 hours. Recent Labs    05/27/2017 1755  LABPROT 13.5  INR 1.04   No results for input(s): COLORURINE, LABSPEC, PHURINE, GLUCOSEU, HGBUR, BILIRUBINUR, KETONESUR, PROTEINUR, UROBILINOGEN, NITRITE, LEUKOCYTESUR in the last 72 hours.  Invalid input(s):  APPERANCEUR     Component Value Date/Time   CHOL 111 06/03/2017 0722   TRIG 117 06/03/2017 0722   HDL 46 06/03/2017 0722   CHOLHDL 2.4 06/03/2017 0722   VLDL 23 06/03/2017 0722   LDLCALC 42 06/03/2017 0722   Lab Results  Component Value Date   HGBA1C 5.3 05/20/2017   No results found for: LABOPIA, COCAINSCRNUR, LABBENZ, AMPHETMU, THCU, LABBARB  No results for input(s): ETH in the last 168 hours.  I have personally reviewed the radiological images below and agree with the radiology interpretations.  Ct Angio Head W Or Wo Contrast  Result Date: 05/20/2017 CLINICAL DATA:  Intracranial hemorrhage EXAM: CT ANGIOGRAPHY HEAD AND NECK TECHNIQUE: Multidetector CT imaging of the head and neck was performed using the standard protocol during bolus administration of intravenous contrast. Multiplanar CT image reconstructions and MIPs were obtained to evaluate the vascular anatomy. Carotid stenosis measurements (  when applicable) are obtained utilizing NASCET criteria, using the distal internal carotid diameter as the denominator. CONTRAST:  24mL ISOVUE-370 IOPAMIDOL (ISOVUE-370) INJECTION 76% COMPARISON:  CT head immediately preceding FINDINGS: CTA NECK FINDINGS Aortic arch: Mild atherosclerotic disease aortic arch. Proximal great vessels widely patent Right carotid system: Right common carotid artery widely patent. Right carotid artery endarterectomy widely patent Left carotid system: Left common carotid artery widely patent. Mild atherosclerotic disease. 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 Left external carotid artery widely patent. Vertebral arteries: Both vertebral arteries are widely patent without stenosis. Skeleton: No acute abnormality. Cervical spine degenerative changes. Other neck: Negative for mass or adenopathy. Upper chest: Negative Review of the MIP images confirms the above findings CTA HEAD FINDINGS Anterior circulation:  Atherosclerotic calcification right cavernous carotid with mild stenosis. Right anterior and middle cerebral arteries patent without significant stenosis. 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 Left anterior and middle cerebral arteries patent without stenosis. Posterior circulation: Both vertebral arteries patent to the basilar. Basilar widely patent. PICA, superior cerebellar, posterior cerebral arteries patent without stenosis. Venous sinuses: Patent Anatomic variants: None Delayed phase: Large hematoma left frontal lobe unchanged from preceding CT. Local mass-effect and 4 mm midline shift to the right is unchanged. No enhancing mass lesion identified. Review of the MIP images confirms the above findings IMPRESSION: 1. Large left frontal hematoma. No evidence of underlying vascular malformation or tumor 2. Right carotid endarterectomy widely patent 3. 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 Left cavernous carotid is patent but severely stenotic. Electronically Signed   By: Franchot Gallo M.D.   On: 05/26/2017 18:51   Ct Head Wo Contrast  Result Date: 06/04/2017 CLINICAL DATA:  Intracranial hemorrhage follow-up EXAM: CT HEAD WITHOUT CONTRAST TECHNIQUE: Contiguous axial images were obtained from the base of the skull through the vertex without intravenous contrast. COMPARISON:  06/03/2017 FINDINGS: Brain: Large area of hemorrhage in the left frontal lobe is slightly smaller, likely due to decompression into the ventricles. Small amount of intraventricular hemorrhage is now noted. Subarachnoid hemorrhage on the left is unchanged in volume. No new area of hemorrhage. There is mass-effect and 6.5 mm midline shift to the right. Negative for hydrocephalus. No other acute findings Vascular: Negative for hyperdense vessel Skull: Negative  Sinuses/Orbits: Paranasal sinuses clear. Prior ocular surgery on the right. Other: None IMPRESSION: Large intraparenchymal hemorrhage in the left frontal lobe slightly smaller, likely due to decompression the ventricles. Small amount of ventricular hemorrhage noted. Subarachnoid hemorrhage on the left is unchanged 6.5 mm midline shift towards the right is unchanged. Electronically Signed   By: Franchot Gallo M.D.   On: 06/04/2017 09:29   Ct Head Wo Contrast  Result Date: 06/03/2017 CLINICAL DATA:  Cerebral hemorrhage. EXAM: CT HEAD WITHOUT CONTRAST TECHNIQUE: Contiguous axial images were obtained from the base of the skull through the vertex without intravenous contrast. COMPARISON:  CT head without contrast 05/28/2017. CTA head and neck 06/13/2017. FINDINGS: Brain: Left frontal parenchymal hemorrhage has increased in size, now measuring 9.3 x 5.1 x 4.6 cm. Previous measurements were 7.0 x 4.7 x 3.8 cm in the same planes. There is increased mass effect on the left lateral ventricle. No intraventricular hemorrhage is evident. Surrounding edema is noted. Maximal midline shift is 12 mm, increased from 6 mm on the prior exam. There is no  hydrocephalus. No downward herniation is present. Basal cisterns are intact. The brainstem and cerebellum are normal. No new cortical infarcts are present. No separate hemorrhage is evident. Vascular: No hyperdense vessel or unexpected calcification. Skull: Calvarium is intact. No focal lytic or blastic lesions are present. No significant extracranial soft tissue injury is present. Sinuses/Orbits: Right scleral banding is again noted. Right lens replacement is noted. Orbits are otherwise unremarkable. The paranasal sinuses and mastoid air cells are clear. IMPRESSION: 1. Expanding left frontal hematoma with increased mass effect as described. 2. Increasing mass effect on the left lateral ventricle without intraventricular hemorrhage or downward herniation. Critical Value/emergent  results were called by telephone at the time of interpretation on 06/03/2017 at 7:32 am to Dr. Lorraine Lax , who verbally acknowledged these results. Electronically Signed   By: San Morelle M.D.   On: 06/03/2017 07:32   Ct Soft Tissue Neck W Contrast  Result Date: 05/28/2017 CLINICAL DATA:  Monoclonal lymphocytosis.  Rule out adenopathy EXAM: CT NECK WITH CONTRAST TECHNIQUE: Multidetector CT imaging of the neck was performed using the standard protocol following the bolus administration of intravenous contrast. CONTRAST:  168mL ISOVUE-300 IOPAMIDOL (ISOVUE-300) INJECTION 61% COMPARISON:  None. FINDINGS: Pharynx and larynx: Normal. No mass or swelling. Salivary glands: No inflammation, mass, or stone. Thyroid: Negative Lymph nodes: No pathologic or enlarged lymph nodes in the neck. Vascular: Right carotid  endarterectomy is patent. Occluded left internal carotid artery at the origin. Jugular vein patent bilaterally. Limited intracranial: Negative Visualized orbits: Right ocular surgery.  No orbital mass. Mastoids and visualized paranasal sinuses: Negative Skeleton: Degenerative changes in the cervical spine. No acute skeletal lesion. Upper chest: Chest CT from today reported separately Other: None IMPRESSION: Negative for mass or adenopathy in the neck Occluded left internal carotid artery. Right carotid endarterectomy is patent. Electronically Signed   By: Franchot Gallo M.D.   On: 05/28/2017 16:29   Ct Angio Neck W Or Wo Contrast  Result Date: 05/28/2017 CLINICAL DATA:  Intracranial hemorrhage EXAM: CT ANGIOGRAPHY HEAD AND NECK TECHNIQUE: Multidetector CT imaging of the head and neck was performed using the standard protocol during bolus administration of intravenous contrast. Multiplanar CT image reconstructions and MIPs were obtained to evaluate the vascular anatomy. Carotid stenosis measurements (when applicable) are obtained utilizing NASCET criteria, using the distal internal carotid diameter as the  denominator. CONTRAST:  57mL ISOVUE-370 IOPAMIDOL (ISOVUE-370) INJECTION 76% COMPARISON:  CT head immediately preceding FINDINGS: CTA NECK FINDINGS Aortic arch: Mild atherosclerotic disease aortic arch. Proximal great vessels widely patent Right carotid system: Right common carotid artery widely patent. Right carotid artery endarterectomy widely patent Left carotid system: Left common carotid artery widely patent. Mild atherosclerotic disease. 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 Left external carotid artery widely patent. Vertebral arteries: Both vertebral arteries are widely patent without stenosis. Skeleton: No acute abnormality. Cervical spine degenerative changes. Other neck: Negative for mass or adenopathy. Upper chest: Negative Review of the MIP images confirms the above findings CTA HEAD FINDINGS Anterior circulation: Atherosclerotic calcification right cavernous carotid with mild stenosis. Right anterior and middle cerebral arteries patent without significant stenosis. 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 Left anterior and middle cerebral arteries patent without stenosis. Posterior circulation: Both vertebral arteries patent to the basilar. Basilar widely patent. PICA, superior cerebellar, posterior cerebral arteries patent without stenosis. Venous sinuses: Patent Anatomic variants: None Delayed phase:  Large hematoma left frontal lobe unchanged from preceding CT. Local mass-effect and 4 mm midline shift to the right is unchanged. No enhancing mass lesion identified. Review of the MIP images confirms the above findings IMPRESSION: 1. Large left frontal hematoma. No evidence of underlying vascular malformation or tumor 2. Right carotid endarterectomy widely patent 3. Left internal carotid artery occluded in the neck with  reconstitution in the cavernous segment. Left cavernous carotid is patent but severely stenotic. Electronically Signed   By: Franchot Gallo M.D.   On: 05/23/2017 18:51   Ct Chest W Contrast  Result Date: 05/29/2017 CLINICAL DATA:  77 year old male for initial staging of chronic lymphocytic leukemia. EXAM: CT CHEST, ABDOMEN, AND PELVIS WITH CONTRAST TECHNIQUE: Multidetector CT imaging of the chest, abdomen and pelvis was performed following the standard protocol during bolus administration of intravenous contrast. CONTRAST:  172mL ISOVUE-300 IOPAMIDOL (ISOVUE-300) INJECTION 61% COMPARISON:  01/28/2004 chest CT, 06/29/2009 abdominal and pelvic CT and prior studies FINDINGS: CT CHEST FINDINGS Cardiovascular: Heart size is normal. Multivessel coronary artery calcifications identified. Thoracic aortic atherosclerotic calcifications noted without aneurysm. No pericardial effusion. Mediastinum/Nodes: No enlarged mediastinal, hilar, or axillary lymph nodes. Thyroid gland, trachea, and esophagus demonstrate no significant findings. Lungs/Pleura: Minimal dependent atelectasis/interstitial prominence is unchanged. Minimal LEFT basilar scarring again noted. No airspace disease, consolidation, mass, nodule, pleural effusion or pneumothorax noted. Musculoskeletal: No chest wall mass or suspicious bone lesions identified. CT ABDOMEN PELVIS FINDINGS Hepatobiliary: The liver and gallbladder are unremarkable. No biliary dilatation. Pancreas: Unremarkable Spleen: Unremarkable Adrenals/Urinary Tract: Kidneys, adrenal glands and bladder are unremarkable except for nonobstructing RIGHT renal calculi. The largest RIGHT renal calculus measures 10 x 16 mm within the mid RIGHT kidney. Stomach/Bowel: Stomach is within normal limits. No evidence of bowel wall thickening, distention, or inflammatory changes. Vascular/Lymphatic: Aortic atherosclerosis. No enlarged abdominal or pelvic lymph nodes. Reproductive: Moderate to marked prostate  enlargement noted. Other: No ascites, focal collection or pneumoperitoneum. Musculoskeletal: No acute or suspicious bony abnormalities. Degenerative changes within the lumbar spine again noted. Wedge compression fracture of L1 is unchanged. IMPRESSION: 1. No evidence of lymphadenopathy, splenomegaly or metastatic disease. 2. No acute abnormalities. 3. Nonobstructing RIGHT renal calculi 4. Moderate to marked prostate enlargement 5. Coronary artery and Aortic Atherosclerosis (ICD10-I70.0). Electronically Signed   By: Margarette Canada M.D.   On: 05/29/2017 08:57   [name] VZ Contrast  Result Date: 06/03/2017 CLINICAL DATA:  Cerebral hemorrhage. EXAM: MRI HEAD WITHOUT AND WITH CONTRAST TECHNIQUE: Multiplanar, multiecho pulse sequences of the brain and surrounding structures were obtained without and with intravenous contrast. CONTRAST:  25mL MULTIHANCE GADOBENATE DIMEGLUMINE 529 MG/ML IV SOLN COMPARISON:  Head CT 06/03/2017 at 0609 hours FINDINGS: Brain: 9.6 x 5.2 x 4.7 cm left frontal lobe hematoma has not significantly changed in size. There is mild surrounding edema with unchanged mass effect on the left lateral ventricle. 12 mm of rightward midline shift is unchanged. A small amount of adjacent left frontal subarachnoid hemorrhage is again seen. There is trace hemorrhage in the occipital horn of the right lateral ventricle. No acute infarct is evident separate from the left frontal hematoma. No underlying enhancing mass lesion is evident. No chronic brain hemorrhages are identified. There is no extra-axial fluid collection. Mild cerebral atrophy is within normal limits for age. A few punctate foci of cerebral white matter T2 hyperintensity are also within normal limits for age. Vascular: Major intracranial vascular flow voids are preserved. Skull and upper cervical spine: Unremarkable bone marrow signal. Sinuses/Orbits: Right cataract extraction  and scleral banding. No significant sinus disease or significant  mastoid fluid. Other: None. IMPRESSION: 1. Large left frontal lobe hematoma, unchanged from today's earlier CT. Unchanged mass effect with 12 mm of midline shift. No evidence of underlying mass. No chronic hemorrhages to suggest cerebral amyloid angiopathy. 2. Small volume adjacent subarachnoid hemorrhage and trace intraventricular hemorrhage. No evidence of hydrocephalus. Electronically Signed   By: Logan Bores M.D.   On: 06/03/2017 16:43   Ct Abdomen Pelvis W Contrast  Result Date: 05/29/2017 CLINICAL DATA:  77 year old male for initial staging of chronic lymphocytic leukemia. EXAM: CT CHEST, ABDOMEN, AND PELVIS WITH CONTRAST TECHNIQUE: Multidetector CT imaging of the chest, abdomen and pelvis was performed following the standard protocol during bolus administration of intravenous contrast. CONTRAST:  160mL ISOVUE-300 IOPAMIDOL (ISOVUE-300) INJECTION 61% COMPARISON:  01/28/2004 chest CT, 06/29/2009 abdominal and pelvic CT and prior studies FINDINGS: CT CHEST FINDINGS Cardiovascular: Heart size is normal. Multivessel coronary artery calcifications identified. Thoracic aortic atherosclerotic calcifications noted without aneurysm. No pericardial effusion. Mediastinum/Nodes: No enlarged mediastinal, hilar, or axillary lymph nodes. Thyroid gland, trachea, and esophagus demonstrate no significant findings. Lungs/Pleura: Minimal dependent atelectasis/interstitial prominence is unchanged. Minimal LEFT basilar scarring again noted. No airspace disease, consolidation, mass, nodule, pleural effusion or pneumothorax noted. Musculoskeletal: No chest wall mass or suspicious bone lesions identified. CT ABDOMEN PELVIS FINDINGS Hepatobiliary: The liver and gallbladder are unremarkable. No biliary dilatation. Pancreas: Unremarkable Spleen: Unremarkable Adrenals/Urinary Tract: Kidneys, adrenal glands and bladder are unremarkable except for nonobstructing RIGHT renal calculi. The largest RIGHT renal calculus measures 10 x 16 mm  within the mid RIGHT kidney. Stomach/Bowel: Stomach is within normal limits. No evidence of bowel wall thickening, distention, or inflammatory changes. Vascular/Lymphatic: Aortic atherosclerosis. No enlarged abdominal or pelvic lymph nodes. Reproductive: Moderate to marked prostate enlargement noted. Other: No ascites, focal collection or pneumoperitoneum. Musculoskeletal: No acute or suspicious bony abnormalities. Degenerative changes within the lumbar spine again noted. Wedge compression fracture of L1 is unchanged. IMPRESSION: 1. No evidence of lymphadenopathy, splenomegaly or metastatic disease. 2. No acute abnormalities. 3. Nonobstructing RIGHT renal calculi 4. Moderate to marked prostate enlargement 5. Coronary artery and Aortic Atherosclerosis (ICD10-I70.0). Electronically Signed   By: Margarette Canada M.D.   On: 05/29/2017 08:57   Dg Chest Port 1 View  Result Date: 06/03/2017 CLINICAL DATA:  Central line placement. EXAM: PORTABLE CHEST 1 VIEW COMPARISON:  January 29, 2004 FINDINGS: There is a new right central line terminating in the central SVC. No pneumothorax. The heart size borderline. The hila and mediastinum are unremarkable given technique. No overt edema or suspicious infiltrate. Mild atelectasis in the right base. IMPRESSION: The new right central line is in good position.  No pneumothorax. Electronically Signed   By: Dorise Bullion III M.D   On: 06/03/2017 12:11   Ct Head Code Stroke Wo Contrast  Result Date: 05/27/2017 CLINICAL DATA:  Code stroke. Altered level of consciousness. Aphasia EXAM: CT HEAD WITHOUT CONTRAST TECHNIQUE: Contiguous axial images were obtained from the base of the skull through the vertex without intravenous contrast. COMPARISON:  None. FINDINGS: Brain: Large area of high-density hemorrhage in the left frontal lobe with mixed densities. This measures 6.4 x 3.9 x 5.0 cm corresponding to 61 mL of blood. Surrounding edema. There is mass-effect and 4 mm midline shift to the  right. No subarachnoid or subdural hemorrhage Mild atrophy.  No other areas of hemorrhage or acute infarct Vascular: Normal arterial density. Skull: Negative Sinuses/Orbits: Paranasal sinuses show mild mucosal edema. Right cataract surgery  Other: None ASPECTS ([place] Stroke Program Early CT Score) - Ganglionic level infarction (caudate, lentiform nuclei, internal capsule, insula, M1-M3 cortex): 7 - Supraganglionic infarction (M4-M6 cortex): 3 Total score (0-10 with 10 being normal): 10 IMPRESSION: 1. Large hemorrhage in the left frontal lobe, 61 mL. Differential diagnostic considerations include amyloid, hypertension, vascular malformation. Hemorrhagic stroke not considered likely with acute onset of symptoms. 2. ASPECTS is 10 3. These results were called by telephone at the time of interpretation on 06/04/2017 at 6:16 pm to Dr. Rory Percy , who verbally acknowledged these results. Electronically Signed   By: Franchot Gallo M.D.   On: 05/21/2017 18:17   TTE pending   PHYSICAL EXAM  Temp:  [98.4 F (36.9 C)-101.2 F (38.4 C)] 101.2 F (38.4 C) (05/21 0700) Pulse Rate:  [78-98] 93 (05/21 1000) Resp:  [10-28] 25 (05/21 1000) BP: (122-153)/(51-90) 133/90 (05/21 1000) SpO2:  [90 %-100 %] 91 % (05/21 1000)  General - Well nourished, well developed, sleepy.  Ophthalmologic - fundi not visualized due to noncooperation.  Cardiovascular - Regular rate and rhythm.  Neuro - sleepy but easily arousble and able to maintain eyes open. Global aphasia, not following commands. Left pupil round 2.28mm, right surgical pupil dilated with irregular sharp, left gaze preference, barely cross midline. Inconsistently blinking to visual threat bilaterally. Right facial droop and tongue midline in mouth. LUE spontaneous movement against gravity able to hold up on exam. BLE withdraw 2+/5 on pain stimulation. RUE 1/5 withdraw on pain. DTR 1+ and right babinski positive. Sensation, coordination and gait not  tested.   ASSESSMENT/PLAN Jason Callahan is a 77 y.o. 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 No tPA given due to [place].    ICH:  left frontal large ICH likely secondary to HTN  Resultant aphasia, right UE plegia  CT head 05/30/2017 left frontal large ICH  MRI left frontal large ICH, no CAA or tumor  CT repeat 06/03/17 showed slight increased hematoma  NSG on board, no surgery for now  Repeat CT 06/04/17 smaller hematoma stable midline shift  CTA head and neck - no AVM or aneurysm, left ICA occlusion and right ICA patent s/p CEA  2D Echo  pending  LDL 42  HgbA1c 5.3  SCDs for VTE prophylaxis  aspirin 81 mg daily prior to admission, now on No antithrombotic.   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  Leukocytosis following with oncology  WBC 18.3->19.5  Pan CT negative so far  Continue outpt follow up with oncology  Fever  Spike 101.2  Check BCx  CXR pending  UA repeat pending  Hypertension Stable  BP goal < 160  On cleviprex, maxed out  Add losartan 100mg  daily, amlodipine 10 and labetalol 100 tid  Close monitoring  Hyperlipidemia  Home meds:  crestor   LDL 42, goal < 70  Hold off statin for now due to acute ICH  Other Stroke Risk Factors  Advanced age  PVD  Obstructive sleep apnea, on CPAP at home  Other Active Problems  hyperglycemia  Hospital day # 2  This patient is critically ill due to large right ICH, cerebral edema, HTN, carotid occlusion and at significant risk of  neurological worsening, death form cerebral edema, brain herniation, heart failure, aspiration pneumonia. This patient's care requires constant monitoring of vital signs, hemodynamics, respiratory and cardiac monitoring, review of multiple databases, neurological assessment, discussion with family, other specialists and medical decision making of high complexity. I spent 40 minutes of neurocritical care time in the care of this patient. I had long discussion with Jason Callahan and Jason Callahan at bedside, updated pt current condition, treatment plan and potential prognosis. They expressed understanding and appreciation.    Rosalin Hawking, MD PhD Stroke Neurology 06/04/2017 10:41 AM    To contact Stroke Continuity provider, please refer to [email or web address] After hours, contact General Neurology

## 2017-06-04 NOTE — Progress Notes (Signed)
Patients wife brought home CPAP. RT inspected equipment. No frayed wires. CPAP appears to be in good working condition. RT set up CPAP and placed on patient with 2L O2 bled into circuit. Patient is tolerating well at this time. RT will monitor as needed.

## 2017-06-04 NOTE — Progress Notes (Signed)
  Speech Language Pathology Treatment: Dysphagia;Cognitive-Linquistic  Patient Details Name: Jason Callahan MRN: [id number] DOB: 10/10/1940 Today's Date: 06/04/2017 Time: 5038-8828 SLP Time Calculation (min) (ACUTE ONLY): 18 min  Assessment / Plan / Recommendation Clinical Impression  Pt seen with am meds. He has been more lethargic this am, resting after poor sleep last night. The pt was repositioned and accepted a cup with a straw from RN. Initial sip resulted in immediate coughing. With tactile cues for a slower rate, small sips pt took further straw and cup sips without a cough. He did seem more successful with pills whole in puree though he did masticate one. Oral transit was slow. Though pt is tolerating PO and reportedly consumed dinner last night without difficulty, lethargy exacerbates suspected mild delay in swallow initiation. Additional assist and cueing are helpful.  Pt unable to follow verbal only commands today. Allows hand over hand assist and responds to tactile cues, but comprehension worsened from yesterday. Demonstrated pts automatic response to hand squeezing to family, who were using this as a Y/N response from pt. Explainned waxing and waning function due to lethargy. Will try to f/u during/after a PT or OT session to maximize response with adequate arousal.   HPI HPI: 77 year old gentleman who was admitted with sudden onset of aphasia and right hemiplegia. CT scan showed a left frontal hemorrhage.       SLP Plan  Continue with current plan of care       Recommendations  Diet recommendations: Dysphagia 3 (mechanical soft);Thin liquid Liquids provided via: Cup Medication Administration: Whole meds with puree Supervision: Staff to assist with self feeding Compensations: Slow rate                Oral Care Recommendations: Oral care BID Follow up Recommendations: Inpatient Rehab SLP Visit Diagnosis: Aphasia (R47.01);Apraxia (R48.2) Plan: Continue with current plan  of care       GO               [facility], [place] CCC-SLP [phone]  Lynann Beaver 06/04/2017, 11:42 AM

## 2017-06-04 NOTE — Progress Notes (Signed)
Inpatient Rehabilitation  Per OT & SLP request, patient was screened by Gunnar Fusi for appropriateness for an Inpatient Acute Rehab consult.  At this time we agree with OT and plan to follow along for activity tolerance prior to requesting an Inpatient Rehab consult.  Call if questions.   Carmelia Roller., CCC/SLP Admission Coordinator  [place]  Cell [phone]

## 2017-06-04 NOTE — Progress Notes (Signed)
Pts last Sodium check resulted 153, rising from 147 in the last 6+ hrs.  Dr. Rory Percy, neurology, on call notified.  Verbal orders given to decrease 3% to 25/hr, and recheck at q6 intervel.  Will continue to monitor.

## 2017-06-04 NOTE — Progress Notes (Signed)
MD notified of sodium 156.  Verbal order to keep 3% rate at 50 and start normal saline at 25.  Will continue to monitor.

## 2017-06-04 NOTE — Evaluation (Signed)
Occupational Therapy Evaluation Patient Details Name: Jason Callahan MRN: [id number] DOB: 12/15/1940 Today's Date: 06/04/2017    History of Present Illness This 77 y.o. male admitted with acute onset Rt sided weakness, Rt facial droop, and aphasia.   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  PMH includes:  PVD, LVH, Gout. s/p CEA.     Clinical Impression   Pt admitted with above. He demonstrates the below listed deficits and will benefit from continued OT to maximize safety and independence with BADLs.  Pt presents to OT with Rt hemiplegia, communication deficits, impaired balance (demonstrates pusher syndrome).   He was limited this date by lethargy and required total A for ADLs as well as max A +2 for functional mobility.  He lives with his wife and was fully independent PTA. Feel he will benefit from CIR if activity tolerance improves       Follow Up Recommendations  CIR;Supervision/Assistance - 24 hour    Equipment Recommendations  None recommended by OT    Recommendations for Other Services Rehab consult     Precautions / Restrictions Precautions Precautions: Fall      Mobility Bed Mobility Overal bed mobility: Needs Assistance Bed Mobility: Supine to Sit     Supine to sit: +2 for physical assistance;Total assist     General bed mobility comments: Pt required assist with all aspects of bed mobility due to lethargy    Transfers Overall transfer level: Needs assistance   Transfers: Sit to/from Stand;Stand Pivot Transfers Sit to Stand: Mod assist;+2 physical assistance Stand pivot transfers: Max assist;+2 safety/equipment       General transfer comment: pt required facilitation for anterior translation of trunk as well as facilitation at ischium and trunk for extension     Balance Overall balance assessment: Needs assistance Sitting-balance support: Feet supported Sitting balance-Leahy Scale: Poor Sitting balance -  Comments: requires mod - max a to maintain EOB sittng.  Pt pushes with Rt UE  Postural control: Right lateral lean Standing balance support: No upper extremity supported Standing balance-Leahy Scale: Poor Standing balance comment: requires mod A +2 to maintain static standing.  passively leans to the Rt                            ADL either performed or assessed with clinical judgement   ADL Overall ADL's : Needs assistance/impaired Eating/Feeding: NPO   Grooming: Wash/dry hands;Wash/dry face;Oral care;Total assistance;Sitting   Upper Body Bathing: Total assistance;Bed level   Lower Body Bathing: Total assistance;Bed level   Upper Body Dressing : Total assistance;Bed level   Lower Body Dressing: Total assistance;Bed level   Toilet Transfer: Maximal assistance;+2 for safety/equipment;Stand-pivot   Toileting- Clothing Manipulation and Hygiene: Total assistance;Sit to/from stand       Functional mobility during ADLs: Maximal assistance;+2 for safety/equipment;+2 for physical assistance General ADL Comments: Pt lethargic this date limiting his abiity to participate      Vision 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  No active movement noted  RUE Coordination: decreased fine motor;decreased gross motor   Lower Extremity Assessment Lower Extremity Assessment: Defer to PT evaluation   Cervical / Trunk Assessment Cervical / Trunk Assessment: Other exceptions Cervical / Trunk Exceptions: Decreased activation of  musculature on the Rt    Communication Communication Communication: Expressive difficulties;Receptive difficulties   Cognition Arousal/Alertness: Lethargic Behavior During Therapy: Flat affect Overall Cognitive Status: Impaired/Different from baseline Area of Impairment: Attention                   Current Attention Level: Focused           General Comments: Pt did not follow any commands.  Kept eyes closed majority of the time    General Comments  VSS     Exercises     Shoulder Instructions      Home Living Family/patient expects to be discharged to:: Private residence Living Arrangements: Spouse/significant other Available Help at Discharge: Family Type of Home: House Home Access: Stairs to enter [employer] of Steps: 2 Entrance Stairs-Rails: None Home Layout: Two level;Able to live on main level with bedroom/bathroom               Home Equipment: None      Lives With: Spouse    Prior Functioning/Environment Level of Independence: Independent                 OT Problem List: Decreased strength;Decreased range of motion;Decreased activity tolerance;Impaired balance (sitting and/or standing);Impaired vision/perception;Decreased coordination;Decreased cognition;Decreased safety awareness;Decreased knowledge of use of DME or AE;Impaired sensation;Impaired tone;Impaired UE functional use      OT Treatment/Interventions: Self-care/ADL training;Neuromuscular education;DME and/or AE instruction;Therapeutic activities;Cognitive remediation/compensation;Visual/perceptual remediation/compensation;Patient/family education;Balance training;Splinting    OT Goals(Current goals can be found in the care plan section) Acute Rehab OT Goals Patient Stated Goal: for pt to regain as much function as possible  OT Goal Formulation: With family Time For Goal Achievement: 06/18/17 Potential to Achieve Goals: Good  OT Frequency: Min 2X/week   Barriers to  D/C:            Co-evaluation              AM-PAC PT "6 Clicks" Daily Activity     Outcome Measure Help from another person eating meals?: Total Help from another person taking care of personal grooming?: Total Help from another person toileting, which includes using toliet, bedpan, or urinal?: A Lot Help from another person bathing (including washing, rinsing, drying)?: Total Help from another person to put on and taking off regular upper body clothing?: Total Help from another person to put on and taking off regular lower body clothing?: Total 6 Click Score: 7   End of Session Nurse Communication: Mobility status;Need for lift equipment  Activity Tolerance: Patient limited by lethargy Patient left: in chair;with call bell/phone within reach;with chair alarm set;with family/visitor present  OT Visit Diagnosis: Hemiplegia and hemiparesis;Cognitive communication deficit (R41.841) Symptoms and signs involving cognitive functions: Nontraumatic intracerebral hemorrhage Hemiplegia - Right/Left: Right Hemiplegia - dominant/non-dominant: Dominant Hemiplegia - caused by: Nontraumatic intracerebral hemorrhage                Time: 1122-1200 OT Time Calculation (min): 38 min Charges:  OT General Charges $OT Visit: 1 Visit OT Evaluation $OT Eval Moderate Complexity: 1 Mod G-Codes:     [facility], OTR/L [phone]   Lucille Passy M 06/04/2017, 2:49 PM

## 2017-06-05 ENCOUNTER — Inpatient Hospital Stay (HOSPITAL_COMMUNITY): Payer: PPO

## 2017-06-05 LAB — CBC WITH DIFFERENTIAL/PLATELET
ABS IMMATURE GRANULOCYTES: 0.6 10*3/uL — AB (ref 0.0–0.1)
BASOS ABS: 0.1 10*3/uL (ref 0.0–0.1)
BASOS PCT: 0 %
Eosinophils Absolute: 0 10*3/uL (ref 0.0–0.7)
Eosinophils Relative: 0 %
HCT: 45.7 % (ref 39.0–52.0)
Hemoglobin: 14.9 g/dL (ref 13.0–17.0)
Immature Granulocytes: 3 %
Lymphocytes Relative: 21 %
Lymphs Abs: 3.8 10*3/uL (ref 0.7–4.0)
MCH: 32.6 pg (ref 26.0–34.0)
MCHC: 32.6 g/dL (ref 30.0–36.0)
MCV: 100 fL (ref 78.0–100.0)
MONO ABS: 1.4 10*3/uL — AB (ref 0.1–1.0)
MONOS PCT: 7 %
NEUTROS ABS: 12.9 10*3/uL — AB (ref 1.7–7.7)
Neutrophils Relative %: 69 %
PLATELETS: 183 10*3/uL (ref 150–400)
RBC: 4.57 MIL/uL (ref 4.22–5.81)
RDW: 13.6 % (ref 11.5–15.5)
WBC: 18.8 10*3/uL — ABNORMAL HIGH (ref 4.0–10.5)

## 2017-06-05 LAB — PHOSPHORUS
PHOSPHORUS: 2.6 mg/dL (ref 2.5–4.6)
PHOSPHORUS: 1.7 mg/dL — AB (ref 2.5–4.6)

## 2017-06-05 LAB — BLOOD GAS, ARTERIAL
Acid-Base Excess: 4.5 mmol/L — ABNORMAL HIGH (ref 0.0–2.0)
Bicarbonate: 28.2 mmol/L — ABNORMAL HIGH (ref 20.0–28.0)
Drawn by: 51155
FIO2: 28
O2 CONTENT: 2 L/min
O2 SAT: 91.8 %
PCO2 ART: 45.4 mmHg (ref 32.0–48.0)
Patient temperature: 103
pH, Arterial: 7.423 (ref 7.350–7.450)
pO2, Arterial: 69.2 mmHg — ABNORMAL LOW (ref 83.0–108.0)

## 2017-06-05 LAB — BASIC METABOLIC PANEL
Anion gap: 10 (ref 5–15)
BUN: 19 mg/dL (ref 6–20)
CALCIUM: 9 mg/dL (ref 8.9–10.3)
CO2: 28 mmol/L (ref 22–32)
CREATININE: 1.19 mg/dL (ref 0.61–1.24)
Chloride: 120 mmol/L — ABNORMAL HIGH (ref 101–111)
GFR calc Af Amer: >60 mL/min (ref >60)
GFR, EST NON AFRICAN AMERICAN: 57 mL/min — AB (ref >60)
GLUCOSE: 148 mg/dL — AB (ref 65–99)
POTASSIUM: 3.2 mmol/L — AB (ref 3.5–5.1)
Sodium: 158 mmol/L — ABNORMAL HIGH (ref 135–145)

## 2017-06-05 LAB — SODIUM
SODIUM: 155 mmol/L — AB (ref 135–145)
SODIUM: 157 mmol/L — AB (ref 135–145)
Sodium: 158 mmol/L — ABNORMAL HIGH (ref 135–145)

## 2017-06-05 LAB — GLUCOSE, CAPILLARY
GLUCOSE-CAPILLARY: 148 mg/dL — AB (ref 65–99)
GLUCOSE-CAPILLARY: 149 mg/dL — AB (ref 65–99)
Glucose-Capillary: 148 mg/dL — ABNORMAL HIGH (ref 65–99)

## 2017-06-05 LAB — MAGNESIUM
Magnesium: 2.3 mg/dL (ref 1.7–2.4)
Magnesium: 2.3 mg/dL (ref 1.7–2.4)

## 2017-06-05 MED ORDER — PRO-STAT SUGAR FREE PO LIQD
30.0000 mL | Freq: Every day | ORAL | Status: DC
  Administered 2017-06-05 – 2017-06-07 (×11): 30 mL
  Filled 2017-06-05 (×10): qty 30

## 2017-06-05 MED ORDER — PRO-STAT SUGAR FREE PO LIQD: 30.0000 mL | Freq: Two times a day (BID) | ORAL | Status: DC

## 2017-06-05 MED ORDER — IPRATROPIUM-ALBUTEROL 0.5-2.5 (3) MG/3ML IN SOLN
3.0000 mL | Freq: Once | RESPIRATORY_TRACT | Status: AC
  Administered 2017-06-05: 3 mL via RESPIRATORY_TRACT
  Filled 2017-06-05: qty 3

## 2017-06-05 MED ORDER — POTASSIUM CHLORIDE 10 MEQ/50ML IV SOLN
10.0000 meq | INTRAVENOUS | Status: AC
  Administered 2017-06-05 (×6): 10 meq via INTRAVENOUS
  Filled 2017-06-05 (×6): qty 50

## 2017-06-05 MED ORDER — OSMOLITE 1.2 CAL PO LIQD
1000.0000 mL | ORAL | Status: DC
  Filled 2017-06-05 (×2): qty 1000

## 2017-06-05 MED ORDER — METOPROLOL TARTRATE 50 MG PO TABS
50.0000 mg | ORAL_TABLET | Freq: Two times a day (BID) | ORAL | Status: DC
  Administered 2017-06-05 – 2017-06-08 (×7): 50 mg via ORAL
  Filled 2017-06-05 (×7): qty 1

## 2017-06-05 MED ORDER — JEVITY 1.2 CAL PO LIQD
1000.0000 mL | ORAL | Status: DC
  Administered 2017-06-05 – 2017-06-07 (×3): 1000 mL
  Filled 2017-06-05 (×5): qty 1000

## 2017-06-05 MED ORDER — VITAL HIGH PROTEIN PO LIQD: 1000.0000 mL | ORAL | Status: DC

## 2017-06-05 NOTE — Progress Notes (Signed)
Cortrak Tube Team Note:  Consult received to place a Cortrak feeding tube.   A 10 F Cortrak tube was placed in the left nare and secured with a nasal bridle at 90 cm. Per the Cortrak monitor reading the tube tip is post pyloric.   X-ray is required, abdominal x-ray has been ordered by the Cortrak team. Please confirm tube placement before using the Cortrak tube.   If the tube becomes dislodged please keep the tube and contact the Cortrak team at [email or web address] (password [email or web address]) for replacement.  If after hours and replacement cannot be delayed, place a NG tube and confirm placement with an abdominal x-ray.    Koleen Distance MS, RD, LDN Pager #- [phone] Office#- [phone] After Hours Pager: [phone]

## 2017-06-05 NOTE — Progress Notes (Signed)
STROKE TEAM PROGRESS NOTE   SUBJECTIVE (INTERVAL HISTORY) His wife and son are at the bedside. Had again spiking fever 101.4. However, UA and CXR yesterday both negative. Blood culture pending NGTD. Wife said he had good night last night and slept well. Son said he moved his right arm against gravity and moved finger last night but not seen today. He denied seizure activity. Pt today lethargic but open eyes on voice, still global aphasia. Tachycardia at 110s. Poor appetite though. Na 158   OBJECTIVE Temp:  [98.7 F (37.1 C)-101.4 F (38.6 C)] 101.4 F (38.6 C) (05/22 0800) Pulse Rate:  [72-121] 110 (05/22 0800) Cardiac Rhythm: Normal sinus rhythm (05/22 0800) Resp:  [11-27] 24 (05/22 0800) BP: (111-180)/(49-86) 165/83 (05/22 0800) SpO2:  [90 %-100 %] 99 % (05/22 0800)  Recent Labs  Lab 05/30/2017 1835  GLUCAP 143*   Recent Labs  Lab 05/16/2017 1755 06/09/2017 1759  06/03/17 0722  06/04/17 0430 06/04/17 1128 06/04/17 1700 06/04/17 2331 06/05/17 0546  NA 142 142   < > 144   < > 153* 156* 154* 155* 158*  K 4.0 3.8  --  3.7  --  3.6  --   --   --  3.2*  CL 107 105  --  105  --  120*  --   --   --  120*  CO2 25  --   --  28  --  28  --   --   --  28  GLUCOSE 136* 132*  --  176*  --  174*  --   --   --  148*  BUN 20 21*  --  19  --  18  --   --   --  19  CREATININE 1.25* 1.10  --  1.09  --  1.15  --   --   --  1.19  CALCIUM 9.5  --   --  9.3  --  8.8*  --   --   --  9.0   < > = values in this interval not displayed.   Recent Labs  Lab 06/01/2017 1755  AST 25  ALT 26  ALKPHOS 75  BILITOT 1.2  PROT 6.5  ALBUMIN 4.2   Recent Labs  Lab 06/10/2017 1755 06/10/2017 1759 06/03/17 0722 06/04/17 0430 06/05/17 0546  WBC 12.9*  --  18.3* 19.5* 18.8*  NEUTROABS 7.0  --   --   --  12.9*  HGB 14.7 14.3 14.4 13.4 14.9  HCT 42.9 42.0 42.1 40.7 45.7  MCV 94.3  --  96.6 98.5 100.0  PLT 172  --  190 172 183   No results for input(s): CKTOTAL, CKMB, CKMBINDEX, TROPONINI in the last 168  hours. Recent Labs    06/03/2017 1755  LABPROT 13.5  INR 1.04   Recent Labs    06/04/17 1200  COLORURINE YELLOW  LABSPEC 1.013  PHURINE 5.0  GLUCOSEU NEGATIVE  HGBUR NEGATIVE  BILIRUBINUR NEGATIVE  KETONESUR NEGATIVE  PROTEINUR NEGATIVE  NITRITE NEGATIVE  LEUKOCYTESUR NEGATIVE       Component Value Date/Time   CHOL 111 06/03/2017 0722   TRIG 117 06/03/2017 0722   HDL 46 06/03/2017 0722   CHOLHDL 2.4 06/03/2017 0722   VLDL 23 06/03/2017 0722   LDLCALC 42 06/03/2017 0722   Lab Results  Component Value Date   HGBA1C 5.3 05/23/2017   No results found for: LABOPIA, COCAINSCRNUR, LABBENZ, AMPHETMU, THCU, LABBARB  No results for input(s): ETH in the  last 168 hours.  I have personally reviewed the radiological images below and agree with the radiology interpretations.  Ct Angio Head W Or Wo Contrast  Result Date: 06/07/2017 CLINICAL DATA:  Intracranial hemorrhage EXAM: CT ANGIOGRAPHY HEAD AND NECK TECHNIQUE: Multidetector CT imaging of the head and neck was performed using the standard protocol during bolus administration of intravenous contrast. Multiplanar CT image reconstructions and MIPs were obtained to evaluate the vascular anatomy. Carotid stenosis measurements (when applicable) are obtained utilizing NASCET criteria, using the distal internal carotid diameter as the denominator. CONTRAST:  79mL ISOVUE-370 IOPAMIDOL (ISOVUE-370) INJECTION 76% COMPARISON:  CT head immediately preceding FINDINGS: CTA NECK FINDINGS Aortic arch: Mild atherosclerotic disease aortic arch. Proximal great vessels widely patent Right carotid system: Right common carotid artery widely patent. Right carotid artery endarterectomy widely patent Left carotid system: Left common carotid artery widely patent. Mild atherosclerotic disease. 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 Left external carotid artery widely patent. Vertebral arteries: Both  vertebral arteries are widely patent without stenosis. Skeleton: No acute abnormality. Cervical spine degenerative changes. Other neck: Negative for mass or adenopathy. Upper chest: Negative Review of the MIP images confirms the above findings CTA HEAD FINDINGS Anterior circulation: Atherosclerotic calcification right cavernous carotid with mild stenosis. Right anterior and middle cerebral arteries patent without significant stenosis. 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 Left anterior and middle cerebral arteries patent without stenosis. Posterior circulation: Both vertebral arteries patent to the basilar. Basilar widely patent. PICA, superior cerebellar, posterior cerebral arteries patent without stenosis. Venous sinuses: Patent Anatomic variants: None Delayed phase: Large hematoma left frontal lobe unchanged from preceding CT. Local mass-effect and 4 mm midline shift to the right is unchanged. No enhancing mass lesion identified. Review of the MIP images confirms the above findings IMPRESSION: 1. Large left frontal hematoma. No evidence of underlying vascular malformation or tumor 2. Right carotid endarterectomy widely patent 3. 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 Left cavernous carotid is patent but severely stenotic. Electronically Signed   By: Franchot Gallo M.D.   On: 05/30/2017 18:51   Ct Head Wo Contrast  Result Date: 06/04/2017 CLINICAL DATA:  Intracranial hemorrhage follow-up EXAM: CT HEAD WITHOUT CONTRAST TECHNIQUE: Contiguous axial images were obtained from the base of the skull through the vertex without intravenous contrast. COMPARISON:  06/03/2017 FINDINGS: Brain: Large area of hemorrhage in the left frontal lobe is slightly smaller, likely due to decompression into the ventricles. Small amount of intraventricular  hemorrhage is now noted. Subarachnoid hemorrhage on the left is unchanged in volume. No new area of hemorrhage. There is mass-effect and 6.5 mm midline shift to the right. Negative for hydrocephalus. No other acute findings Vascular: Negative for hyperdense vessel Skull: Negative Sinuses/Orbits: Paranasal sinuses clear. Prior ocular surgery on the right. Other: None IMPRESSION: Large intraparenchymal hemorrhage in the left frontal lobe slightly smaller, likely due to decompression the ventricles. Small amount of ventricular hemorrhage noted. Subarachnoid hemorrhage on the left is unchanged 6.5 mm midline shift towards the right is unchanged. Electronically Signed   By: Franchot Gallo M.D.   On: 06/04/2017 09:29   Ct Head Wo Contrast  Result Date: 06/03/2017 CLINICAL DATA:  Cerebral hemorrhage. EXAM: CT HEAD WITHOUT CONTRAST TECHNIQUE: Contiguous axial images were obtained from the base of the skull through the vertex without intravenous contrast. COMPARISON:  CT head without contrast 05/26/2017. CTA head and neck 06/04/2017. FINDINGS: Brain: Left frontal parenchymal hemorrhage has increased in size, now measuring 9.3 x 5.1 x 4.6 cm. Previous measurements were 7.0 x 4.7 x 3.8 cm in the same planes. There is increased mass effect on the left lateral ventricle. No intraventricular hemorrhage is evident. Surrounding edema is noted. Maximal midline shift is 12 mm, increased from 6 mm on the prior exam. There is no hydrocephalus. No downward herniation is present. Basal cisterns are intact. The brainstem and cerebellum are normal. No new cortical infarcts are present. No separate hemorrhage is evident. Vascular: No hyperdense vessel or unexpected calcification. Skull: Calvarium is intact. No focal lytic or blastic lesions are present. No significant extracranial soft tissue injury is present. Sinuses/Orbits: Right scleral banding is again noted. Right lens replacement is noted. Orbits are otherwise unremarkable. The  paranasal sinuses and mastoid air cells are clear. IMPRESSION: 1. Expanding left frontal hematoma with increased mass effect as described. 2. Increasing mass effect on the left lateral ventricle without intraventricular hemorrhage or downward herniation. Critical Value/emergent results were called by telephone at the time of interpretation on 06/03/2017 at 7:32 am to Dr. Lorraine Lax , who verbally acknowledged these results. Electronically Signed   By: San Morelle M.D.   On: 06/03/2017 07:32   Ct Soft Tissue Neck W Contrast  Result Date: 05/28/2017 CLINICAL DATA:  Monoclonal lymphocytosis.  Rule out adenopathy EXAM: CT NECK WITH CONTRAST TECHNIQUE: Multidetector CT imaging of the neck was performed using the standard protocol following the bolus administration of intravenous contrast. CONTRAST:  160mL ISOVUE-300 IOPAMIDOL (ISOVUE-300) INJECTION 61% COMPARISON:  None. FINDINGS: Pharynx and larynx: Normal. No mass or swelling. Salivary glands: No inflammation, mass, or stone. Thyroid: Negative Lymph nodes: No pathologic or enlarged lymph nodes in the neck. Vascular: Right carotid  endarterectomy is patent. Occluded left internal carotid artery at the origin. Jugular vein patent bilaterally. Limited intracranial: Negative Visualized orbits: Right ocular surgery.  No orbital mass. Mastoids and visualized paranasal sinuses: Negative Skeleton: Degenerative changes in the cervical spine. No acute skeletal lesion. Upper chest: Chest CT from today reported separately Other: None IMPRESSION: Negative for mass or adenopathy in the neck Occluded left internal carotid artery. Right carotid endarterectomy is patent. Electronically Signed   By: Franchot Gallo M.D.   On: 05/28/2017 16:29   Ct Angio Neck W Or Wo Contrast  Result Date: 05/15/2017 CLINICAL DATA:  Intracranial hemorrhage EXAM: CT ANGIOGRAPHY HEAD AND NECK TECHNIQUE: Multidetector CT imaging of the head and neck was performed using the standard protocol during  bolus administration of intravenous contrast. Multiplanar CT image reconstructions and MIPs were obtained to evaluate the vascular anatomy. Carotid stenosis measurements (when applicable) are obtained utilizing NASCET criteria, using the distal internal carotid diameter as the denominator. CONTRAST:  54mL ISOVUE-370 IOPAMIDOL (ISOVUE-370) INJECTION 76% COMPARISON:  CT head immediately preceding FINDINGS: CTA NECK FINDINGS Aortic arch: Mild atherosclerotic disease aortic arch. Proximal great vessels widely patent Right carotid system: Right common carotid artery widely patent. Right carotid artery endarterectomy widely patent Left carotid system: Left common carotid artery widely patent. Mild atherosclerotic disease. 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 Left external carotid artery widely patent. Vertebral arteries: Both vertebral arteries are widely patent without stenosis. Skeleton: No acute abnormality. Cervical spine degenerative changes. Other neck: Negative for mass or adenopathy. Upper chest: Negative Review of the MIP images confirms the above findings CTA HEAD FINDINGS Anterior circulation: Atherosclerotic calcification  right cavernous carotid with mild stenosis. Right anterior and middle cerebral arteries patent without significant stenosis. 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 Left anterior and middle cerebral arteries patent without stenosis. Posterior circulation: Both vertebral arteries patent to the basilar. Basilar widely patent. PICA, superior cerebellar, posterior cerebral arteries patent without stenosis. Venous sinuses: Patent Anatomic variants: None Delayed phase: Large hematoma left frontal lobe unchanged from preceding CT. Local mass-effect and 4 mm midline shift to the right is unchanged. No enhancing mass lesion  identified. Review of the MIP images confirms the above findings IMPRESSION: 1. Large left frontal hematoma. No evidence of underlying vascular malformation or tumor 2. Right carotid endarterectomy widely patent 3. 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 Left cavernous carotid is patent but severely stenotic. Electronically Signed   By: Franchot Gallo M.D.   On: 06/12/2017 18:51   Ct Chest W Contrast  Result Date: 05/29/2017 CLINICAL DATA:  77 year old male for initial staging of chronic lymphocytic leukemia. EXAM: CT CHEST, ABDOMEN, AND PELVIS WITH CONTRAST TECHNIQUE: Multidetector CT imaging of the chest, abdomen and pelvis was performed following the standard protocol during bolus administration of intravenous contrast. CONTRAST:  12mL ISOVUE-300 IOPAMIDOL (ISOVUE-300) INJECTION 61% COMPARISON:  01/28/2004 chest CT, 06/29/2009 abdominal and pelvic CT and prior studies FINDINGS: CT CHEST FINDINGS Cardiovascular: Heart size is normal. Multivessel coronary artery calcifications identified. Thoracic aortic atherosclerotic calcifications noted without aneurysm. No pericardial effusion. Mediastinum/Nodes: No enlarged mediastinal, hilar, or axillary lymph nodes. Thyroid gland, trachea, and esophagus demonstrate no significant findings. Lungs/Pleura: Minimal dependent atelectasis/interstitial prominence is unchanged. Minimal LEFT basilar scarring again noted. No airspace disease, consolidation, mass, nodule, pleural effusion or pneumothorax noted. Musculoskeletal: No chest wall mass or suspicious bone lesions identified. CT ABDOMEN PELVIS FINDINGS Hepatobiliary: The liver and gallbladder are unremarkable. No biliary dilatation. Pancreas: Unremarkable Spleen: Unremarkable Adrenals/Urinary Tract: Kidneys, adrenal glands and bladder are unremarkable except for nonobstructing RIGHT renal calculi. The largest RIGHT renal calculus measures 10 x 16 mm within the mid  RIGHT kidney. Stomach/Bowel: Stomach is within normal limits. No evidence of bowel wall thickening, distention, or inflammatory changes. Vascular/Lymphatic: Aortic atherosclerosis. No enlarged abdominal or pelvic lymph nodes. Reproductive: Moderate to marked prostate enlargement noted. Other: No ascites, focal collection or pneumoperitoneum. Musculoskeletal: No acute or suspicious bony abnormalities. Degenerative changes within the lumbar spine again noted. Wedge compression fracture of L1 is unchanged. IMPRESSION: 1. No evidence of lymphadenopathy, splenomegaly or metastatic disease. 2. No acute abnormalities. 3. Nonobstructing RIGHT renal calculi 4. Moderate to marked prostate enlargement 5. Coronary artery and Aortic Atherosclerosis (ICD10-I70.0). Electronically Signed   By: Margarette Canada M.D.   On: 05/29/2017 08:57   [name] BV Contrast  Result Date: 06/03/2017 CLINICAL DATA:  Cerebral hemorrhage. EXAM: MRI HEAD WITHOUT AND WITH CONTRAST TECHNIQUE: Multiplanar, multiecho pulse sequences of the brain and surrounding structures were obtained without and with intravenous contrast. CONTRAST:  76mL MULTIHANCE GADOBENATE DIMEGLUMINE 529 MG/ML IV SOLN COMPARISON:  Head CT 06/03/2017 at 0609 hours FINDINGS: Brain: 9.6 x 5.2 x 4.7 cm left frontal lobe hematoma has not significantly changed in size. There is mild surrounding edema with unchanged mass effect on the left lateral ventricle. 12 mm of rightward midline shift is unchanged. A small amount of adjacent left frontal subarachnoid hemorrhage is again seen. There is trace hemorrhage in the occipital horn of the right lateral ventricle.  No acute infarct is evident separate from the left frontal hematoma. No underlying enhancing mass lesion is evident. No chronic brain hemorrhages are identified. There is no extra-axial fluid collection. Mild cerebral atrophy is within normal limits for age. A few punctate foci of cerebral white matter T2 hyperintensity are also  within normal limits for age. Vascular: Major intracranial vascular flow voids are preserved. Skull and upper cervical spine: Unremarkable bone marrow signal. Sinuses/Orbits: Right cataract extraction and scleral banding. No significant sinus disease or significant mastoid fluid. Other: None. IMPRESSION: 1. Large left frontal lobe hematoma, unchanged from today's earlier CT. Unchanged mass effect with 12 mm of midline shift. No evidence of underlying mass. No chronic hemorrhages to suggest cerebral amyloid angiopathy. 2. Small volume adjacent subarachnoid hemorrhage and trace intraventricular hemorrhage. No evidence of hydrocephalus. Electronically Signed   By: Logan Bores M.D.   On: 06/03/2017 16:43   Ct Abdomen Pelvis W Contrast  Result Date: 05/29/2017 CLINICAL DATA:  77 year old male for initial staging of chronic lymphocytic leukemia. EXAM: CT CHEST, ABDOMEN, AND PELVIS WITH CONTRAST TECHNIQUE: Multidetector CT imaging of the chest, abdomen and pelvis was performed following the standard protocol during bolus administration of intravenous contrast. CONTRAST:  111mL ISOVUE-300 IOPAMIDOL (ISOVUE-300) INJECTION 61% COMPARISON:  01/28/2004 chest CT, 06/29/2009 abdominal and pelvic CT and prior studies FINDINGS: CT CHEST FINDINGS Cardiovascular: Heart size is normal. Multivessel coronary artery calcifications identified. Thoracic aortic atherosclerotic calcifications noted without aneurysm. No pericardial effusion. Mediastinum/Nodes: No enlarged mediastinal, hilar, or axillary lymph nodes. Thyroid gland, trachea, and esophagus demonstrate no significant findings. Lungs/Pleura: Minimal dependent atelectasis/interstitial prominence is unchanged. Minimal LEFT basilar scarring again noted. No airspace disease, consolidation, mass, nodule, pleural effusion or pneumothorax noted. Musculoskeletal: No chest wall mass or suspicious bone lesions identified. CT ABDOMEN PELVIS FINDINGS Hepatobiliary: The liver and  gallbladder are unremarkable. No biliary dilatation. Pancreas: Unremarkable Spleen: Unremarkable Adrenals/Urinary Tract: Kidneys, adrenal glands and bladder are unremarkable except for nonobstructing RIGHT renal calculi. The largest RIGHT renal calculus measures 10 x 16 mm within the mid RIGHT kidney. Stomach/Bowel: Stomach is within normal limits. No evidence of bowel wall thickening, distention, or inflammatory changes. Vascular/Lymphatic: Aortic atherosclerosis. No enlarged abdominal or pelvic lymph nodes. Reproductive: Moderate to marked prostate enlargement noted. Other: No ascites, focal collection or pneumoperitoneum. Musculoskeletal: No acute or suspicious bony abnormalities. Degenerative changes within the lumbar spine again noted. Wedge compression fracture of L1 is unchanged. IMPRESSION: 1. No evidence of lymphadenopathy, splenomegaly or metastatic disease. 2. No acute abnormalities. 3. Nonobstructing RIGHT renal calculi 4. Moderate to marked prostate enlargement 5. Coronary artery and Aortic Atherosclerosis (ICD10-I70.0). Electronically Signed   By: Margarette Canada M.D.   On: 05/29/2017 08:57   Dg Chest Port 1 View  Result Date: 06/03/2017 CLINICAL DATA:  Central line placement. EXAM: PORTABLE CHEST 1 VIEW COMPARISON:  January 29, 2004 FINDINGS: There is a new right central line terminating in the central SVC. No pneumothorax. The heart size borderline. The hila and mediastinum are unremarkable given technique. No overt edema or suspicious infiltrate. Mild atelectasis in the right base. IMPRESSION: The new right central line is in good position.  No pneumothorax. Electronically Signed   By: Dorise Bullion III M.D   On: 06/03/2017 12:11   Ct Head Code Stroke Wo Contrast  Result Date: 05/16/2017 CLINICAL DATA:  Code stroke. Altered level of consciousness. Aphasia EXAM: CT HEAD WITHOUT CONTRAST TECHNIQUE: Contiguous axial images were obtained from the base of the skull through the vertex without  intravenous contrast.  COMPARISON:  None. FINDINGS: Brain: Large area of high-density hemorrhage in the left frontal lobe with mixed densities. This measures 6.4 x 3.9 x 5.0 cm corresponding to 61 mL of blood. Surrounding edema. There is mass-effect and 4 mm midline shift to the right. No subarachnoid or subdural hemorrhage Mild atrophy.  No other areas of hemorrhage or acute infarct Vascular: Normal arterial density. Skull: Negative Sinuses/Orbits: Paranasal sinuses show mild mucosal edema. Right cataract surgery Other: None ASPECTS ([place] Stroke Program Early CT Score) - Ganglionic level infarction (caudate, lentiform nuclei, internal capsule, insula, M1-M3 cortex): 7 - Supraganglionic infarction (M4-M6 cortex): 3 Total score (0-10 with 10 being normal): 10 IMPRESSION: 1. Large hemorrhage in the left frontal lobe, 61 mL. Differential diagnostic considerations include amyloid, hypertension, vascular malformation. Hemorrhagic stroke not considered likely with acute onset of symptoms. 2. ASPECTS is 10 3. These results were called by telephone at the time of interpretation on 05/30/2017 at 6:16 pm to Dr. Rory Percy , who verbally acknowledged these results. Electronically Signed   By: Franchot Gallo M.D.   On: 06/09/2017 18:17   Dg Chest Port 1 View  Result Date: 06/04/2017 CLINICAL DATA:  Fever. EXAM: PORTABLE CHEST 1 VIEW COMPARISON:  Chest x-ray from yesterday. FINDINGS: Unchanged right subclavian central venous catheter. Stable cardiomediastinal silhouette. Stable mild pulmonary vascular congestion. Minimal bibasilar atelectasis. No focal consolidation, pleural effusion, or pneumothorax. No acute osseous abnormality. Old left-sided rib fractures. IMPRESSION: 1. Mild pulmonary vascular congestion without overt edema, unchanged. Electronically Signed   By: Titus Dubin M.D.   On: 06/04/2017 11:39   TTE - Left ventricle: The cavity size was normal. Wall thickness was   increased in a pattern of mild LVH.  Systolic function was   vigorous. The estimated ejection fraction was in the range of 65%   to 70%. Wall motion was normal; there were no regional wall   motion abnormalities. - Aortic valve: Trileaflet; mildly thickened, moderately calcified   leaflets. Moderate calcification involving the noncoronary cusp. - Pulmonary arteries: Systolic pressure was moderately increased.   PA peak pressure: 51 mm Hg (S).  PHYSICAL EXAM  Temp:  [98.7 F (37.1 C)-101.4 F (38.6 C)] 101.4 F (38.6 C) (05/22 0800) Pulse Rate:  [72-121] 110 (05/22 0800) Resp:  [11-27] 24 (05/22 0800) BP: (111-180)/(49-86) 165/83 (05/22 0800) SpO2:  [90 %-100 %] 99 % (05/22 0800)  General - Well nourished, well developed, sleepy.  Ophthalmologic - fundi not visualized due to noncooperation.  Cardiovascular - Regular rhythm but tachycardia.  Neuro - sleepy but easily arousble and able to maintain eyes open. Global aphasia, not following commands. Left pupil round 2.37mm, right surgical pupil dilated with irregular sharp, left gaze preference, not cross midline. Blinking to visual threat on the left but not on the right. Right facial droop and tongue midline in mouth. LUE spontaneous movement against gravity able to hold up on exam. BLE withdraw on pain stimulation, L>R. RUE 1/5 withdraw on pain. DTR 1+ and right babinski positive. Sensation, coordination and gait not tested.   ASSESSMENT/PLAN Jason Callahan is a 77 y.o. 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 No tPA given due to [place].    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  Leukocytosis following with oncology  WBC 18.3->19.5->18.8  Pan CT negative so far  Continue outpt follow up with oncology  Fever - likely related to blood absorption  Spike 101.2->101.4   BCx NGTD  CXR unremarkable  UA repeat neg  Continue monitor  Tylenol PRN  Hypertension and tachycardia Stable  BP goal < 160  On cleviprex  Add losartan 100mg  daily, amlodipine 10  Change labetalol to metoprolol for tachycardia  Close monitoring  Hyperlipidemia  Home meds:  crestor   LDL 42, goal < 70  Hold off statin for now due to acute ICH  Poor appetite  Passed swallow  Able take meds with apple sauce  Not much for meals  Consider cortrak if not eating well  Other Stroke Risk Factors  Advanced age  PVD  Obstructive sleep apnea, on CPAP at home  Other Active Problems  Hyperglycemia  Hypokalemia - K 3.2 - supplement  Hospital day # 3  This patient is critically ill due to large right ICH, cerebral edema, HTN, carotid occlusion, fever and at significant risk of neurological worsening, death form cerebral edema, brain herniation, heart failure, aspiration pneumonia. This patient's care requires constant monitoring of vital signs, hemodynamics, respiratory and cardiac monitoring, review of multiple databases, neurological assessment,  discussion with family, other specialists and medical decision making of high complexity. I spent 35 minutes of neurocritical care time in the care of this patient. I had long discussion with wife and son at bedside, updated pt current condition, treatment plan and potential prognosis. They expressed understanding and appreciation.    Rosalin Hawking, MD PhD Stroke Neurology 06/05/2017 10:07 AM    To contact Stroke Continuity provider, please refer to [email or web address] After hours, contact General Neurology

## 2017-06-05 NOTE — Progress Notes (Signed)
Physical Therapy Treatment Patient Details Name: Jason Callahan MRN: [id number] DOB: 1940-10-13 Today's Date: 06/05/2017    History of Present Illness This 77 y.o. male admitted with acute onset Rt sided weakness, Rt facial droop, and aphasia.   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  PMH includes:  PVD, LVH, Gout. s/p CEA.      PT Comments    Pt very lethargic today.  Eyes closed 90% of time, but arousal and acknowledgment of his wife and grand daughter.  Emphasis on transitions to EOB, sitting balance, awareness of right side happenings, sit to stand and finally transfer to the chair.    Follow Up Recommendations  CIR;Supervision/Assistance - 24 hour     Equipment Recommendations  Other (comment)(TBA)    Recommendations for Other Services Rehab consult     Precautions / Restrictions Precautions Precautions: Fall    Mobility  Bed Mobility Overal bed mobility: Needs Assistance Bed Mobility: Rolling;Sidelying to Sit Rolling: Max assist Sidelying to sit: +2 for physical assistance;Total assist       General bed mobility comments: Pt required assist with all aspects of bed mobility due to lethargy    Transfers     Transfers: Sit to/from Stand;Stand Pivot Transfers Sit to Stand: Max assist;+2 physical assistance Stand pivot transfers: Max assist;+2 safety/equipment       General transfer comment: pt required facilitation for anterior translation of trunk as well as facilitation at ischium and trunk for extension   Ambulation/Gait             General Gait Details: NT   Stairs             Wheelchair Mobility    Modified Rankin (Stroke Patients Only) Modified Rankin (Stroke Patients Only) Pre-Morbid Rankin Score: No symptoms Modified Rankin: Severe disability     Balance Overall balance assessment: Needs assistance Sitting-balance support: Feet supported Sitting balance-Leahy Scale: Poor Sitting  balance - Comments: mostly mod to max assist for balance, but perked up and sat upright with min assist for 5-10 seconds to interact with his grand daughter.   Standing balance support: No upper extremity supported Standing balance-Leahy Scale: Poor Standing balance comment: mod to max for static standing                            Cognition Arousal/Alertness: Lethargic Behavior During Therapy: Flat affect Overall Cognitive Status: Impaired/Different from baseline Area of Impairment: Attention                   Current Attention Level: Focused           General Comments: Did not follow commands, kept eyes closed 90% of time.  Pt's wife and grand daughter got the most attension from the pt.  Unable to draw pt's attension to the right.      Exercises      General Comments General comments (skin integrity, edema, etc.): vss       Pertinent Vitals/Pain Pain Assessment: Faces Faces Pain Scale: No hurt    Home Living                      Prior Function            PT Goals (current goals can now be found in the care plan section) Acute Rehab PT Goals Patient Stated Goal: for pt to regain as much function as  possible  PT Goal Formulation: With patient Time For Goal Achievement: 06/18/17 Potential to Achieve Goals: Good Progress towards PT goals: Progressing toward goals    Frequency    Min 4X/week      PT Plan Current plan remains appropriate    Co-evaluation              AM-PAC PT "6 Clicks" Daily Activity  Outcome Measure  Difficulty turning over in bed (including adjusting bedclothes, sheets and blankets)?: Unable Difficulty moving from lying on back to sitting on the side of the bed? : Unable Difficulty sitting down on and standing up from a chair with arms (e.g., wheelchair, bedside commode, etc,.)?: Unable Help needed moving to and from a bed to chair (including a wheelchair)?: A Lot Help needed walking in hospital  room?: Total Help needed climbing 3-5 steps with a railing? : Total 6 Click Score: 7    End of Session   Activity Tolerance: Patient tolerated treatment well Patient left: in chair;with call bell/phone within reach;with family/visitor present;with chair alarm set(lift pad underneath) Nurse Communication: Mobility status PT Visit Diagnosis: Unsteadiness on feet (R26.81);Other abnormalities of gait and mobility (R26.89);Hemiplegia and hemiparesis;Muscle weakness (generalized) (M62.81) Hemiplegia - Right/Left: Right Hemiplegia - dominant/non-dominant: Dominant Hemiplegia - caused by: Nontraumatic intracerebral hemorrhage     Time: 2595-6387 PT Time Calculation (min) (ACUTE ONLY): 30 min  Charges:  $Therapeutic Activity: 23-37 mins                    G Codes:       June 26, 2017  Donnella Sham, PT [phone]  (pager)   [name] Kaylianna Detert 26-Jun-2017, 3:22 PM

## 2017-06-05 NOTE — Progress Notes (Addendum)
Initial Nutrition Assessment  DOCUMENTATION CODES:   Not applicable  INTERVENTION:   Initiate Jevity 1.2 @ 25 ml/hr (600 ml/day) via Cortrak  30 ml Prostat five times per day  Provides: 1270 kcal, 108 grams protein, and 486 ml free water.  TF regimen and cleviprex at current rate providing 2372 total kcal/day    NUTRITION DIAGNOSIS:   Inadequate oral intake related to dysphagia as evidenced by NPO status.  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    Pt passed swallow eval but PO intake very poor, Cortrak today tip post pyloric per team, xray pending Pt now NPO Spoke with pt's family at bedside. They report that pt was very independent and mowing his grass PTA. Pt had lost a few pound but had been intentionally trying to lose weight. Pt was much more alert on admission and observed a video of pt eating a roll on family phone. Pt now does slightly open eyes to my voice but is unable to answer any questions.   Medications reviewed and include: senokot-s, KCl 10 mEq x 6 Labs reviewed: Na 158 (H), K+ 3.2 (L) Now off 3% Cleviprex @ 24 ml/hr - per RN hopeful to wean down today Provides: 1152 kcal  NUTRITION - FOCUSED PHYSICAL EXAM:    Most Recent Value  Orbital Region  No depletion  Upper Arm Region  No depletion  Thoracic and Lumbar Region  No depletion  Buccal Region  No depletion  Temple Region  No depletion  Clavicle Bone Region  No depletion  Clavicle and Acromion Bone Region  No depletion  Scapular Bone Region  No depletion  Dorsal Hand  No depletion  Patellar Region  No depletion  Anterior Thigh Region  Mild depletion  Posterior Calf Region  No depletion  Edema (RD Assessment)  None   Hair  Reviewed  Eyes  Reviewed  Mouth  Reviewed  Skin  Reviewed  Nails  Reviewed       Diet Order:   Diet Order           Diet NPO time specified  Diet effective now          EDUCATION NEEDS:   No education needs have been identified at this time  Skin:  Skin Assessment: Reviewed RN Assessment  Last BM:  5/19  Height:   Ht Readings from Last 1 Encounters:  05/21/2017 5\' 10"  (1.778 m)    Weight:   Wt Readings from Last 1 Encounters:  06/05/17 183 lb 6.8 oz (83.2 kg)    Ideal Body Weight:  75.4 kg  BMI:  Body mass index is 26.32 kg/m.  Estimated Nutritional Needs:   Kcal:  1950-2200  Protein:  100-115 grams  Fluid:  >2L/day  Maylon Peppers RD, LDN, CNSC [phone] Pager [phone] After Hours Pager

## 2017-06-05 NOTE — Progress Notes (Signed)
  Speech Language Pathology Treatment: Dysphagia  Patient Details Name: Jason Callahan MRN: [id number] DOB: 11/20/1940 Today's Date: 06/05/2017 Time: 1015-1030 SLP Time Calculation (min) (ACUTE ONLY): 15 min  Assessment / Plan / Recommendation Clinical Impression  Pt has been persistently lethargic over the past 24 hours, is on 3% saline. RN reports pt was orally holding meds crushed in puree and coughing with thin liquids, didn't feel comfortable giving further PO. SLP repositioned pt in chair, provided max stim to elicit only brief periods of arousal. Oral holding observed with yogurt. Pt is not alert enough for PO. Recommend NPO status with Cortrak placement until arousal improves. Discussed with family and MD.   HPI HPI: 77 year old gentleman who was admitted with sudden onset of aphasia and right hemiplegia. CT scan showed a left frontal hemorrhage.       SLP Plan  Continue with current plan of care       Recommendations  Diet recommendations: NPO                SLP Visit Diagnosis: Dysphagia, unspecified (R13.10) Plan: Continue with current plan of care       GO                Anavi Branscum, Katherene Ponto 06/05/2017, 10:43 AM

## 2017-06-05 NOTE — Progress Notes (Addendum)
Called regarding tachypnea, patient with some wheezing and decreased BS at the bases. Will get CXR and try duoneb. Could also be related to fever.   Roland Rack, MD [facility] [phone]  If 7pm- 7am, please page neurology on call as listed in [place].

## 2017-06-06 ENCOUNTER — Inpatient Hospital Stay (HOSPITAL_COMMUNITY): Payer: PPO

## 2017-06-06 DIAGNOSIS — R400 Somnolence: Secondary | ICD-10-CM

## 2017-06-06 DIAGNOSIS — R0602 Shortness of breath: Secondary | ICD-10-CM

## 2017-06-06 DIAGNOSIS — I611 Nontraumatic intracerebral hemorrhage in hemisphere, cortical: Principal | ICD-10-CM

## 2017-06-06 DIAGNOSIS — I639 Cerebral infarction, unspecified: Secondary | ICD-10-CM

## 2017-06-06 LAB — GLUCOSE, CAPILLARY
GLUCOSE-CAPILLARY: 151 mg/dL — AB (ref 65–99)
GLUCOSE-CAPILLARY: 176 mg/dL — AB (ref 65–99)
GLUCOSE-CAPILLARY: 195 mg/dL — AB (ref 65–99)
Glucose-Capillary: 141 mg/dL — ABNORMAL HIGH (ref 65–99)
Glucose-Capillary: 155 mg/dL — ABNORMAL HIGH (ref 65–99)
Glucose-Capillary: 132 mg/dL — ABNORMAL HIGH (ref 65–99)

## 2017-06-06 LAB — BASIC METABOLIC PANEL
ANION GAP: 9 (ref 5–15)
BUN: 35 mg/dL — ABNORMAL HIGH (ref 6–20)
CO2: 28 mmol/L (ref 22–32)
Calcium: 9.2 mg/dL (ref 8.9–10.3)
Chloride: 123 mmol/L — ABNORMAL HIGH (ref 101–111)
Creatinine, Ser: 1.27 mg/dL — ABNORMAL HIGH (ref 0.61–1.24)
GFR calc non Af Amer: 53 mL/min — ABNORMAL LOW (ref >60)
GLUCOSE: 164 mg/dL — AB (ref 65–99)
POTASSIUM: 3.2 mmol/L — AB (ref 3.5–5.1)
Sodium: 160 mmol/L — ABNORMAL HIGH (ref 135–145)

## 2017-06-06 LAB — CBC WITH DIFFERENTIAL/PLATELET
ABS IMMATURE GRANULOCYTES: 0.5 10*3/uL — AB (ref 0.0–0.1)
Basophils Absolute: 0.2 10*3/uL — ABNORMAL HIGH (ref 0.0–0.1)
Basophils Relative: 1 %
Eosinophils Absolute: 0.1 10*3/uL (ref 0.0–0.7)
Eosinophils Relative: 1 %
HCT: 48.3 % (ref 39.0–52.0)
HEMOGLOBIN: 15.6 g/dL (ref 13.0–17.0)
IMMATURE GRANULOCYTES: 3 %
LYMPHS PCT: 24 %
Lymphs Abs: 4.5 10*3/uL — ABNORMAL HIGH (ref 0.7–4.0)
MCH: 32.8 pg (ref 26.0–34.0)
MCHC: 32.3 g/dL (ref 30.0–36.0)
MCV: 101.7 fL — ABNORMAL HIGH (ref 78.0–100.0)
MONO ABS: 1.7 10*3/uL — AB (ref 0.1–1.0)
MONOS PCT: 9 %
NEUTROS ABS: 11.9 10*3/uL — AB (ref 1.7–7.7)
NEUTROS PCT: 62 %
PLATELETS: 163 10*3/uL (ref 150–400)
RBC: 4.75 MIL/uL (ref 4.22–5.81)
RDW: 13.3 % (ref 11.5–15.5)
WBC: 18.8 10*3/uL — ABNORMAL HIGH (ref 4.0–10.5)

## 2017-06-06 LAB — COMPREHENSIVE METABOLIC PANEL
ALBUMIN: 3.5 g/dL (ref 3.5–5.0)
ALK PHOS: 56 U/L (ref 38–126)
ALT: 20 U/L (ref 17–63)
AST: 31 U/L (ref 15–41)
Anion gap: 8 (ref 5–15)
BUN: 45 mg/dL — AB (ref 6–20)
CHLORIDE: 124 mmol/L — AB (ref 101–111)
CO2: 28 mmol/L (ref 22–32)
CREATININE: 1.4 mg/dL — AB (ref 0.61–1.24)
Calcium: 9 mg/dL (ref 8.9–10.3)
GFR calc Af Amer: 54 mL/min — ABNORMAL LOW (ref >60)
GFR calc non Af Amer: 47 mL/min — ABNORMAL LOW (ref >60)
GLUCOSE: 159 mg/dL — AB (ref 65–99)
Potassium: 3.9 mmol/L (ref 3.5–5.1)
SODIUM: 160 mmol/L — AB (ref 135–145)
Total Bilirubin: 1.5 mg/dL — ABNORMAL HIGH (ref 0.3–1.2)
Total Protein: 6.1 g/dL — ABNORMAL LOW (ref 6.5–8.1)

## 2017-06-06 LAB — LIPASE, BLOOD: Lipase: 22 U/L (ref 11–51)

## 2017-06-06 LAB — PHOSPHORUS
PHOSPHORUS: 2.8 mg/dL (ref 2.5–4.6)
Phosphorus: 3.2 mg/dL (ref 2.5–4.6)

## 2017-06-06 LAB — SODIUM
SODIUM: 154 mmol/L — AB (ref 135–145)
SODIUM: 157 mmol/L — AB (ref 135–145)
Sodium: 158 mmol/L — ABNORMAL HIGH (ref 135–145)
Sodium: 159 mmol/L — ABNORMAL HIGH (ref 135–145)

## 2017-06-06 LAB — LACTIC ACID, PLASMA
Lactic Acid, Venous: 1.3 mmol/L (ref 0.5–1.9)
Lactic Acid, Venous: 1.5 mmol/L (ref 0.5–1.9)

## 2017-06-06 LAB — AMYLASE: Amylase: 45 U/L (ref 28–100)

## 2017-06-06 LAB — PROCALCITONIN

## 2017-06-06 LAB — URIC ACID: Uric Acid, Serum: 4.8 mg/dL (ref 4.4–7.6)

## 2017-06-06 LAB — MAGNESIUM
Magnesium: 2.2 mg/dL (ref 1.7–2.4)
Magnesium: 2.3 mg/dL (ref 1.7–2.4)

## 2017-06-06 LAB — TRIGLYCERIDES: TRIGLYCERIDES: 493 mg/dL — AB (ref <150)

## 2017-06-06 MED ORDER — ACETAMINOPHEN 650 MG RE SUPP: 650.0000 mg | RECTAL | Status: DC

## 2017-06-06 MED ORDER — ACETYLCYSTEINE 20 % IN SOLN: 3.0000 mL | Freq: Four times a day (QID) | RESPIRATORY_TRACT | Status: DC

## 2017-06-06 MED ORDER — VANCOMYCIN HCL IN DEXTROSE 750-5 MG/150ML-% IV SOLN
750.0000 mg | Freq: Two times a day (BID) | INTRAVENOUS | Status: DC
  Administered 2017-06-07 – 2017-06-09 (×5): 750 mg via INTRAVENOUS
  Filled 2017-06-06 (×9): qty 150

## 2017-06-06 MED ORDER — ACETAMINOPHEN 160 MG/5ML PO SOLN
650.0000 mg | ORAL | Status: DC
  Administered 2017-06-06 – 2017-06-13 (×39): 650 mg
  Filled 2017-06-06 (×40): qty 20.3

## 2017-06-06 MED ORDER — HYDROCORTISONE NA SUCCINATE PF 100 MG IJ SOLR
50.0000 mg | Freq: Four times a day (QID) | INTRAMUSCULAR | Status: DC
  Administered 2017-06-06 – 2017-06-08 (×9): 50 mg via INTRAVENOUS
  Filled 2017-06-06 (×9): qty 2

## 2017-06-06 MED ORDER — ACETYLCYSTEINE 20 % IN SOLN
3.0000 mL | Freq: Four times a day (QID) | RESPIRATORY_TRACT | Status: DC
  Administered 2017-06-06 (×2): 3 mL via RESPIRATORY_TRACT
  Administered 2017-06-07 (×2): 1 mL via RESPIRATORY_TRACT
  Administered 2017-06-07 – 2017-06-08 (×3): 3 mL via RESPIRATORY_TRACT
  Filled 2017-06-06 (×7): qty 4

## 2017-06-06 MED ORDER — VANCOMYCIN HCL 10 G IV SOLR
1500.0000 mg | Freq: Once | INTRAVENOUS | Status: AC
  Administered 2017-06-06: 1500 mg via INTRAVENOUS
  Filled 2017-06-06: qty 1500

## 2017-06-06 MED ORDER — ACETAMINOPHEN 325 MG PO TABS: 650.0000 mg | ORAL_TABLET | ORAL | Status: DC

## 2017-06-06 MED ORDER — PIPERACILLIN-TAZOBACTAM 3.375 G IVPB 30 MIN
3.3750 g | Freq: Once | INTRAVENOUS | Status: AC
  Administered 2017-06-06: 3.375 g via INTRAVENOUS
  Filled 2017-06-06: qty 50

## 2017-06-06 MED ORDER — IPRATROPIUM-ALBUTEROL 0.5-2.5 (3) MG/3ML IN SOLN
3.0000 mL | Freq: Four times a day (QID) | RESPIRATORY_TRACT | Status: DC | PRN
  Administered 2017-06-06 – 2017-06-08 (×6): 3 mL via RESPIRATORY_TRACT
  Filled 2017-06-06 (×6): qty 3

## 2017-06-06 MED ORDER — POTASSIUM CHLORIDE 10 MEQ/50ML IV SOLN
10.0000 meq | INTRAVENOUS | Status: AC
  Administered 2017-06-06 (×6): 10 meq via INTRAVENOUS
  Filled 2017-06-06: qty 50

## 2017-06-06 MED ORDER — PIPERACILLIN-TAZOBACTAM 3.375 G IVPB
3.3750 g | Freq: Three times a day (TID) | INTRAVENOUS | Status: AC
  Administered 2017-06-07 – 2017-06-13 (×19): 3.375 g via INTRAVENOUS
  Filled 2017-06-06 (×21): qty 50

## 2017-06-06 NOTE — Progress Notes (Signed)
RN notified CCMD of family concerns of patients breathing. Patient has remained tachypneic throughout the night. MD is aware. RN will continue to monitor.

## 2017-06-06 NOTE — Progress Notes (Signed)
PT Cancellation Note  Patient Details Name: Jason Callahan MRN: [id number] DOB: Jun 30, 1940   Cancelled Treatment:    Reason Eval/Treat Not Completed: Patient not medically ready.  Pt is obtunded and even more unable to participate with therapy. 06/06/2017  Donnella Sham, [place] [phone]  (pager)   [name] [name] 06/06/2017, 5:44 PM

## 2017-06-06 NOTE — Consult Note (Addendum)
Name: Jason Callahan MRN: [id number] DOB: 1940-03-17    ADMISSION DATE:  05/30/2017 CONSULTATION DATE:  06/06/2017  REFERRING MD :  Dr. Leonel Ramsay  CHIEF COMPLAINT:  Tachypnea/ hypoxia  HISTORY OF PRESENT ILLNESS:   HPI obtained from medical chart review as patient is globally aphasic and unable to provide.   77 year old male with past medical history significant for OSA, GERD, PVA, HTN, and left ICA s/p CEA admitted on 5/19 with stroke.  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  CT abd/ chest/ and neck on 05/28/2017 negative for adenopathy or lymphadenopathy.   Presented with acute hypertension, right sided weakness, facial droop and expressive aphasia.  Found to have large right frontal hemorrhage with resultant global aphasia and RUE plegia.  NSGY was consulted without recommendations for surgical intervention.  He was placed on hypertonic saline.  Patient starting having fevers on 5/21 with rising leukocytosis.  Blood cultures sent, UA negative.  CXR clear on 5/20 with serial films showing stable vascular congestion.  Cortrak placed on 5/22.   Hypertonic saline held on 5/22 due to therapeutic Na.  During the evening of 5/22, patient became more tachypneic, wheezing, and slight increase in O2 requirements from 2 to 4L [place] in the setting of fever.  Repeat CXR now showing bilateral atelectasis.  PCCM consulted to assist with further management and concern of respiratory distress.   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  has a past surgical history that includes Hernia repair (Left) and CEA (2000).   Prior to Admission medications   Medication Sig Start Date End Date Taking?  Authorizing Provider  allopurinol (ZYLOPRIM) 300 MG tablet Take 300 mg by mouth daily.  12/23/13  Yes [provider]  cholecalciferol (VITAMIN D) 1000 UNITS tablet Take 1,000 Units by mouth daily.   Yes [provider]  cyanocobalamin 1000 MCG tablet Take 1,000 mcg by mouth daily.   Yes [provider]  irbesartan-hydrochlorothiazide (AVALIDE) 300-12.5 MG per tablet Take 0.5 tablets by mouth daily.  12/31/13  Yes [provider]  Multiple Vitamin (MULTIVITAMIN) tablet Take 1 tablet by mouth daily. Centrum Silver   Yes [provider]  rosuvastatin (CRESTOR) 20 MG tablet Take 20 mg by mouth daily.   Yes [provider]   No Known Allergies  FAMILY HISTORY:  family history includes Arthritis in his mother; Heart disease in his father. SOCIAL HISTORY:  reports that he has never smoked. He has never used smokeless tobacco. He reports that he drinks about 4.2 oz of alcohol per week. He reports that he does not use drugs.  REVIEW OF SYSTEMS:   Unable to obtain  SUBJECTIVE:  Wife and granddaughter at bedside report that patient's breathing is better now that his temperature is down.    Per RN no significant oral secretions  VITAL SIGNS: Temp:  [99.2 F (37.3 C)-103 F (39.4 C)] 99.8 F (37.7 C) (05/23 0000) Pulse Rate:  [31-121] 87 (05/23 0030) Resp:  [15-34] 22 (05/23 0030) BP: (116-180)/(58-83) 144/67 (05/23 0030) SpO2:  [79 %-100 %] 92 % (05/23 0030) Weight:  [83.2 kg (183 lb 6.8 oz)] 83.2 kg (183 lb 6.8 oz) (05/22 1145)  PHYSICAL EXAMINATION: General:  Adult male lying in bed in no acute distress HEENT: MM  pink/moist, no significant oral secretions, R pupil irregular, left 3/ reactive, L nare cortrak,  Neuro: R facial droop, sleepy, opens eyes to verbal, moves LUE and LLE spontaneously, does not follow commands, no movement of RUE or RLE CV: rrr, no m/r/g PULM: even, shallow, non-labored, RR 20, lungs bilaterally coarse,  diminished in bases, no wheeze GI: soft, non-tender, bs active  Extremities: warm/dry, no peripheral edema  Skin: no rashes   Recent Labs  Lab 06/03/17 0722  06/04/17 0430  06/05/17 0546 06/05/17 1235 06/05/17 1700 06/06/17 0010  NA 144   < > 153*   < > 158* 158* 157* 158*  K 3.7  --  3.6  --  3.2*  --   --   --   CL 105  --  120*  --  120*  --   --   --   CO2 28  --  28  --  28  --   --   --   BUN 19  --  18  --  19  --   --   --   CREATININE 1.09  --  1.15  --  1.19  --   --   --   GLUCOSE 176*  --  174*  --  148*  --   --   --    < > = values in this interval not displayed.   Recent Labs  Lab 06/03/17 0722 06/04/17 0430 06/05/17 0546  HGB 14.4 13.4 14.9  HCT 42.1 40.7 45.7  WBC 18.3* 19.5* 18.8*  PLT 190 172 183   Ct Head Wo Contrast  Result Date: 06/04/2017 CLINICAL DATA:  Intracranial hemorrhage follow-up EXAM: CT HEAD WITHOUT CONTRAST TECHNIQUE: Contiguous axial images were obtained from the base of the skull through the vertex without intravenous contrast. COMPARISON:  06/03/2017 FINDINGS: Brain: Large area of hemorrhage in the left frontal lobe is slightly smaller, likely due to decompression into the ventricles. Small amount of intraventricular hemorrhage is now noted. Subarachnoid hemorrhage on the left is unchanged in volume. No new area of hemorrhage. There is mass-effect and 6.5 mm midline shift to the right. Negative for hydrocephalus. No other acute findings Vascular: Negative for hyperdense vessel Skull: Negative Sinuses/Orbits: Paranasal sinuses clear. Prior ocular surgery on the right. Other: None IMPRESSION: Large intraparenchymal hemorrhage in the left frontal lobe slightly smaller, likely due to decompression the ventricles. Small amount of ventricular hemorrhage noted. Subarachnoid hemorrhage on the left is unchanged 6.5 mm midline shift towards the right is unchanged. Electronically Signed   By: Franchot Gallo M.D.   On: 06/04/2017 09:29   Dg Chest Port 1  View  Result Date: 06/05/2017 CLINICAL DATA:  Short of breath and wheezing EXAM: PORTABLE CHEST 1 VIEW COMPARISON:  06/04/2017, 06/03/2017 CT chest 05/28/2017 FINDINGS: Placement of an esophageal to, the tip is in the left upper quadrant. Low lung volumes. Probable tiny pleural effusions. Increasing atelectasis at both bases. Stable cardiomediastinal silhouette with vascular congestion. No pneumothorax. Right-sided central venous catheter tip is obscured by overlying telemetry leads. Old left-sided rib fractures IMPRESSION: 1. Low lung volumes with suspected tiny right effusion. Increasing atelectasis at both lung bases 2. Stable vascular congestion 3. Esophageal tube tip is in the left upper quadrant Electronically Signed   By: Donavan Foil M.D.   On: 06/05/2017 23:18   Dg Chest Port 1 View  Result Date: 06/04/2017 CLINICAL DATA:  Fever. EXAM: PORTABLE CHEST 1 VIEW COMPARISON:  Chest x-ray from yesterday. FINDINGS: Unchanged  right subclavian central venous catheter. Stable cardiomediastinal silhouette. Stable mild pulmonary vascular congestion. Minimal bibasilar atelectasis. No focal consolidation, pleural effusion, or pneumothorax. No acute osseous abnormality. Old left-sided rib fractures. IMPRESSION: 1. Mild pulmonary vascular congestion without overt edema, unchanged. Electronically Signed   By: Titus Dubin M.D.   On: 06/04/2017 11:39   Dg Abd Portable 1v  Result Date: 06/05/2017 CLINICAL DATA:  Feeding tube placement EXAM: PORTABLE ABDOMEN - 1 VIEW COMPARISON:  CT abdomen and pelvis May 28, 2017 FINDINGS: Feeding tube tip is at the level of the fourth portion of the duodenum. There is moderate stool in the colon. There is no bowel dilatation or air-fluid level to suggest bowel obstruction. No free air. There is a calculus in the right kidney measuring 1.6 x 1.1 cm. IMPRESSION: Feeding tube tip at the level of the fourth portion of the duodenum. No bowel obstruction or free air.  Moderate stool  in colon. 1.6 x 1.1 cm right renal calculus. Electronically Signed   By: Lowella Grip III M.D.   On: 06/05/2017 12:01    BRIEF PATIENT DESCRIPTION:  41 yoM admitted 5/19 with large frontal ICH secondary to HTN with resultant global aphasia and right UE plegia being treated with hypertonic saline (stopped on 5/22 as therapeutic Na) with ongoing fevers since 5/21 without obvious source.   PCCM consulted on 5/23 for tachypnea, wheezing, and slight increased O2 requirements.  ASSESSMENT / PLAN:  Hypoxic respiratory insufficency-  w/ developing atelectasis and stable vascular congestion in the setting of hypertonic saline High risk for intubation and aspiration given significant stroke -   At this time, he is protecting his airway, per family coughs spontaneously but not on command, no significant oral secretions.   - his tachypnea is exacerbated by intermittent fevers - no obvious infiltrate on CXR, and no clear source of his fevers.  P: Continue supplemental O2 for sats > 92%,  Minimize IVF, unable to diuresis given Na  Recheck phos this am, and replete if necessary Duonebs prn  Trend CXR Assess PCT Follow blood cultures/ trend WBC / fever curve Would monitor clinically for now, defer empiric aspiration coverage for now with no obvious CXR inflitrate Aspiration precautions with HOB >30 Aggressive pulmonary hygiene with progressive mobility per PT, frequent turns  Update: Wife and grand-daughter updated of plan of care.  Spoke to his wife regarding plan of care in the event he would need advanced life support given his significant stroke, and at this time, patient to remain a full code.    Kennieth Rad, AGACNP-BC [facility] Pgr: [phone] or if no answer [phone] 06/06/2017, 3:18 AM

## 2017-06-06 NOTE — Progress Notes (Signed)
Pharmacy Antibiotic Note  Jason Callahan is a 77 y.o. male admitted on 06/08/2017 with pneumonia.  Pharmacy has been consulted for vancomycin/zosyn dosing. -WBC= 18.8, tmax= 102.9, SCr= 1.27, CrCl ~ 50,   Plan: -Vancomycin 1500mg  IV x1 followed by 750mg  IV q12h -Zosyn 3.375gm IV q8h -Will follow renal function, cultures and clinical progress   Height: 5\' 10"  (177.8 cm) Weight: 183 lb 6.8 oz (83.2 kg) IBW/kg (Calculated) : 73  Temp (24hrs), Avg:101 F (38.3 C), Min:98.7 F (37.1 C), Max:103 F (39.4 C)  Recent Labs  Lab 05/18/2017 1755 06/13/2017 1759 06/03/17 0722 06/04/17 0430 06/05/17 0546 06/06/17 0534  WBC 12.9*  --  18.3* 19.5* 18.8* 18.8*  CREATININE 1.25* 1.10 1.09 1.15 1.19 1.27*    Estimated Creatinine Clearance: 50.3 mL/min (A) (by C-G formula based on SCr of 1.27 mg/dL (H)).    No Known Allergies  Antimicrobials this admission: 5/23 vanc 5/23 zosyn  Dose adjustments this admission:   Microbiology results: 5/21 blood x2  5/19 MRSA PCR- neg  Thank you for allowing pharmacy to be a part of this patient's care.  Hildred Laser, PharmD Clinical Pharmacist 06/06/2017 3:26 PM

## 2017-06-06 NOTE — Progress Notes (Addendum)
STROKE TEAM PROGRESS NOTE   SUBJECTIVE (INTERVAL HISTORY) His wife, daughter, granddaughter and son are at the bedside. Continues to have fever and Tmax 103. No infection source found so far. Pt more lethargic and drowsy. Still able to open eyes with voice and stimulation. CT repeat showed no significant change. Pt does have mucus in throat. Discussed with family regarding DNR/DNI. They would like to consider and discuss among them first.    OBJECTIVE Temp:  [99.2 F (37.3 C)-103 F (39.4 C)] 101 F (38.3 C) (05/23 1100) Pulse Rate:  [31-109] 79 (05/23 1130) Cardiac Rhythm: Sinus tachycardia (05/23 0800) Resp:  [15-35] 19 (05/23 1130) BP: (106-173)/(58-81) 126/68 (05/23 1130) SpO2:  [79 %-99 %] 94 % (05/23 1130)  Recent Labs  Lab 06/05/17 1935 06/06/17 0009 06/06/17 0340 06/06/17 0735 06/06/17 1130  GLUCAP 148* 176* 141* 155* 151*   Recent Labs  Lab 06/08/2017 1755 05/28/2017 1759  06/03/17 0722  06/04/17 0430  06/05/17 0546 06/05/17 1235 06/05/17 1700 06/06/17 0010 06/06/17 0534  NA 142 142   < > 144   < > 153*   < > 158* 158* 157* 158* 159*  160*  K 4.0 3.8  --  3.7  --  3.6  --  3.2*  --   --   --  3.2*  CL 107 105  --  105  --  120*  --  120*  --   --   --  123*  CO2 25  --   --  28  --  28  --  28  --   --   --  28  GLUCOSE 136* 132*  --  176*  --  174*  --  148*  --   --   --  164*  BUN 20 21*  --  19  --  18  --  19  --   --   --  35*  CREATININE 1.25* 1.10  --  1.09  --  1.15  --  1.19  --   --   --  1.27*  CALCIUM 9.5  --   --  9.3  --  8.8*  --  9.0  --   --   --  9.2  MG  --   --   --   --   --   --   --   --  2.3 2.3  --  2.2  PHOS  --   --   --   --   --   --   --   --  2.6 1.7*  --  2.8   < > = values in this interval not displayed.   Recent Labs  Lab 05/25/2017 1755  AST 25  ALT 26  ALKPHOS 75  BILITOT 1.2  PROT 6.5  ALBUMIN 4.2   Recent Labs  Lab 06/01/2017 1755 05/23/2017 1759 06/03/17 0722 06/04/17 0430 06/05/17 0546 06/06/17 0534  WBC 12.9*   --  18.3* 19.5* 18.8* 18.8*  NEUTROABS 7.0  --   --   --  12.9* 11.9*  HGB 14.7 14.3 14.4 13.4 14.9 15.6  HCT 42.9 42.0 42.1 40.7 45.7 48.3  MCV 94.3  --  96.6 98.5 100.0 101.7*  PLT 172  --  190 172 183 163   No results for input(s): CKTOTAL, CKMB, CKMBINDEX, TROPONINI in the last 168 hours. No results for input(s): LABPROT, INR in the last 72 hours. Recent Labs    06/04/17 1200  COLORURINE  YELLOW  LABSPEC 1.013  PHURINE 5.0  GLUCOSEU NEGATIVE  HGBUR NEGATIVE  BILIRUBINUR NEGATIVE  KETONESUR NEGATIVE  PROTEINUR NEGATIVE  NITRITE NEGATIVE  LEUKOCYTESUR NEGATIVE       Component Value Date/Time   CHOL 111 06/03/2017 0722   TRIG 493 (H) 06/06/2017 0534   HDL 46 06/03/2017 0722   CHOLHDL 2.4 06/03/2017 0722   VLDL 23 06/03/2017 0722   LDLCALC 42 06/03/2017 0722   Lab Results  Component Value Date   HGBA1C 5.3 05/15/2017   No results found for: LABOPIA, COCAINSCRNUR, LABBENZ, AMPHETMU, THCU, LABBARB  No results for input(s): ETH in the last 168 hours.  I have personally reviewed the radiological images below and agree with the radiology interpretations.  Ct Angio Head W Or Wo Contrast  Result Date: 05/26/2017 CLINICAL DATA:  Intracranial hemorrhage EXAM: CT ANGIOGRAPHY HEAD AND NECK TECHNIQUE: Multidetector CT imaging of the head and neck was performed using the standard protocol during bolus administration of intravenous contrast. Multiplanar CT image reconstructions and MIPs were obtained to evaluate the vascular anatomy. Carotid stenosis measurements (when applicable) are obtained utilizing NASCET criteria, using the distal internal carotid diameter as the denominator. CONTRAST:  39mL ISOVUE-370 IOPAMIDOL (ISOVUE-370) INJECTION 76% COMPARISON:  CT head immediately preceding FINDINGS: CTA NECK FINDINGS Aortic arch: Mild atherosclerotic disease aortic arch. Proximal great vessels widely patent Right carotid system: Right common carotid artery widely patent. Right carotid  artery endarterectomy widely patent Left carotid system: Left common carotid artery widely patent. Mild atherosclerotic disease. 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 Left external carotid artery widely patent. Vertebral arteries: Both vertebral arteries are widely patent without stenosis. Skeleton: No acute abnormality. Cervical spine degenerative changes. Other neck: Negative for mass or adenopathy. Upper chest: Negative Review of the MIP images confirms the above findings CTA HEAD FINDINGS Anterior circulation: Atherosclerotic calcification right cavernous carotid with mild stenosis. Right anterior and middle cerebral arteries patent without significant stenosis. 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 Left anterior and middle cerebral arteries patent without stenosis. Posterior circulation: Both vertebral arteries patent to the basilar. Basilar widely patent. PICA, superior cerebellar, posterior cerebral arteries patent without stenosis. Venous sinuses: Patent Anatomic variants: None Delayed phase: Large hematoma left frontal lobe unchanged from preceding CT. Local mass-effect and 4 mm midline shift to the right is unchanged. No enhancing mass lesion identified. Review of the MIP images confirms the above findings IMPRESSION: 1. Large left frontal hematoma. No evidence of underlying vascular malformation or tumor 2. Right carotid endarterectomy widely patent 3. 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 Left cavernous carotid is patent but severely stenotic. Electronically Signed   By: Franchot Gallo M.D.   On: 05/15/2017 18:51   Ct Head Wo Contrast  Result Date: 06/04/2017 CLINICAL DATA:  Intracranial hemorrhage follow-up EXAM: CT HEAD WITHOUT CONTRAST TECHNIQUE: Contiguous  axial images were obtained from the base of the skull through the vertex without intravenous contrast. COMPARISON:  06/03/2017 FINDINGS: Brain: Large area of hemorrhage in the left frontal lobe is slightly smaller, likely due to decompression into the ventricles. Small amount of intraventricular hemorrhage is now noted. Subarachnoid hemorrhage on the left is unchanged in volume. No new area of hemorrhage. There is mass-effect and 6.5 mm midline shift to the right. Negative for hydrocephalus. No other acute findings Vascular: Negative for hyperdense  vessel Skull: Negative Sinuses/Orbits: Paranasal sinuses clear. Prior ocular surgery on the right. Other: None IMPRESSION: Large intraparenchymal hemorrhage in the left frontal lobe slightly smaller, likely due to decompression the ventricles. Small amount of ventricular hemorrhage noted. Subarachnoid hemorrhage on the left is unchanged 6.5 mm midline shift towards the right is unchanged. Electronically Signed   By: Franchot Gallo M.D.   On: 06/04/2017 09:29   Ct Head Wo Contrast  Result Date: 06/03/2017 CLINICAL DATA:  Cerebral hemorrhage. EXAM: CT HEAD WITHOUT CONTRAST TECHNIQUE: Contiguous axial images were obtained from the base of the skull through the vertex without intravenous contrast. COMPARISON:  CT head without contrast 05/31/2017. CTA head and neck 05/30/2017. FINDINGS: Brain: Left frontal parenchymal hemorrhage has increased in size, now measuring 9.3 x 5.1 x 4.6 cm. Previous measurements were 7.0 x 4.7 x 3.8 cm in the same planes. There is increased mass effect on the left lateral ventricle. No intraventricular hemorrhage is evident. Surrounding edema is noted. Maximal midline shift is 12 mm, increased from 6 mm on the prior exam. There is no hydrocephalus. No downward herniation is present. Basal cisterns are intact. The brainstem and cerebellum are normal. No new cortical infarcts are present. No separate hemorrhage is evident. Vascular: No hyperdense  vessel or unexpected calcification. Skull: Calvarium is intact. No focal lytic or blastic lesions are present. No significant extracranial soft tissue injury is present. Sinuses/Orbits: Right scleral banding is again noted. Right lens replacement is noted. Orbits are otherwise unremarkable. The paranasal sinuses and mastoid air cells are clear. IMPRESSION: 1. Expanding left frontal hematoma with increased mass effect as described. 2. Increasing mass effect on the left lateral ventricle without intraventricular hemorrhage or downward herniation. Critical Value/emergent results were called by telephone at the time of interpretation on 06/03/2017 at 7:32 am to Dr. Lorraine Lax , who verbally acknowledged these results. Electronically Signed   By: San Morelle M.D.   On: 06/03/2017 07:32   Ct Soft Tissue Neck W Contrast  Result Date: 05/28/2017 CLINICAL DATA:  Monoclonal lymphocytosis.  Rule out adenopathy EXAM: CT NECK WITH CONTRAST TECHNIQUE: Multidetector CT imaging of the neck was performed using the standard protocol following the bolus administration of intravenous contrast. CONTRAST:  130mL ISOVUE-300 IOPAMIDOL (ISOVUE-300) INJECTION 61% COMPARISON:  None. FINDINGS: Pharynx and larynx: Normal. No mass or swelling. Salivary glands: No inflammation, mass, or stone. Thyroid: Negative Lymph nodes: No pathologic or enlarged lymph nodes in the neck. Vascular: Right carotid  endarterectomy is patent. Occluded left internal carotid artery at the origin. Jugular vein patent bilaterally. Limited intracranial: Negative Visualized orbits: Right ocular surgery.  No orbital mass. Mastoids and visualized paranasal sinuses: Negative Skeleton: Degenerative changes in the cervical spine. No acute skeletal lesion. Upper chest: Chest CT from today reported separately Other: None IMPRESSION: Negative for mass or adenopathy in the neck Occluded left internal carotid artery. Right carotid endarterectomy is patent. Electronically  Signed   By: Franchot Gallo M.D.   On: 05/28/2017 16:29   Ct Angio Neck W Or Wo Contrast  Result Date: 05/17/2017 CLINICAL DATA:  Intracranial hemorrhage EXAM: CT ANGIOGRAPHY HEAD AND NECK TECHNIQUE: Multidetector CT imaging of the head and neck was performed using the standard protocol during bolus administration of intravenous contrast. Multiplanar CT image reconstructions and MIPs were obtained to evaluate the vascular anatomy. Carotid stenosis measurements (when applicable) are obtained utilizing NASCET criteria, using the distal internal carotid diameter as the denominator. CONTRAST:  45mL ISOVUE-370 IOPAMIDOL (ISOVUE-370) INJECTION 76% COMPARISON:  CT head immediately  preceding FINDINGS: CTA NECK FINDINGS Aortic arch: Mild atherosclerotic disease aortic arch. Proximal great vessels widely patent Right carotid system: Right common carotid artery widely patent. Right carotid artery endarterectomy widely patent Left carotid system: Left common carotid artery widely patent. Mild atherosclerotic disease. 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 Left external carotid artery widely patent. Vertebral arteries: Both vertebral arteries are widely patent without stenosis. Skeleton: No acute abnormality. Cervical spine degenerative changes. Other neck: Negative for mass or adenopathy. Upper chest: Negative Review of the MIP images confirms the above findings CTA HEAD FINDINGS Anterior circulation: Atherosclerotic calcification right cavernous carotid with mild stenosis. Right anterior and middle cerebral arteries patent without significant stenosis. 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 Left anterior and middle cerebral arteries patent without stenosis. Posterior circulation: Both vertebral arteries patent to the basilar. Basilar widely  patent. PICA, superior cerebellar, posterior cerebral arteries patent without stenosis. Venous sinuses: Patent Anatomic variants: None Delayed phase: Large hematoma left frontal lobe unchanged from preceding CT. Local mass-effect and 4 mm midline shift to the right is unchanged. No enhancing mass lesion identified. Review of the MIP images confirms the above findings IMPRESSION: 1. Large left frontal hematoma. No evidence of underlying vascular malformation or tumor 2. Right carotid endarterectomy widely patent 3. 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 Left cavernous carotid is patent but severely stenotic. Electronically Signed   By: Franchot Gallo M.D.   On: 05/27/2017 18:51   Ct Chest W Contrast  Result Date: 05/29/2017 CLINICAL DATA:  77 year old male for initial staging of chronic lymphocytic leukemia. EXAM: CT CHEST, ABDOMEN, AND PELVIS WITH CONTRAST TECHNIQUE: Multidetector CT imaging of the chest, abdomen and pelvis was performed following the standard protocol during bolus administration of intravenous contrast. CONTRAST:  195mL ISOVUE-300 IOPAMIDOL (ISOVUE-300) INJECTION 61% COMPARISON:  01/28/2004 chest CT, 06/29/2009 abdominal and pelvic CT and prior studies FINDINGS: CT CHEST FINDINGS Cardiovascular: Heart size is normal. Multivessel coronary artery calcifications identified. Thoracic aortic atherosclerotic calcifications noted without aneurysm. No pericardial effusion. Mediastinum/Nodes: No enlarged mediastinal, hilar, or axillary lymph nodes. Thyroid gland, trachea, and esophagus demonstrate no significant findings. Lungs/Pleura: Minimal dependent atelectasis/interstitial prominence is unchanged. Minimal LEFT basilar scarring again noted. No airspace disease, consolidation, mass, nodule, pleural effusion or pneumothorax noted. Musculoskeletal: No chest wall mass or suspicious bone lesions identified. CT ABDOMEN PELVIS FINDINGS Hepatobiliary:  The liver and gallbladder are unremarkable. No biliary dilatation. Pancreas: Unremarkable Spleen: Unremarkable Adrenals/Urinary Tract: Kidneys, adrenal glands and bladder are unremarkable except for nonobstructing RIGHT renal calculi. The largest RIGHT renal calculus measures 10 x 16 mm within the mid RIGHT kidney. Stomach/Bowel: Stomach is within normal limits. No evidence of bowel wall thickening, distention, or inflammatory changes. Vascular/Lymphatic: Aortic atherosclerosis. No enlarged abdominal or pelvic lymph nodes. Reproductive: Moderate to marked prostate enlargement noted. Other: No ascites, focal collection or pneumoperitoneum. Musculoskeletal: No acute or suspicious bony abnormalities. Degenerative changes within the lumbar spine again noted. Wedge compression fracture of L1 is unchanged. IMPRESSION: 1. No evidence of lymphadenopathy, splenomegaly or metastatic disease. 2. No acute abnormalities. 3. Nonobstructing RIGHT renal calculi 4. Moderate to marked prostate enlargement 5. Coronary artery and Aortic Atherosclerosis (ICD10-I70.0). Electronically Signed   By: Margarette Canada M.D.   On: 05/29/2017 08:57   [name] ZC Contrast  Result Date: 06/03/2017 CLINICAL DATA:  Cerebral hemorrhage.  EXAM: MRI HEAD WITHOUT AND WITH CONTRAST TECHNIQUE: Multiplanar, multiecho pulse sequences of the brain and surrounding structures were obtained without and with intravenous contrast. CONTRAST:  59mL MULTIHANCE GADOBENATE DIMEGLUMINE 529 MG/ML IV SOLN COMPARISON:  Head CT 06/03/2017 at 0609 hours FINDINGS: Brain: 9.6 x 5.2 x 4.7 cm left frontal lobe hematoma has not significantly changed in size. There is mild surrounding edema with unchanged mass effect on the left lateral ventricle. 12 mm of rightward midline shift is unchanged. A small amount of adjacent left frontal subarachnoid hemorrhage is again seen. There is trace hemorrhage in the occipital horn of the right lateral ventricle. No acute infarct is evident  separate from the left frontal hematoma. No underlying enhancing mass lesion is evident. No chronic brain hemorrhages are identified. There is no extra-axial fluid collection. Mild cerebral atrophy is within normal limits for age. A few punctate foci of cerebral white matter T2 hyperintensity are also within normal limits for age. Vascular: Major intracranial vascular flow voids are preserved. Skull and upper cervical spine: Unremarkable bone marrow signal. Sinuses/Orbits: Right cataract extraction and scleral banding. No significant sinus disease or significant mastoid fluid. Other: None. IMPRESSION: 1. Large left frontal lobe hematoma, unchanged from today's earlier CT. Unchanged mass effect with 12 mm of midline shift. No evidence of underlying mass. No chronic hemorrhages to suggest cerebral amyloid angiopathy. 2. Small volume adjacent subarachnoid hemorrhage and trace intraventricular hemorrhage. No evidence of hydrocephalus. Electronically Signed   By: Logan Bores M.D.   On: 06/03/2017 16:43   Ct Abdomen Pelvis W Contrast  Result Date: 05/29/2017 CLINICAL DATA:  77 year old male for initial staging of chronic lymphocytic leukemia. EXAM: CT CHEST, ABDOMEN, AND PELVIS WITH CONTRAST TECHNIQUE: Multidetector CT imaging of the chest, abdomen and pelvis was performed following the standard protocol during bolus administration of intravenous contrast. CONTRAST:  137mL ISOVUE-300 IOPAMIDOL (ISOVUE-300) INJECTION 61% COMPARISON:  01/28/2004 chest CT, 06/29/2009 abdominal and pelvic CT and prior studies FINDINGS: CT CHEST FINDINGS Cardiovascular: Heart size is normal. Multivessel coronary artery calcifications identified. Thoracic aortic atherosclerotic calcifications noted without aneurysm. No pericardial effusion. Mediastinum/Nodes: No enlarged mediastinal, hilar, or axillary lymph nodes. Thyroid gland, trachea, and esophagus demonstrate no significant findings. Lungs/Pleura: Minimal dependent  atelectasis/interstitial prominence is unchanged. Minimal LEFT basilar scarring again noted. No airspace disease, consolidation, mass, nodule, pleural effusion or pneumothorax noted. Musculoskeletal: No chest wall mass or suspicious bone lesions identified. CT ABDOMEN PELVIS FINDINGS Hepatobiliary: The liver and gallbladder are unremarkable. No biliary dilatation. Pancreas: Unremarkable Spleen: Unremarkable Adrenals/Urinary Tract: Kidneys, adrenal glands and bladder are unremarkable except for nonobstructing RIGHT renal calculi. The largest RIGHT renal calculus measures 10 x 16 mm within the mid RIGHT kidney. Stomach/Bowel: Stomach is within normal limits. No evidence of bowel wall thickening, distention, or inflammatory changes. Vascular/Lymphatic: Aortic atherosclerosis. No enlarged abdominal or pelvic lymph nodes. Reproductive: Moderate to marked prostate enlargement noted. Other: No ascites, focal collection or pneumoperitoneum. Musculoskeletal: No acute or suspicious bony abnormalities. Degenerative changes within the lumbar spine again noted. Wedge compression fracture of L1 is unchanged. IMPRESSION: 1. No evidence of lymphadenopathy, splenomegaly or metastatic disease. 2. No acute abnormalities. 3. Nonobstructing RIGHT renal calculi 4. Moderate to marked prostate enlargement 5. Coronary artery and Aortic Atherosclerosis (ICD10-I70.0). Electronically Signed   By: Margarette Canada M.D.   On: 05/29/2017 08:57   Dg Chest Port 1 View  Result Date: 06/03/2017 CLINICAL DATA:  Central line placement. EXAM: PORTABLE CHEST 1 VIEW COMPARISON:  January 29, 2004 FINDINGS: There is a new  right central line terminating in the central SVC. No pneumothorax. The heart size borderline. The hila and mediastinum are unremarkable given technique. No overt edema or suspicious infiltrate. Mild atelectasis in the right base. IMPRESSION: The new right central line is in good position.  No pneumothorax. Electronically Signed   By: Dorise Bullion III M.D   On: 06/03/2017 12:11   Ct Head Code Stroke Wo Contrast  Result Date: 05/19/2017 CLINICAL DATA:  Code stroke. Altered level of consciousness. Aphasia EXAM: CT HEAD WITHOUT CONTRAST TECHNIQUE: Contiguous axial images were obtained from the base of the skull through the vertex without intravenous contrast. COMPARISON:  None. FINDINGS: Brain: Large area of high-density hemorrhage in the left frontal lobe with mixed densities. This measures 6.4 x 3.9 x 5.0 cm corresponding to 61 mL of blood. Surrounding edema. There is mass-effect and 4 mm midline shift to the right. No subarachnoid or subdural hemorrhage Mild atrophy.  No other areas of hemorrhage or acute infarct Vascular: Normal arterial density. Skull: Negative Sinuses/Orbits: Paranasal sinuses show mild mucosal edema. Right cataract surgery Other: None ASPECTS (Adelino Stroke Program Early CT Score) - Ganglionic level infarction (caudate, lentiform nuclei, internal capsule, insula, M1-M3 cortex): 7 - Supraganglionic infarction (M4-M6 cortex): 3 Total score (0-10 with 10 being normal): 10 IMPRESSION: 1. Large hemorrhage in the left frontal lobe, 61 mL. Differential diagnostic considerations include amyloid, hypertension, vascular malformation. Hemorrhagic stroke not considered likely with acute onset of symptoms. 2. ASPECTS is 10 3. These results were called by telephone at the time of interpretation on 06/09/2017 at 6:16 pm to Dr. Rory Percy , who verbally acknowledged these results. Electronically Signed   By: Franchot Gallo M.D.   On: 05/23/2017 18:17   Dg Chest Port 1 View  Result Date: 06/04/2017 CLINICAL DATA:  Fever. EXAM: PORTABLE CHEST 1 VIEW COMPARISON:  Chest x-ray from yesterday. FINDINGS: Unchanged right subclavian central venous catheter. Stable cardiomediastinal silhouette. Stable mild pulmonary vascular congestion. Minimal bibasilar atelectasis. No focal consolidation, pleural effusion, or pneumothorax. No acute osseous  abnormality. Old left-sided rib fractures. IMPRESSION: 1. Mild pulmonary vascular congestion without overt edema, unchanged. Electronically Signed   By: Titus Dubin M.D.   On: 06/04/2017 11:39   TTE - Left ventricle: The cavity size was normal. Wall thickness was   increased in a pattern of mild LVH. Systolic function was   vigorous. The estimated ejection fraction was in the range of 65%   to 70%. Wall motion was normal; there were no regional wall   motion abnormalities. - Aortic valve: Trileaflet; mildly thickened, moderately calcified   leaflets. Moderate calcification involving the noncoronary cusp. - Pulmonary arteries: Systolic pressure was moderately increased.   PA peak pressure: 51 mm Hg (S).  Ct Head Wo Contrast  Result Date: 06/06/2017 CLINICAL DATA:  Follow-up intracranial hemorrhage. EXAM: CT HEAD WITHOUT CONTRAST TECHNIQUE: Contiguous axial images were obtained from the base of the skull through the vertex without intravenous contrast. COMPARISON:  CT HEAD Jun 04, 2017 and priors. FINDINGS: BRAIN: Evolving LEFT frontal lobe hematoma with similar surrounding low-density vasogenic edema and minimal satellite hemorrhages. Intraventricular extension with layering blood products in occipital horns. No hydrocephalus. Small volume subarachnoid hemorrhage. 7 mm LEFT-to-RIGHT midline shift. No ventricular entrapment. No acute large vascular territory infarcts. Basal cisterns are patent. VASCULAR: Moderate calcific atherosclerosis of the carotid siphons. SKULL: No skull fracture. No significant scalp soft tissue swelling. SINUSES/ORBITS: LEFT nasogastric tube. Trace mastoid effusions and paranasal sinus mucosal thickening. The included ocular globes and  orbital contents are non-suspicious. Status post RIGHT scleral banding in RIGHT ocular lens implant. OTHER: None. IMPRESSION: 1. Evolving LEFT frontal lobe hematoma with similar intraventricular extension. 2. 7 mm LEFT-to-RIGHT midline shift.  No ventricular entrapment or hydrocephalus. 3. Similar small volume subarachnoid hemorrhage. Electronically Signed   By: Elon Alas M.D.   On: 06/06/2017 04:06   Dg Chest Port 1 View  Result Date: 06/05/2017 CLINICAL DATA:  Short of breath and wheezing EXAM: PORTABLE CHEST 1 VIEW COMPARISON:  06/04/2017, 06/03/2017 CT chest 05/28/2017 FINDINGS: Placement of an esophageal to, the tip is in the left upper quadrant. Low lung volumes. Probable tiny pleural effusions. Increasing atelectasis at both bases. Stable cardiomediastinal silhouette with vascular congestion. No pneumothorax. Right-sided central venous catheter tip is obscured by overlying telemetry leads. Old left-sided rib fractures IMPRESSION: 1. Low lung volumes with suspected tiny right effusion. Increasing atelectasis at both lung bases 2. Stable vascular congestion 3. Esophageal tube tip is in the left upper quadrant Electronically Signed   By: Donavan Foil M.D.   On: 06/05/2017 23:18    PHYSICAL EXAM  Temp:  [99.2 F (37.3 C)-103 F (39.4 C)] 101 F (38.3 C) (05/23 1100) Pulse Rate:  [31-109] 79 (05/23 1130) Resp:  [15-35] 19 (05/23 1130) BP: (106-173)/(58-81) 126/68 (05/23 1130) SpO2:  [79 %-99 %] 94 % (05/23 1130)  General - Well nourished, well developed, lethargic and sleepy.  Ophthalmologic - fundi not visualized due to noncooperation.  Cardiovascular - Regular rhythm but mild tachycardia.  Neuro - lethargic and sleepy and need stimulation to open eyes, not able to maintain eyes open without repetitive stimulation. Global aphasia, not following commands. Left pupil round 2.61mm, right surgical pupil dilated with irregular sharp, left gaze preference, not cross midline. Blinking to visual threat on the left but not on the right. Right facial droop and tongue midline in mouth. LUE spontaneous movement against gravity able to hold up on exam. BLE withdraw on pain stimulation, L>R. RUE 1/5 withdraw on pain. DTR 1+ and right  babinski positive. Sensation, coordination and gait not tested.   ASSESSMENT/PLAN Jason Callahan is a 77 y.o. 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 No tPA given due to [place].    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  Leukocytosis following with oncology  WBC 18.3->19.5->18.8->18.8  Pan CT negative so far  Continue outpt follow up with oncology  Hypertension and tachycardia Stable  BP goal < 160  On cleviprex  on losartan 100mg  daily, amlodipine 10, and metoprolol  Close monitoring  Hyperlipidemia  Home meds:  crestor   LDL 42, goal < 70  Hold off statin for now due to acute  ICH  Dysphagia  Consider cortrak placed  On TF  Other Stroke Risk Factors  Advanced age  PVD  Obstructive sleep apnea, on CPAP at home  Other Active Problems  Hyperglycemia  Hypokalemia - K 3.2->3.2 - supplement  Hospital day # 4  This patient is critically ill due to large right ICH, cerebral edema, HTN, carotid occlusion, fever and at significant risk of neurological worsening, death form cerebral edema, brain herniation, heart failure, aspiration pneumonia. This patient's care requires constant monitoring of vital signs, hemodynamics, respiratory and cardiac monitoring, review of multiple databases, neurological assessment, discussion with family, other specialists and medical decision making of high complexity. I spent 35 minutes of neurocritical care time in the care of this patient. I had long discussion with wife, daughter and son at bedside, updated pt current condition, treatment plan and potential prognosis. They expressed understanding and appreciation. I also discussed with Dr. Dominica Severin. Family is discussing about DNR/DNI options.    Rosalin Hawking, MD PhD Stroke Neurology 06/06/2017 12:03 PM    To contact Stroke Continuity provider, please refer to [email or web address] After hours, contact General Neurology

## 2017-06-06 NOTE — Care Management Note (Signed)
Case Management Note  Patient Details  Name: Jason Callahan MRN: [id number] Date of Birth: [birth year]  Subjective/Objective:   This 77 y.o. male admitted with acute onset Rt sided weakness, Rt facial droop, and aphasia.   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  Pt independent PTA, lives with wife.                  Action/Plan: Met with pt and wife to discuss dc planning.  PT/OT recommending CIR.  Wife states she can provide 24h care at dc, and will have assistance from other family members and friends.  We discussed possible CIR, with backup plan of SNF,depending on progress.  She verbalizes understanding of difference between CIR and SNF.  Will follow progress.  Expected Discharge Date:  06/10/17               Expected Discharge Plan:     In-House Referral:  Clinical Social Work  Discharge planning Services  CM Consult  Post Acute Care Choice:    Choice offered to:     DME Arranged:    DME Agency:     HH Arranged:    HH Agency:     Status of Service:  In process, will continue to follow  If discussed at Long Length of Stay Meetings, dates discussed:    Additional Comments:  Reinaldo Raddle, RN, BSN  Trauma/Neuro ICU Case Manager [phone]

## 2017-06-06 NOTE — Progress Notes (Signed)
CPAP not placed on the patient as he has a feeding tube in his nose. Will continue to monitor patient.

## 2017-06-06 NOTE — Progress Notes (Signed)
MD notified of patient increased work of breathing, tachypnea and increased temperature. RN will continue to monitor.

## 2017-06-06 NOTE — Progress Notes (Signed)
SLP Cancellation Note  Patient Details Name: [name] MRN: [id number] DOB: 02/20/1940   Cancelled treatment:       Reason Eval/Treat Not Completed: Other (comment);Medical issues which prohibited therapy(pt npo, tachypnea and febrile, defer slp today)   Claudie Fisherman, [facility] [facility] SLP [phone]

## 2017-06-06 NOTE — Progress Notes (Signed)
OT Cancellation Note  Patient Details Name: JERMY COUPER MRN: [id number] DOB: [birth year]   Cancelled Treatment:    Reason Eval/Treat Not Completed: Medical issues which prohibited therapy.  Pt tachypneic with increased WOB.  Will reattempt as appropriate.  Caterin Tabares [place], OTR/L [phone]   Lucille Passy M 06/06/2017, 11:30 AM

## 2017-06-06 NOTE — Progress Notes (Signed)
PULMONARY / CRITICAL CARE MEDICINE   Name: Jason Callahan MRN: [id number] DOB: Dec 20, 1940    ADMISSION DATE:  06/13/2017 CONSULTATION DATE: 06/06/2017  REFERRING MD:  Erlinda Hong  CHIEF COMPLAINT: Fever and increasing respiratory distress 4 days after presenting with a left frontal intracranial hemorrhage.  HISTORY OF PRESENT ILLNESS:        77 year old male with past medical history significant for OSA, GERD, PVA, HTN, and left ICA s/p CEA admitted on 5/19 with stroke.  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  CT abd/ chest/ and neck on 05/28/2017 negative for adenopathy or lymphadenopathy.   Presented with acute hypertension, right sided weakness, facial droop and expressive aphasia.  Found to have large right frontal hemorrhage with resultant global aphasia and RUE plegia.  NSGY was consulted without recommendations for surgical intervention.  He was placed on hypertonic saline.  Patient starting having fevers on 5/21 with rising leukocytosis.  Blood cultures sent, UA negative.  CXR clear on 5/20 with serial films showing stable vascular congestion.  Cortrak placed on 5/22.   Hypertonic saline held on 5/22 due to therapeutic Na.  During the evening of 5/22, patient became more tachypneic, wheezing, and slight increase in O2 requirements from 2 to 4L [place] in the setting of fever.  Repeat CXR now showing bilateral atelectasis.  PCCM consulted to assist with further management and concern of respiratory  I was asked by Dr. Phoebe Sharps afternoon to again evaluate his fever and tachypnea.  Patient is not having any significant cough and a chest x-ray obtained yesterday was only really remarkable for a small right pleural effusion.  Urine culture of 521 has shown no growth.  Does have a central catheter in place which was placed during this admission.  He also has a history of gout however his wife has not noted any inflamed joints during this admission.  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  Urine culture obtained on 5/21 showed no growth.  The patient also has a history of diverticulitis but apparently this was in the very distant past and is not been a recurring issue.  He is not been on pharmacologic DVT prophylaxis due to his intracranial hemorrhage.   PAST MEDICAL HISTORY :  He  has a past medical history of Carotid occlusion, left total[I65.21], Diverticulitis, GERD (gastroesophageal reflux disease), Gout, Hernia, inguinal, left, Hypertension, LVH (left ventricular hypertrophy), Nephrolithiasis, Osteopenia, PVD (peripheral vascular disease) ([place]), and Snoring (02/10/2014).  PAST SURGICAL HISTORY: He  has a past surgical history that includes Hernia repair (Left) and CEA (2000).  No Known Allergies  No current facility-administered medications on file prior to encounter.    Current Outpatient Medications on File Prior to Encounter  Medication Sig  . allopurinol (ZYLOPRIM) 300 MG tablet Take 300 mg by mouth daily.   . cholecalciferol (VITAMIN D) 1000 UNITS tablet Take 1,000 Units by mouth daily.  . cyanocobalamin 1000 MCG tablet Take 1,000 mcg by mouth daily.  . irbesartan-hydrochlorothiazide (AVALIDE) 300-12.5 MG per tablet Take 0.5 tablets by mouth daily.   . Multiple Vitamin (MULTIVITAMIN) tablet Take 1 tablet by mouth daily. Centrum Silver  . rosuvastatin (CRESTOR) 20 MG tablet Take 20 mg by mouth daily.    FAMILY HISTORY:  His indicated that his mother is deceased. He indicated that his father is deceased. He indicated that his sister  is alive. He indicated that his brother is alive.   SOCIAL HISTORY: He  reports that he has never smoked. He has never used smokeless tobacco. He reports that he drinks about 4.2 oz of alcohol per week. He reports that he does not use drugs.  REVIEW OF SYSTEMS:   As above  SUBJECTIVE:  As above  VITAL SIGNS: BP 136/78    Pulse 89   Temp 98.7 F (37.1 C) (Axillary)   Resp (!) 25   Ht 5\' 10"  (1.778 m)   Wt 183 lb 6.8 oz (83.2 kg)   SpO2 91%   BMI 26.32 kg/m   HEMODYNAMICS:    VENTILATOR SETTINGS:    INTAKE / OUTPUT: I/O last 3 completed shifts: In: 3659.3 [I.V.:2804.7; NG/GT:354.6; IV Piggyback:500] Out: 4165 [Urine:4165]  PHYSICAL EXAMINATION: General: Elderly male who is tachypneic not yet labored.  He is purposeful but not interactive  neuro: Moving the left upper extremity purposefully.  Not verbalizing or following instructions.  The right side is plegic.  Pupils are equal. Cardiovascular: S1 and S2 are regular without murmur rub or gallop Lungs: He has a good deal of gurgling in the upper airway and is somewhat tachypneic.  There is symmetric air movement a few scattered rhonchi and no wheezes Abdomen: The abdomen is flat and soft without any organomegaly masses tenderness guarding or rebound Musculoskeletal: I do not appreciate any swollen or hot joints of the lower or upper extremities. Skin: The right subclavian central line in place, there is no acute inflammation  LABS:  BMET Recent Labs  Lab 06/04/17 0430  06/05/17 0546  06/06/17 0010 06/06/17 0534 06/06/17 1146  NA 153*   < > 158*   < > 158* 159*  160* 154*  K 3.6  --  3.2*  --   --  3.2*  --   CL 120*  --  120*  --   --  123*  --   CO2 28  --  28  --   --  28  --   BUN 18  --  19  --   --  35*  --   CREATININE 1.15  --  1.19  --   --  1.27*  --   GLUCOSE 174*  --  148*  --   --  164*  --    < > = values in this interval not displayed.    Electrolytes Recent Labs  Lab 06/04/17 0430 06/05/17 0546 06/05/17 1235 06/05/17 1700 06/06/17 0534  CALCIUM 8.8* 9.0  --   --  9.2  MG  --   --  2.3 2.3 2.2  PHOS  --   --  2.6 1.7* 2.8    CBC Recent Labs  Lab 06/04/17 0430 06/05/17 0546 06/06/17 0534  WBC 19.5* 18.8* 18.8*  HGB 13.4 14.9 15.6  HCT 40.7 45.7 48.3  PLT 172 183 163    Coag's Recent Labs  Lab  06/13/2017 1755  APTT 27  INR 1.04    Sepsis Markers Recent Labs  Lab 06/06/17 0534  PROCALCITON <0.10    ABG Recent Labs  Lab 06/05/17 2305  PHART 7.423  PCO2ART 45.4  PO2ART 69.2*    Liver Enzymes Recent Labs  Lab 06/03/2017 1755  AST 25  ALT 26  ALKPHOS 75  BILITOT 1.2  ALBUMIN 4.2    Cardiac Enzymes No results for input(s): TROPONINI, PROBNP in the last 168 hours.  Glucose Recent Labs  Lab 06/05/17 1632 06/05/17 1935  06/06/17 0009 06/06/17 0340 06/06/17 0735 06/06/17 1130  GLUCAP 149* 148* 176* 141* 155* 151*    Imaging Ct Head Wo Contrast  Result Date: 06/06/2017 CLINICAL DATA:  Follow-up intracranial hemorrhage. EXAM: CT HEAD WITHOUT CONTRAST TECHNIQUE: Contiguous axial images were obtained from the base of the skull through the vertex without intravenous contrast. COMPARISON:  CT HEAD Jun 04, 2017 and priors. FINDINGS: BRAIN: Evolving LEFT frontal lobe hematoma with similar surrounding low-density vasogenic edema and minimal satellite hemorrhages. Intraventricular extension with layering blood products in occipital horns. No hydrocephalus. Small volume subarachnoid hemorrhage. 7 mm LEFT-to-RIGHT midline shift. No ventricular entrapment. No acute large vascular territory infarcts. Basal cisterns are patent. VASCULAR: Moderate calcific atherosclerosis of the carotid siphons. SKULL: No skull fracture. No significant scalp soft tissue swelling. SINUSES/ORBITS: LEFT nasogastric tube. Trace mastoid effusions and paranasal sinus mucosal thickening. The included ocular globes and orbital contents are non-suspicious. Status post RIGHT scleral banding in RIGHT ocular lens implant. OTHER: None. IMPRESSION: 1. Evolving LEFT frontal lobe hematoma with similar intraventricular extension. 2. 7 mm LEFT-to-RIGHT midline shift. No ventricular entrapment or hydrocephalus. 3. Similar small volume subarachnoid hemorrhage. Electronically Signed   By: Elon Alas M.D.   On:  06/06/2017 04:06   Dg Chest Port 1 View  Result Date: 06/05/2017 CLINICAL DATA:  Short of breath and wheezing EXAM: PORTABLE CHEST 1 VIEW COMPARISON:  06/04/2017, 06/03/2017 CT chest 05/28/2017 FINDINGS: Placement of an esophageal to, the tip is in the left upper quadrant. Low lung volumes. Probable tiny pleural effusions. Increasing atelectasis at both bases. Stable cardiomediastinal silhouette with vascular congestion. No pneumothorax. Right-sided central venous catheter tip is obscured by overlying telemetry leads. Old left-sided rib fractures IMPRESSION: 1. Low lung volumes with suspected tiny right effusion. Increasing atelectasis at both lung bases 2. Stable vascular congestion 3. Esophageal tube tip is in the left upper quadrant Electronically Signed   By: Donavan Foil M.D.   On: 06/05/2017 23:18     STUDIES:    CULTURES:  ANTIBIOTICS: Empiric Zosyn and vancomycin started 5/23  SIGNIFICANT EVENTS:  LINES/TUBES: CVP line is in place which was placed on 5/19  DISCUSSION:      This is a 77 year old who is suffered from a large left frontal intracranial hemorrhage.  He is now febrile and tachypneic with an increasing oxygen requirement.  Potential sources of fever include a central fever, pancreatitis related to his hypertriglyceridemia, history of CLL, history of gout, a history of diverticulitis, and potential aspiration.   ASSESSMENT / PLAN:  PULMONARY A: I am going to cover him empirically for potential aspiration and diverticulitis with a combination of vancomycin and Zosyn.  I am not going to pursue more aggressive diagnostics as the patient does not wish me to intubate this patient even should he decline.  Have ordered Dopplers of the lower extremities to rule out DVTs as a source of fever as he has not been on chemical prophylaxis due to his intracranial hemorrhage.    INFECTIOUS A: Fever.  Based on the normal procalcitonin this morning his recent negative chest x-ray and  negative urine and his current examination I am most suspicious that this is a central fever.  He does not have any evidence of active gout on examination at present.  I will be obtaining a amylase and lipase to ensure that he has not developed a pancreatitis related to his hypertriglyceridemia from the Cleviprex, and will be obtaining Dopplers of lower extremity to rule out DVTs as a potential source  of fever.  I have discussed with family that we frequently take measures to suppress fevers in patients with recent CNS insults.  Such measures potentially could escalate to sedation and intubation to prevent shivering.  They do not wish to escalate to that extent and they have specifically asked me not to intubate, not to ventilate and not to perform CPR.  Should fevers persist I will simply place him on empiric antibiotics and dose with steroids.  They are aware that there are potentially unfavorable side effects from the steroids but at this point they are most concerned with ensuring that he is comfortable as they are convinced that he would not wish to be supported only to survive with a substantial neurological deficit   NEUROLOGIC A: Right hemiplegia secondary to large left frontal intracranial hemorrhage  Greater than 32 minutes was spent in the evaluation of this patient today and in discussion with family of care plans   Lars Masson, MD [place] Pager: [phone]  06/06/2017, 2:28 PM

## 2017-06-06 NOTE — Progress Notes (Signed)
Preliminary results by tech - Venous Duplex Lower Ext. Completed. Negative for deep vein thrombosis in both legs.  Oda Cogan, BS, RDMS, RVT

## 2017-06-07 ENCOUNTER — Inpatient Hospital Stay (HOSPITAL_COMMUNITY): Payer: PPO

## 2017-06-07 DIAGNOSIS — Z4659 Encounter for fitting and adjustment of other gastrointestinal appliance and device: Secondary | ICD-10-CM

## 2017-06-07 DIAGNOSIS — R509 Fever, unspecified: Secondary | ICD-10-CM

## 2017-06-07 DIAGNOSIS — Z452 Encounter for adjustment and management of vascular access device: Secondary | ICD-10-CM

## 2017-06-07 LAB — CBC WITH DIFFERENTIAL/PLATELET
ABS IMMATURE GRANULOCYTES: 0.3 10*3/uL — AB (ref 0.0–0.1)
Basophils Absolute: 0 10*3/uL (ref 0.0–0.1)
Basophils Relative: 0 %
EOS PCT: 0 %
Eosinophils Absolute: 0 10*3/uL (ref 0.0–0.7)
HCT: 43.6 % (ref 39.0–52.0)
HEMOGLOBIN: 14 g/dL (ref 13.0–17.0)
Immature Granulocytes: 2 %
Lymphocytes Relative: 23 %
Lymphs Abs: 3.7 10*3/uL (ref 0.7–4.0)
MCH: 32.2 pg (ref 26.0–34.0)
MCHC: 32.1 g/dL (ref 30.0–36.0)
MCV: 100.2 fL — AB (ref 78.0–100.0)
MONO ABS: 0.7 10*3/uL (ref 0.1–1.0)
MONOS PCT: 4 %
NEUTROS ABS: 11 10*3/uL — AB (ref 1.7–7.7)
Neutrophils Relative %: 71 %
Platelets: 143 10*3/uL — ABNORMAL LOW (ref 150–400)
RBC: 4.35 MIL/uL (ref 4.22–5.81)
RDW: 13.2 % (ref 11.5–15.5)
WBC: 15.7 10*3/uL — ABNORMAL HIGH (ref 4.0–10.5)

## 2017-06-07 LAB — BASIC METABOLIC PANEL
ANION GAP: 9 (ref 5–15)
BUN: 49 mg/dL — AB (ref 6–20)
CALCIUM: 8.7 mg/dL — AB (ref 8.9–10.3)
CO2: 26 mmol/L (ref 22–32)
Chloride: 123 mmol/L — ABNORMAL HIGH (ref 101–111)
Creatinine, Ser: 1.45 mg/dL — ABNORMAL HIGH (ref 0.61–1.24)
GFR calc Af Amer: 52 mL/min — ABNORMAL LOW (ref >60)
GFR calc non Af Amer: 45 mL/min — ABNORMAL LOW (ref >60)
GLUCOSE: 235 mg/dL — AB (ref 65–99)
Potassium: 3.6 mmol/L (ref 3.5–5.1)
Sodium: 158 mmol/L — ABNORMAL HIGH (ref 135–145)

## 2017-06-07 LAB — GLUCOSE, CAPILLARY
GLUCOSE-CAPILLARY: 200 mg/dL — AB (ref 65–99)
GLUCOSE-CAPILLARY: 140 mg/dL — AB (ref 65–99)
Glucose-Capillary: 127 mg/dL — ABNORMAL HIGH (ref 65–99)
Glucose-Capillary: 145 mg/dL — ABNORMAL HIGH (ref 65–99)
Glucose-Capillary: 154 mg/dL — ABNORMAL HIGH (ref 65–99)
Glucose-Capillary: 187 mg/dL — ABNORMAL HIGH (ref 65–99)

## 2017-06-07 LAB — SODIUM
Sodium: 158 mmol/L — ABNORMAL HIGH (ref 135–145)
Sodium: 160 mmol/L — ABNORMAL HIGH (ref 135–145)
Sodium: 162 mmol/L (ref 135–145)

## 2017-06-07 LAB — TRIGLYCERIDES: TRIGLYCERIDES: 321 mg/dL — AB (ref <150)

## 2017-06-07 MED ORDER — JEVITY 1.2 CAL PO LIQD
1000.0000 mL | ORAL | Status: DC
  Administered 2017-06-07 – 2017-06-08 (×2): 1000 mL
  Administered 2017-06-09: 08:00:00
  Administered 2017-06-10 – 2017-06-13 (×4): 1000 mL
  Filled 2017-06-07 (×13): qty 1000

## 2017-06-07 MED ORDER — PRO-STAT SUGAR FREE PO LIQD
30.0000 mL | Freq: Every day | ORAL | Status: DC
  Administered 2017-06-08 – 2017-06-13 (×6): 30 mL
  Filled 2017-06-07 (×5): qty 30

## 2017-06-07 MED ORDER — LABETALOL HCL 5 MG/ML IV SOLN
10.0000 mg | INTRAVENOUS | Status: DC | PRN
  Administered 2017-06-07 (×2): 40 mg via INTRAVENOUS
  Administered 2017-06-07: 20 mg via INTRAVENOUS
  Administered 2017-06-08 (×2): 40 mg via INTRAVENOUS
  Administered 2017-06-08: 20 mg via INTRAVENOUS
  Filled 2017-06-07 (×3): qty 8
  Filled 2017-06-07: qty 4
  Filled 2017-06-07 (×2): qty 8

## 2017-06-07 MED ORDER — SENNOSIDES 8.8 MG/5ML PO SYRP
5.0000 mL | ORAL_SOLUTION | Freq: Two times a day (BID) | ORAL | Status: DC
Start: 2017-06-07 — End: 2017-06-13
  Administered 2017-06-07: 5 mL via ORAL
  Filled 2017-06-07 (×3): qty 5

## 2017-06-07 MED ORDER — FREE WATER
100.0000 mL | Status: DC
  Administered 2017-06-07 – 2017-06-13 (×37): 100 mL

## 2017-06-07 MED ORDER — INSULIN ASPART 100 UNIT/ML ~~LOC~~ SOLN: 0.0000 [IU] | SUBCUTANEOUS | Status: DC

## 2017-06-07 MED ORDER — INSULIN GLARGINE 100 UNIT/ML ~~LOC~~ SOLN
10.0000 [IU] | Freq: Every day | SUBCUTANEOUS | Status: DC
  Administered 2017-06-07 – 2017-06-12 (×6): 10 [IU] via SUBCUTANEOUS
  Filled 2017-06-07 (×7): qty 0.1

## 2017-06-07 MED ORDER — SODIUM CHLORIDE 0.45 % IV SOLN
INTRAVENOUS | Status: DC
  Administered 2017-06-07 – 2017-06-12 (×4): via INTRAVENOUS

## 2017-06-07 MED ORDER — FAMOTIDINE IN NACL 20-0.9 MG/50ML-% IV SOLN
20.0000 mg | INTRAVENOUS | Status: DC
  Administered 2017-06-07 – 2017-06-08 (×2): 20 mg via INTRAVENOUS
  Filled 2017-06-07 (×2): qty 50

## 2017-06-07 MED ORDER — INSULIN ASPART 100 UNIT/ML ~~LOC~~ SOLN
0.0000 [IU] | SUBCUTANEOUS | Status: DC
  Administered 2017-06-07 (×3): 1 [IU] via SUBCUTANEOUS
  Administered 2017-06-07 – 2017-06-08 (×3): 2 [IU] via SUBCUTANEOUS
  Administered 2017-06-08: 1 [IU] via SUBCUTANEOUS
  Administered 2017-06-08 (×2): 2 [IU] via SUBCUTANEOUS
  Administered 2017-06-08: 1 [IU] via SUBCUTANEOUS
  Administered 2017-06-08: 2 [IU] via SUBCUTANEOUS
  Administered 2017-06-09 – 2017-06-13 (×20): 1 [IU] via SUBCUTANEOUS

## 2017-06-07 NOTE — Assessment & Plan Note (Signed)
77 y.o. without any B symptoms with mild lymphocytic leukocytosis, and no anemia or thrombocytopenia.  Examination is also negative for palpable splenomegaly or lymphadenopathy.  Referral blood flow cytometry confirms presence of monoclonal lymphocyte population which comprises less than 5000 cells per microliter.  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  Additional studies are necessary to evaluate for possible presence of lymphoma component of the disease.  Plan: -Additional lab work today to evaluate cytogenetics and molecular profile of the disease. - CT of the neck/chest/abdomen/pelvis to assess for pathological lymphadenopathy. -Return to clinic in 3 months to review the findings and monitor for disease progression.

## 2017-06-07 NOTE — Progress Notes (Signed)
SLP Cancellation Note  Patient Details Name: CHIN WACHTER MRN: [id number] DOB: 25-May-1940   Cancelled treatment:       Reason Eval/Treat Not Completed: Other (comment). Pt more arousable today, but vomiting. Will f/u as appropriate.    Keyona Emrich, Katherene Ponto 06/07/2017, 1:40 PM

## 2017-06-07 NOTE — Progress Notes (Signed)
Patient will not be wearing the CPAP tonight as he has a feeding tube in his nose.

## 2017-06-07 NOTE — Progress Notes (Signed)
Nutrition Follow-up  INTERVENTION:   Increase Jevity 1.2 to 65 ml/hr (1560 ml/day) via Cortrak  Decrease Prostat to 30 ml daily   Provides: 1972 kcal, 101 grams protein, and 1265 ml free water.  Total free water: 1865 ml    NUTRITION DIAGNOSIS:   Inadequate oral intake related to dysphagia as evidenced by NPO status. 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    Pt discussed during ICU rounds and with RN.  Spoke with family at bedside.  Pt with mitten on L hand as he has started to move that arm and concern for dislodging tube.   Medications reviewed and include: lantus, SSI, senokot-s 100 ml free water every 4 hours = 600 ml Labs reviewed: Na 162 (H), TG: 321 (H)  Now off 3%    Diet Order:   Diet Order           Diet NPO time specified  Diet effective now          EDUCATION NEEDS:   No education needs have been identified at this time  Skin:  Skin Assessment: Reviewed RN Assessment  Last BM:  5/24 large, type 7  Height:   Ht Readings from Last 1 Encounters:  05/26/2017 5\' 10"  (1.778 m)    Weight:   Wt Readings from Last 1 Encounters:  06/07/17 187 lb 2.7 oz (84.9 kg)    Ideal Body Weight:  75.4 kg  BMI:  Body mass index is 26.86 kg/m.  Estimated Nutritional Needs:   Kcal:  1950-2200  Protein:  100-115 grams  Fluid:  >2L/day  Maylon Peppers RD, LDN, CNSC [phone] Pager [phone] After Hours Pager

## 2017-06-07 NOTE — Progress Notes (Signed)
STROKE TEAM PROGRESS NOTE   SUBJECTIVE (INTERVAL HISTORY) His wife and son are at the bedside. Jason Callahan is on vanco and zosyn and hydrocortisone since yesterday. And tier 1 TTM. Temp controlled well Tmax 100 today. More awake alert than yesterday but still global aphasic, moving left UE and LE spontaneously and purposeful. On TF and NS. Na 158.   OBJECTIVE Temp:  [98.7 F (37.1 C)-101 F (38.3 C)] 99.3 F (37.4 C) (05/24 1000) Pulse Rate:  [53-101] 78 (05/24 1015) Cardiac Rhythm: Sinus tachycardia (05/24 0800) Resp:  [15-30] 25 (05/24 1015) BP: (86-164)/(12-98) 145/74 (05/24 1015) SpO2:  [87 %-100 %] 97 % (05/24 1015) Weight:  [187 lb 2.7 oz (84.9 kg)] 187 lb 2.7 oz (84.9 kg) (05/24 0500)  Recent Labs  Lab 06/06/17 1526 06/06/17 2053 06/07/17 0009 06/07/17 0428 06/07/17 0959  GLUCAP 132* 195* 154* 200* 187*   Recent Labs  Lab 06/04/17 0430  06/05/17 0546 06/05/17 1235 06/05/17 1700  06/06/17 0534 06/06/17 1146 06/06/17 1512 06/06/17 1830 06/07/17 0415  NA 153*   < > 158* 158* 157*   < > 159*  160* 154* 160* 157* 158*  K 3.6  --  3.2*  --   --   --  3.2*  --  3.9  --  3.6  CL 120*  --  120*  --   --   --  123*  --  124*  --  123*  CO2 28  --  28  --   --   --  28  --  28  --  26  GLUCOSE 174*  --  148*  --   --   --  164*  --  159*  --  235*  BUN 18  --  19  --   --   --  35*  --  45*  --  49*  CREATININE 1.15  --  1.19  --   --   --  1.27*  --  1.40*  --  1.45*  CALCIUM 8.8*  --  9.0  --   --   --  9.2  --  9.0  --  8.7*  MG  --   --   --  2.3 2.3  --  2.2  --  2.3  --   --   PHOS  --   --   --  2.6 1.7*  --  2.8  --  3.2  --   --    < > = values in this interval not displayed.   Recent Labs  Lab 05/16/2017 1755 06/06/17 1512  AST 25 31  ALT 26 20  ALKPHOS 75 56  BILITOT 1.2 1.5*  PROT 6.5 6.1*  ALBUMIN 4.2 3.5   Recent Labs  Lab 06/07/2017 1755  06/03/17 0722 06/04/17 0430 06/05/17 0546 06/06/17 0534 06/07/17 0415  WBC 12.9*  --  18.3* 19.5* 18.8* 18.8*  15.7*  NEUTROABS 7.0  --   --   --  12.9* 11.9* 11.0*  HGB 14.7   < > 14.4 13.4 14.9 15.6 14.0  HCT 42.9   < > 42.1 40.7 45.7 48.3 43.6  MCV 94.3  --  96.6 98.5 100.0 101.7* 100.2*  PLT 172  --  190 172 183 163 143*   < > = values in this interval not displayed.   No results for input(s): CKTOTAL, CKMB, CKMBINDEX, TROPONINI in the last 168 hours. No results for input(s): LABPROT, INR in the last 72 hours. Recent  Labs    06/04/17 1200  COLORURINE YELLOW  LABSPEC 1.013  PHURINE 5.0  GLUCOSEU NEGATIVE  HGBUR NEGATIVE  BILIRUBINUR NEGATIVE  KETONESUR NEGATIVE  PROTEINUR NEGATIVE  NITRITE NEGATIVE  LEUKOCYTESUR NEGATIVE       Component Value Date/Time   CHOL 111 06/03/2017 0722   TRIG 321 (H) 06/07/2017 0737   HDL 46 06/03/2017 0722   CHOLHDL 2.4 06/03/2017 0722   VLDL 23 06/03/2017 0722   LDLCALC 42 06/03/2017 0722   Lab Results  Component Value Date   HGBA1C 5.3 05/31/2017   No results found for: LABOPIA, COCAINSCRNUR, LABBENZ, AMPHETMU, THCU, LABBARB  No results for input(s): ETH in the last 168 hours.  I have personally reviewed the radiological images below and agree with the radiology interpretations.  Ct Angio Head W Or Wo Contrast  Result Date: 05/28/2017 CLINICAL DATA:  Intracranial hemorrhage EXAM: CT ANGIOGRAPHY HEAD AND NECK TECHNIQUE: Multidetector CT imaging of the head and neck was performed using the standard protocol during bolus administration of intravenous contrast. Multiplanar CT image reconstructions and MIPs were obtained to evaluate the vascular anatomy. Carotid stenosis measurements (when applicable) are obtained utilizing NASCET criteria, using the distal internal carotid diameter as the denominator. CONTRAST:  36mL ISOVUE-370 IOPAMIDOL (ISOVUE-370) INJECTION 76% COMPARISON:  CT head immediately preceding FINDINGS: CTA NECK FINDINGS Aortic arch: Mild atherosclerotic disease aortic arch. Proximal great vessels widely patent Right carotid system: Right  common carotid artery widely patent. Right carotid artery endarterectomy widely patent Left carotid system: Left common carotid artery widely patent. Mild atherosclerotic disease. 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 Left external carotid artery widely patent. Vertebral arteries: Both vertebral arteries are widely patent without stenosis. Skeleton: No acute abnormality. Cervical spine degenerative changes. Other neck: Negative for mass or adenopathy. Upper chest: Negative Review of the MIP images confirms the above findings CTA HEAD FINDINGS Anterior circulation: Atherosclerotic calcification right cavernous carotid with mild stenosis. Right anterior and middle cerebral arteries patent without significant stenosis. 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 Left anterior and middle cerebral arteries patent without stenosis. Posterior circulation: Both vertebral arteries patent to the basilar. Basilar widely patent. PICA, superior cerebellar, posterior cerebral arteries patent without stenosis. Venous sinuses: Patent Anatomic variants: None Delayed phase: Large hematoma left frontal lobe unchanged from preceding CT. Local mass-effect and 4 mm midline shift to the right is unchanged. No enhancing mass lesion identified. Review of the MIP images confirms the above findings IMPRESSION: 1. Large left frontal hematoma. No evidence of underlying vascular malformation or tumor 2. Right carotid endarterectomy widely patent 3. 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 Left cavernous carotid is patent but severely stenotic. Electronically Signed   By: Franchot Gallo M.D.   On: 05/17/2017 18:51   Ct Head Wo Contrast  Result Date: 06/04/2017 CLINICAL DATA:  Intracranial hemorrhage follow-up EXAM:  CT HEAD WITHOUT CONTRAST TECHNIQUE: Contiguous axial images were obtained from the base of the skull through the vertex without intravenous contrast. COMPARISON:  06/03/2017 FINDINGS: Brain: Large area of hemorrhage in the left frontal lobe is slightly smaller, likely due to decompression into the ventricles. Small amount of intraventricular hemorrhage is now noted. Subarachnoid hemorrhage on the left is unchanged in volume. No new area of hemorrhage. There is mass-effect and 6.5 mm midline shift to the right. Negative for hydrocephalus.  No other acute findings Vascular: Negative for hyperdense vessel Skull: Negative Sinuses/Orbits: Paranasal sinuses clear. Prior ocular surgery on the right. Other: None IMPRESSION: Large intraparenchymal hemorrhage in the left frontal lobe slightly smaller, likely due to decompression the ventricles. Small amount of ventricular hemorrhage noted. Subarachnoid hemorrhage on the left is unchanged 6.5 mm midline shift towards the right is unchanged. Electronically Signed   By: Franchot Gallo M.D.   On: 06/04/2017 09:29   Ct Head Wo Contrast  Result Date: 06/03/2017 CLINICAL DATA:  Cerebral hemorrhage. EXAM: CT HEAD WITHOUT CONTRAST TECHNIQUE: Contiguous axial images were obtained from the base of the skull through the vertex without intravenous contrast. COMPARISON:  CT head without contrast 06/03/2017. CTA head and neck 05/30/2017. FINDINGS: Brain: Left frontal parenchymal hemorrhage has increased in size, now measuring 9.3 x 5.1 x 4.6 cm. Previous measurements were 7.0 x 4.7 x 3.8 cm in the same planes. There is increased mass effect on the left lateral ventricle. No intraventricular hemorrhage is evident. Surrounding edema is noted. Maximal midline shift is 12 mm, increased from 6 mm on the prior exam. There is no hydrocephalus. No downward herniation is present. Basal cisterns are intact. The brainstem and cerebellum are normal. No new cortical infarcts are present. No separate  hemorrhage is evident. Vascular: No hyperdense vessel or unexpected calcification. Skull: Calvarium is intact. No focal lytic or blastic lesions are present. No significant extracranial soft tissue injury is present. Sinuses/Orbits: Right scleral banding is again noted. Right lens replacement is noted. Orbits are otherwise unremarkable. The paranasal sinuses and mastoid air cells are clear. IMPRESSION: 1. Expanding left frontal hematoma with increased mass effect as described. 2. Increasing mass effect on the left lateral ventricle without intraventricular hemorrhage or downward herniation. Critical Value/emergent results were called by telephone at the time of interpretation on 06/03/2017 at 7:32 am to Dr. Lorraine Lax , who verbally acknowledged these results. Electronically Signed   By: San Morelle M.D.   On: 06/03/2017 07:32   Ct Soft Tissue Neck W Contrast  Result Date: 05/28/2017 CLINICAL DATA:  Monoclonal lymphocytosis.  Rule out adenopathy EXAM: CT NECK WITH CONTRAST TECHNIQUE: Multidetector CT imaging of the neck was performed using the standard protocol following the bolus administration of intravenous contrast. CONTRAST:  152mL ISOVUE-300 IOPAMIDOL (ISOVUE-300) INJECTION 61% COMPARISON:  None. FINDINGS: Pharynx and larynx: Normal. No mass or swelling. Salivary glands: No inflammation, mass, or stone. Thyroid: Negative Lymph nodes: No pathologic or enlarged lymph nodes in the neck. Vascular: Right carotid  endarterectomy is patent. Occluded left internal carotid artery at the origin. Jugular vein patent bilaterally. Limited intracranial: Negative Visualized orbits: Right ocular surgery.  No orbital mass. Mastoids and visualized paranasal sinuses: Negative Skeleton: Degenerative changes in the cervical spine. No acute skeletal lesion. Upper chest: Chest CT from today reported separately Other: None IMPRESSION: Negative for mass or adenopathy in the neck Occluded left internal carotid artery. Right  carotid endarterectomy is patent. Electronically Signed   By: Franchot Gallo M.D.   On: 05/28/2017 16:29   Ct Angio Neck W Or Wo Contrast  Result Date: 06/07/2017 CLINICAL DATA:  Intracranial hemorrhage EXAM: CT ANGIOGRAPHY HEAD AND NECK TECHNIQUE: Multidetector CT imaging of the head and neck was performed using the standard protocol during bolus administration of intravenous contrast. Multiplanar CT image reconstructions and MIPs were obtained to evaluate the vascular anatomy. Carotid stenosis measurements (when applicable) are obtained utilizing NASCET criteria, using the distal internal carotid diameter as the denominator. CONTRAST:  73mL ISOVUE-370 IOPAMIDOL (  ISOVUE-370) INJECTION 76% COMPARISON:  CT head immediately preceding FINDINGS: CTA NECK FINDINGS Aortic arch: Mild atherosclerotic disease aortic arch. Proximal great vessels widely patent Right carotid system: Right common carotid artery widely patent. Right carotid artery endarterectomy widely patent Left carotid system: Left common carotid artery widely patent. Mild atherosclerotic disease. 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 Left external carotid artery widely patent. Vertebral arteries: Both vertebral arteries are widely patent without stenosis. Skeleton: No acute abnormality. Cervical spine degenerative changes. Other neck: Negative for mass or adenopathy. Upper chest: Negative Review of the MIP images confirms the above findings CTA HEAD FINDINGS Anterior circulation: Atherosclerotic calcification right cavernous carotid with mild stenosis. Right anterior and middle cerebral arteries patent without significant stenosis. 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 Left anterior and middle cerebral arteries patent without stenosis. Posterior circulation: Both vertebral  arteries patent to the basilar. Basilar widely patent. PICA, superior cerebellar, posterior cerebral arteries patent without stenosis. Venous sinuses: Patent Anatomic variants: None Delayed phase: Large hematoma left frontal lobe unchanged from preceding CT. Local mass-effect and 4 mm midline shift to the right is unchanged. No enhancing mass lesion identified. Review of the MIP images confirms the above findings IMPRESSION: 1. Large left frontal hematoma. No evidence of underlying vascular malformation or tumor 2. Right carotid endarterectomy widely patent 3. 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 Left cavernous carotid is patent but severely stenotic. Electronically Signed   By: Franchot Gallo M.D.   On: 06/10/2017 18:51   Ct Chest W Contrast  Result Date: 05/29/2017 CLINICAL DATA:  77 year old male for initial staging of chronic lymphocytic leukemia. EXAM: CT CHEST, ABDOMEN, AND PELVIS WITH CONTRAST TECHNIQUE: Multidetector CT imaging of the chest, abdomen and pelvis was performed following the standard protocol during bolus administration of intravenous contrast. CONTRAST:  131mL ISOVUE-300 IOPAMIDOL (ISOVUE-300) INJECTION 61% COMPARISON:  01/28/2004 chest CT, 06/29/2009 abdominal and pelvic CT and prior studies FINDINGS: CT CHEST FINDINGS Cardiovascular: Heart size is normal. Multivessel coronary artery calcifications identified. Thoracic aortic atherosclerotic calcifications noted without aneurysm. No pericardial effusion. Mediastinum/Nodes: No enlarged mediastinal, hilar, or axillary lymph nodes. Thyroid gland, trachea, and esophagus demonstrate no significant findings. Lungs/Pleura: Minimal dependent atelectasis/interstitial prominence is unchanged. Minimal LEFT basilar scarring again noted. No airspace disease, consolidation, mass, nodule, pleural effusion or pneumothorax noted. Musculoskeletal: No chest wall mass or suspicious bone lesions  identified. CT ABDOMEN PELVIS FINDINGS Hepatobiliary: The liver and gallbladder are unremarkable. No biliary dilatation. Pancreas: Unremarkable Spleen: Unremarkable Adrenals/Urinary Tract: Kidneys, adrenal glands and bladder are unremarkable except for nonobstructing RIGHT renal calculi. The largest RIGHT renal calculus measures 10 x 16 mm within the mid RIGHT kidney. Stomach/Bowel: Stomach is within normal limits. No evidence of bowel wall thickening, distention, or inflammatory changes. Vascular/Lymphatic: Aortic atherosclerosis. No enlarged abdominal or pelvic lymph nodes. Reproductive: Moderate to marked prostate enlargement noted. Other: No ascites, focal collection or pneumoperitoneum. Musculoskeletal: No acute or suspicious bony abnormalities. Degenerative changes within the lumbar spine again noted. Wedge compression fracture of L1 is unchanged. IMPRESSION: 1. No evidence of lymphadenopathy, splenomegaly or metastatic disease. 2. No acute abnormalities. 3. Nonobstructing RIGHT renal calculi 4. Moderate to marked prostate enlargement 5. Coronary artery and Aortic Atherosclerosis (ICD10-I70.0). Electronically Signed   By: Margarette Canada M.D.   On: 05/29/2017 08:57   [name] FA Contrast  Result Date: 06/03/2017 CLINICAL DATA:  Cerebral hemorrhage. EXAM: MRI HEAD WITHOUT AND WITH CONTRAST TECHNIQUE: Multiplanar, multiecho pulse sequences of the brain and surrounding structures were obtained without and with intravenous contrast. CONTRAST:  55mL MULTIHANCE GADOBENATE DIMEGLUMINE 529 MG/ML IV SOLN COMPARISON:  Head CT 06/03/2017 at 0609 hours FINDINGS: Brain: 9.6 x 5.2 x 4.7 cm left frontal lobe hematoma has not significantly changed in size. There is mild surrounding edema with unchanged mass effect on the left lateral ventricle. 12 mm of rightward midline shift is unchanged. A small amount of adjacent left frontal subarachnoid hemorrhage is again seen. There is trace hemorrhage in the occipital horn of the  right lateral ventricle. No acute infarct is evident separate from the left frontal hematoma. No underlying enhancing mass lesion is evident. No chronic brain hemorrhages are identified. There is no extra-axial fluid collection. Mild cerebral atrophy is within normal limits for age. A few punctate foci of cerebral white matter T2 hyperintensity are also within normal limits for age. Vascular: Major intracranial vascular flow voids are preserved. Skull and upper cervical spine: Unremarkable bone marrow signal. Sinuses/Orbits: Right cataract extraction and scleral banding. No significant sinus disease or significant mastoid fluid. Other: None. IMPRESSION: 1. Large left frontal lobe hematoma, unchanged from today's earlier CT. Unchanged mass effect with 12 mm of midline shift. No evidence of underlying mass. No chronic hemorrhages to suggest cerebral amyloid angiopathy. 2. Small volume adjacent subarachnoid hemorrhage and trace intraventricular hemorrhage. No evidence of hydrocephalus. Electronically Signed   By: Logan Bores M.D.   On: 06/03/2017 16:43   Ct Abdomen Pelvis W Contrast  Result Date: 05/29/2017 CLINICAL DATA:  77 year old male for initial staging of chronic lymphocytic leukemia. EXAM: CT CHEST, ABDOMEN, AND PELVIS WITH CONTRAST TECHNIQUE: Multidetector CT imaging of the chest, abdomen and pelvis was performed following the standard protocol during bolus administration of intravenous contrast. CONTRAST:  147mL ISOVUE-300 IOPAMIDOL (ISOVUE-300) INJECTION 61% COMPARISON:  01/28/2004 chest CT, 06/29/2009 abdominal and pelvic CT and prior studies FINDINGS: CT CHEST FINDINGS Cardiovascular: Heart size is normal. Multivessel coronary artery calcifications identified. Thoracic aortic atherosclerotic calcifications noted without aneurysm. No pericardial effusion. Mediastinum/Nodes: No enlarged mediastinal, hilar, or axillary lymph nodes. Thyroid gland, trachea, and esophagus demonstrate no significant  findings. Lungs/Pleura: Minimal dependent atelectasis/interstitial prominence is unchanged. Minimal LEFT basilar scarring again noted. No airspace disease, consolidation, mass, nodule, pleural effusion or pneumothorax noted. Musculoskeletal: No chest wall mass or suspicious bone lesions identified. CT ABDOMEN PELVIS FINDINGS Hepatobiliary: The liver and gallbladder are unremarkable. No biliary dilatation. Pancreas: Unremarkable Spleen: Unremarkable Adrenals/Urinary Tract: Kidneys, adrenal glands and bladder are unremarkable except for nonobstructing RIGHT renal calculi. The largest RIGHT renal calculus measures 10 x 16 mm within the mid RIGHT kidney. Stomach/Bowel: Stomach is within normal limits. No evidence of bowel wall thickening, distention, or inflammatory changes. Vascular/Lymphatic: Aortic atherosclerosis. No enlarged abdominal or pelvic lymph nodes. Reproductive: Moderate to marked prostate enlargement noted. Other: No ascites, focal collection or pneumoperitoneum. Musculoskeletal: No acute or suspicious bony abnormalities. Degenerative changes within the lumbar spine again noted. Wedge compression fracture of L1 is unchanged. IMPRESSION: 1. No evidence of lymphadenopathy, splenomegaly or metastatic disease. 2. No acute abnormalities. 3. Nonobstructing RIGHT renal calculi 4. Moderate to marked prostate enlargement 5. Coronary artery and Aortic Atherosclerosis (ICD10-I70.0). Electronically Signed   By: Margarette Canada M.D.   On: 05/29/2017 08:57   Dg Chest Port 1 View  Result Date: 06/03/2017 CLINICAL DATA:  Central line placement. EXAM: PORTABLE CHEST 1 VIEW COMPARISON:  January 29, 2004 FINDINGS: There is a new right central line terminating in the central SVC. No pneumothorax. The heart size borderline. The hila and mediastinum are unremarkable given technique. No overt edema or suspicious infiltrate. Mild atelectasis in the right base. IMPRESSION: The new right central line is in good position.  No  pneumothorax. Electronically Signed   By: Dorise Bullion III M.D   On: 06/03/2017 12:11   Ct Head Code Stroke Wo Contrast  Result Date: 06/05/2017 CLINICAL DATA:  Code stroke. Altered level of consciousness. Aphasia EXAM: CT HEAD WITHOUT CONTRAST TECHNIQUE: Contiguous axial images were obtained from the base of the skull through the vertex without intravenous contrast. COMPARISON:  None. FINDINGS: Brain: Large area of high-density hemorrhage in the left frontal lobe with mixed densities. This measures 6.4 x 3.9 x 5.0 cm corresponding to 61 mL of blood. Surrounding edema. There is mass-effect and 4 mm midline shift to the right. No subarachnoid or subdural hemorrhage Mild atrophy.  No other areas of hemorrhage or acute infarct Vascular: Normal arterial density. Skull: Negative Sinuses/Orbits: Paranasal sinuses show mild mucosal edema. Right cataract surgery Other: None ASPECTS ([place] Stroke Program Early CT Score) - Ganglionic level infarction (caudate, lentiform nuclei, internal capsule, insula, M1-M3 cortex): 7 - Supraganglionic infarction (M4-M6 cortex): 3 Total score (0-10 with 10 being normal): 10 IMPRESSION: 1. Large hemorrhage in the left frontal lobe, 61 mL. Differential diagnostic considerations include amyloid, hypertension, vascular malformation. Hemorrhagic stroke not considered likely with acute onset of symptoms. 2. ASPECTS is 10 3. These results were called by telephone at the time of interpretation on 06/03/2017 at 6:16 pm to Dr. Rory Percy , who verbally acknowledged these results. Electronically Signed   By: Franchot Gallo M.D.   On: 05/30/2017 18:17   Dg Chest Port 1 View  Result Date: 06/04/2017 CLINICAL DATA:  Fever. EXAM: PORTABLE CHEST 1 VIEW COMPARISON:  Chest x-ray from yesterday. FINDINGS: Unchanged right subclavian central venous catheter. Stable cardiomediastinal silhouette. Stable mild pulmonary vascular congestion. Minimal bibasilar atelectasis. No focal consolidation, pleural  effusion, or pneumothorax. No acute osseous abnormality. Old left-sided rib fractures. IMPRESSION: 1. Mild pulmonary vascular congestion without overt edema, unchanged. Electronically Signed   By: Titus Dubin M.D.   On: 06/04/2017 11:39   TTE - Left ventricle: The cavity size was normal. Wall thickness was   increased in a pattern of mild LVH. Systolic function was   vigorous. The estimated ejection fraction was in the range of 65%   to 70%. Wall motion was normal; there were no regional wall   motion abnormalities. - Aortic valve: Trileaflet; mildly thickened, moderately calcified   leaflets. Moderate calcification involving the noncoronary cusp. - Pulmonary arteries: Systolic pressure was moderately increased.   PA peak pressure: 51 mm Hg (S).  Ct Head Wo Contrast  Result Date: 06/06/2017 CLINICAL DATA:  Follow-up intracranial hemorrhage. EXAM: CT HEAD WITHOUT CONTRAST TECHNIQUE: Contiguous axial images were obtained from the base of the skull through the vertex without intravenous contrast. COMPARISON:  CT HEAD Jun 04, 2017 and priors. FINDINGS: BRAIN: Evolving LEFT frontal lobe hematoma with similar surrounding low-density vasogenic edema and minimal satellite hemorrhages. Intraventricular extension with layering blood products in occipital horns. No hydrocephalus. Small volume subarachnoid hemorrhage. 7 mm LEFT-to-RIGHT midline shift. No ventricular entrapment. No acute large vascular territory infarcts. Basal cisterns are patent. VASCULAR: Moderate calcific atherosclerosis of the carotid siphons. SKULL: No skull fracture. No significant scalp soft tissue swelling. SINUSES/ORBITS: LEFT nasogastric tube. Trace mastoid effusions and paranasal  sinus mucosal thickening. The included ocular globes and orbital contents are non-suspicious. Status post RIGHT scleral banding in RIGHT ocular lens implant. OTHER: None. IMPRESSION: 1. Evolving LEFT frontal lobe hematoma with similar intraventricular  extension. 2. 7 mm LEFT-to-RIGHT midline shift. No ventricular entrapment or hydrocephalus. 3. Similar small volume subarachnoid hemorrhage. Electronically Signed   By: Elon Alas M.D.   On: 06/06/2017 04:06   Dg Chest Port 1 View  Result Date: 06/05/2017 CLINICAL DATA:  Short of breath and wheezing EXAM: PORTABLE CHEST 1 VIEW COMPARISON:  06/04/2017, 06/03/2017 CT chest 05/28/2017 FINDINGS: Placement of an esophageal to, the tip is in the left upper quadrant. Low lung volumes. Probable tiny pleural effusions. Increasing atelectasis at both bases. Stable cardiomediastinal silhouette with vascular congestion. No pneumothorax. Right-sided central venous catheter tip is obscured by overlying telemetry leads. Old left-sided rib fractures IMPRESSION: 1. Low lung volumes with suspected tiny right effusion. Increasing atelectasis at both lung bases 2. Stable vascular congestion 3. Esophageal tube tip is in the left upper quadrant Electronically Signed   By: Donavan Foil M.D.   On: 06/05/2017 23:18   Dg Chest Port 1 View  Result Date: 06/07/2017 CLINICAL DATA:  Hypoxia EXAM: PORTABLE CHEST 1 VIEW COMPARISON:  06/05/2017 FINDINGS: Cardiac shadow is stable. Right subclavian central line is again seen in stable position. Bibasilar atelectasis is noted stable from the prior study. Mild vascular congestion is seen. Feeding catheter extends into the stomach. IMPRESSION: No active disease. Electronically Signed   By: Inez Catalina M.D.   On: 06/07/2017 07:24    PHYSICAL EXAM  Temp:  [98.7 F (37.1 C)-101 F (38.3 C)] 99.3 F (37.4 C) (05/24 1000) Pulse Rate:  [53-101] 78 (05/24 1015) Resp:  [15-30] 25 (05/24 1015) BP: (86-164)/(12-98) 145/74 (05/24 1015) SpO2:  [87 %-100 %] 97 % (05/24 1015) Weight:  [187 lb 2.7 oz (84.9 kg)] 187 lb 2.7 oz (84.9 kg) (05/24 0500)  General - Well nourished, well developed, lethargic.  Ophthalmologic - fundi not visualized due to noncooperation.  Cardiovascular -  Regular rate and rhythm.  Neuro - lethargic but more awake alert than yesterday, easily open eyes on voice. Able to track objects on the left but not on the right. Global aphasia, not following commands. Left pupil round 2.80mm, right surgical pupil dilated with irregular sharp, left gaze preference, not cross midline. Blinking to visual threat on the left but not on the right. Right facial droop and tongue midline in mouth. LUE spontaneous movement against gravity able to hold up on exam. LLE purposeful movement, able to against gravity. RUE flaccid and RLE 1/5 withdraw on pain stimulation. DTR 1+ and right babinski positive. Sensation, coordination and gait not tested.   ASSESSMENT/PLAN Jason Callahan is a 77 y.o. 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 No tPA given due to [place].    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   Ongoing aggressive stroke risk factor management  Therapy recommendations:  CIR  Disposition:  pending  Cerebral edema  CT showed left frontal ICH with mass effect  off 3% and now on NS  Na Q6h, goal 150-155  Na 141->144->153->158->159->158  CT repeat 06/06/17 stable hematoma and midline shift  May consider CT repeat Sunday or Monday but stat CT if any neuro changes.  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  Leukocytosis following with oncology  WBC 18.3->19.5->18.8->18.8->15.7  Pan CT negative so far  Continue outpt follow up with oncology  Hypertension and tachycardia Stable  BP goal < 180 - relax BP goal today  On/off cleviprex  on losartan 100mg  daily, amlodipine 10, and metoprolol 50 bid  Elevated TG - relaxed BP goal today trying to be off cleviprex  Close monitoring  Hyperlipidemia  Home meds:  crestor   LDL 42, goal < 70  Hold off statin for now due to acute ICH  Dysphagia  Cortrak placed  On TF @ 25  Other Stroke Risk Factors  Advanced age  PVD  Obstructive sleep apnea, on CPAP at home  Other Active Problems  Hyperglycemia  Hypokalemia - K 3.2->3.2->3.6 - supplement  Hospital day # 5  This patient is critically ill due to large right ICH, cerebral edema, HTN, carotid occlusion, fever and at significant risk of neurological worsening, death form cerebral edema, brain herniation, heart failure, aspiration pneumonia. This patient's care requires constant monitoring of vital signs, hemodynamics, respiratory and cardiac monitoring, review of multiple databases, neurological assessment, discussion with family, other specialists and medical decision making of high complexity. I spent 40 minutes of neurocritical care time in the care of this patient. I had long discussion with wife and son at bedside, updated Jason Callahan current condition, treatment plan and potential prognosis. They expressed understanding and appreciation. I also discussed with Dr. Dominica Severin.    Rosalin Hawking, MD PhD Stroke Neurology 06/07/2017 10:40 AM    To contact Stroke Continuity provider, please  refer to [email or web address] After hours, contact General Neurology

## 2017-06-07 NOTE — Progress Notes (Signed)
Physical Therapy Treatment Patient Details Name: Jason Callahan MRN: [id number] DOB: 1941/01/08 Today's Date: 06/07/2017    History of Present Illness This 77 y.o. male admitted with acute onset Rt sided weakness, Rt facial droop, and aphasia.   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  PMH includes:  PVD, LVH, Gout. s/p CEA.      PT Comments    Pt became more alert this afternoon and was able to participate at EOB and standing with the assist of the pt's family.    Follow Up Recommendations  CIR;Supervision/Assistance - 24 hour     Equipment Recommendations  Other (comment)    Recommendations for Other Services Rehab consult     Precautions / Restrictions Precautions Precautions: Fall    Mobility  Bed Mobility Overal bed mobility: Needs Assistance Bed Mobility: Rolling;Sidelying to Sit;Sit to Supine Rolling: Mod assist Sidelying to sit: Max assist;+2 for safety/equipment   Sit to supine: Max assist   General bed mobility comments: Family present to assist with lines.  cues for sequence, truncal assist and keeping L UE from overpowering his right side.  Transfers Overall transfer level: Needs assistance   Transfers: Sit to/from Stand;Lateral/Scoot Transfers Sit to Stand: Max assist;+2 safety/equipment        Lateral/Scoot Transfers: Max assist;+2 safety/equipment General transfer comment: pt required facilitation for anterior translation of trunk as well as facilitation at ischium and trunk for extension   Ambulation/Gait             General Gait Details: NT   Stairs             Wheelchair Mobility    Modified Rankin (Stroke Patients Only) Modified Rankin (Stroke Patients Only) Modified Rankin: Severe disability     Balance     Sitting balance-Leahy Scale: Poor Sitting balance - Comments: mod assist for stability.     Standing balance-Leahy Scale: Poor Standing balance comment: sit to stand x3  with face to face assist, control at right knee                            Cognition Arousal/Alertness: Lethargic;Awake/alert Behavior During Therapy: Flat affect;Restless Overall Cognitive Status: Impaired/Different from baseline Area of Impairment: Attention                   Current Attention Level: Sustained           General Comments: more alert during the session and more aware.      Exercises      General Comments General comments (skin integrity, edema, etc.): vss.  on return to supine, pt either vomited up bile.  pt was quickly rolled to his side with bile mopped up and suctioned.  RN called to assisted.  pt's family around to assist until the RN took over.  Assisted with clean up and bed change.      Pertinent Vitals/Pain Pain Assessment: Faces Faces Pain Scale: No hurt    Home Living                      Prior Function            PT Goals (current goals can now be found in the care plan section) Acute Rehab PT Goals Patient Stated Goal: for pt to regain as much function as possible  PT Goal Formulation: With patient Time For Goal Achievement: 06/18/17 Potential to  Achieve Goals: Good Progress towards PT goals: Progressing toward goals    Frequency    Min 4X/week      PT Plan Current plan remains appropriate    Co-evaluation              AM-PAC PT "6 Clicks" Daily Activity  Outcome Measure  Difficulty turning over in bed (including adjusting bedclothes, sheets and blankets)?: Unable Difficulty moving from lying on back to sitting on the side of the bed? : Unable Difficulty sitting down on and standing up from a chair with arms (e.g., wheelchair, bedside commode, etc,.)?: Unable Help needed moving to and from a bed to chair (including a wheelchair)?: A Lot Help needed walking in hospital room?: Total Help needed climbing 3-5 steps with a railing? 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

## 2017-06-07 NOTE — Progress Notes (Signed)
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 without any B symptoms with mild lymphocytic leukocytosis, and no anemia or thrombocytopenia.  Examination is also negative for palpable splenomegaly or lymphadenopathy.  Referral blood flow cytometry confirms presence of monoclonal lymphocyte population which comprises less than 5000 cells per microliter.  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  Additional studies are necessary to evaluate for possible presence of lymphoma component of the disease.  Plan: -Additional lab work today to evaluate cytogenetics and molecular profile of the disease. - CT of the neck/chest/abdomen/pelvis to assess for pathological lymphadenopathy. -Return to clinic in 3 months to review the findings and monitor for disease progression.   Voice recognition software was used and creation of this note. Despite my best effort at editing the text, some misspelling/errors may have occurred.  Orders Placed This Encounter  Procedures  . CT Soft Tissue Neck W Contrast    Standing Status:   Future    Number of Occurrences:   1    Standing Expiration Date:   05/21/2018    Order Specific Question:   If indicated for the ordered procedure, I authorize the administration of contrast media per Radiology protocol    Answer:   Yes    Order Specific Question:   Preferred imaging location?    Answer:   [facility]    Order Specific Question:   Radiology Contrast Protocol - do NOT remove file path    Answer:   _0 [email or web address]    Order Specific Question:   Reason for Exam additional comments    Answer:   Monoclonal lymphocytosis, please eval for pathological lymphadenopathy/splenomegaly  . CT Chest  W Contrast    Standing Status:   Future    Number of Occurrences:   1    Standing Expiration Date:   05/21/2018    Order Specific Question:   If indicated for the ordered procedure, I authorize the administration of contrast media per Radiology protocol    Answer:   Yes    Order Specific Question:   Preferred imaging location?    Answer:   [facility]    Order Specific Question:   Radiology Contrast Protocol - do NOT remove file path    Answer:   _1 [email or web address]  . CT Abdomen Pelvis W Contrast    Standing Status:   Future    Number of Occurrences:   1    Standing Expiration Date:   05/21/2018    Order Specific Question:   If indicated for the ordered procedure, I authorize the administration of contrast media per Radiology protocol    Answer:   Yes    Order Specific Question:   Preferred imaging location?    Answer:   [facility]    Order Specific Question:   Is Oral Contrast requested for this exam?    Answer:   Yes, Per Radiology protocol    Order Specific Question:   Radiology Contrast Protocol - do NOT remove file path    Answer:   _2 [email or web address]    Order Specific Question:   Reason for Exam additional comments    Answer:   Possible SLL evaluation    Cancer Staging No matching staging information was found for the patient.  All questions were answered.  . The patient knows to call  the clinic with any problems, questions or concerns.  This note was electronically signed.    History of Presenting Illness Jason Callahan is a 77 y.o. male followed in the [place] for lymphocytosis, referred by Dr Crist Infante.  Patient's past medical history is significant for hypertension, gout, history of nephrolithiasis, peripheral vascular disease, diverticulitis history, CKD, GERD.  Patient is reported to have lymphocytosis since 2017, but only to most recent lab work values have been submitted with the patient.   Patient denies any family history of hematological malignancy or solid tumors.  Vision is a non-smoker and does not drink alcohol to excess.  At the present time, patient reports feeling well with no active symptoms.  He has been losing weight intentionally, denies any changes in appetite.  No nausea, vomiting, abdominal pain, early satiety, diarrhea, or constipation.  No swelling in the neck, armpits, or groin.  No respiratory, genitourinary, or neurological complaints.  Oncological/hematological History: --Labs, 03/26/17: WBC 12.9, ANC 5.0, ALC 6.7, Mono 0.7, Hgb 15.6, Plt 176  --Labs, 04/22/17: WBC 11.7, ANC 5.0, ALC 5.9, Mono 0.6, Hgb 14.4, Plt 189;  --Labs, 05/14/17: WBC 10.4, ANC 4.7, ALC 4.9, Mono 0.6, Hgb 14.7, Plt 163; LDH 146, beta-2 microglobulin 1.8, IgG 821 --FlowCyto, 05/14/17: Positive for monoclonal population of lymphocytes comprising 65% of total, positive for CD20, CD23, and CD5 and negative for CD10.   No history exists.    Medical History: Past Medical History:  Diagnosis Date  . 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 Diverticulitis   . GERD (gastroesophageal reflux disease)   . Gout   . Hernia, inguinal, left   . Hypertension   . LVH (left ventricular hypertrophy)   . Nephrolithiasis   . Osteopenia   . PVD (peripheral vascular disease) ([place])   . Snoring 02/10/2014    Surgical History: Past Surgical History:  Procedure Laterality Date  . CEA  2000  . HERNIA REPAIR Left     Family History: Family History  Problem Relation Age of Onset  . Heart disease Father   . Arthritis Mother     Social History: Social History   Socioeconomic History  . Marital status: Married    Spouse name: [name]  . Number of children: 2  . Years of education: college gr  . Highest education level: Not on file  Occupational History  . Occupation: retired  Scientific laboratory technician  . Financial resource strain: Not on file  . Food insecurity:    Worry: Not on file    Inability:  Not on file  . Transportation needs:    Medical: Not on file    Non-medical: Not on file  Tobacco Use  . Smoking status: Never Smoker  . Smokeless tobacco: Never Used  Substance and Sexual Activity  . Alcohol use: Yes    Alcohol/week: 4.2 oz    Types: 7 Standard drinks or equivalent per week    Comment: Occassional  . Drug use: No  . Sexual activity: Not on file  Lifestyle  . Physical activity:    Days per week: Not on file    Minutes per session: Not on file  . Stress: Not on file  Relationships  . Social connections:    Talks on phone: Not on file    Gets together: Not on file    Attends religious service: Not on file    Active member of club or organization: Not on file    Attends meetings of clubs or organizations: Not  on file    Relationship status: Not on file  . Intimate partner violence:    Fear of current or ex partner: Not on file    Emotionally abused: Not on file    Physically abused: Not on file    Forced sexual activity: Not on file  Other Topics Concern  . Not on file  Social History Narrative   Denies caffeine use.    Allergies: No Known Allergies  Medications:  No current facility-administered medications for this visit.    No current outpatient medications on file.   Facility-Administered Medications Ordered in Other Visits  Medication Dose Route Frequency Provider Last Rate Last Dose  . 0.45 % sodium chloride infusion   Intravenous Continuous Rosalin Hawking, MD 50 mL/hr at 06/07/17 1428    . acetaminophen (TYLENOL) tablet 650 mg  650 mg Oral Q4H Rosalin Hawking, MD       Or  . acetaminophen (TYLENOL) solution 650 mg  650 mg Per Tube Q4H Rosalin Hawking, MD   650 mg at 06/07/17 1224   Or  . acetaminophen (TYLENOL) suppository 650 mg  650 mg Rectal Q4H Rosalin Hawking, MD      . acetylcysteine (MUCOMYST) 20 % nebulizer / oral solution 3 mL  3 mL Nebulization Q6H Rosalin Hawking, MD   1 mL at 06/07/17 1408  . amLODipine (NORVASC) tablet 10 mg  10 mg Oral Daily Rosalin Hawking, MD   10 mg at 06/07/17 0926  . Chlorhexidine Gluconate Cloth 2 % PADS 6 each  6 each Topical Daily Rosalin Hawking, MD   6 each at 06/07/17 1200  . clevidipine (CLEVIPREX) infusion 0.5 mg/mL  0-21 mg/hr Intravenous Continuous Rosalin Hawking, MD   Stopped at 06/07/17 1016  . famotidine (PEPCID) IVPB 20 mg premix  20 mg Intravenous Q24H Rumbarger, Valeda Malm, RPH      . feeding supplement (JEVITY 1.2 CAL) liquid 1,000 mL  1,000 mL Per Tube Q24H Rosalin Hawking, MD 25 mL/hr at 06/07/17 1514 1,000 mL at 06/07/17 1514  . feeding supplement (PRO-STAT SUGAR FREE 64) liquid 30 mL  30 mL Per Tube 5 X Daily Rosalin Hawking, MD   30 mL at 06/07/17 1312  . free water 100 mL  100 mL Per Tube Q4H Rosalin Hawking, MD   100 mL at 06/07/17 1200  . hydrocortisone sodium succinate (SOLU-CORTEF) 100 MG injection 50 mg  50 mg Intravenous Q6H Sampson Goon, MD   50 mg at 06/07/17 1510  . insulin aspart (novoLOG) injection 0-9 Units  0-9 Units Subcutaneous Q4H Rosalin Hawking, MD   1 Units at 06/07/17 1224  . insulin glargine (LANTUS) injection 10 Units  10 Units Subcutaneous QHS Sampson Goon, MD      . ipratropium-albuterol (DUONEB) 0.5-2.5 (3) MG/3ML nebulizer solution 3 mL  3 mL Nebulization Q6H PRN Rosalin Hawking, MD   3 mL at 06/07/17 1408  . labetalol (NORMODYNE,TRANDATE) injection 10-40 mg  10-40 mg Intravenous Q10 min PRN Rosalin Hawking, MD   20 mg at 06/07/17 1231  . losartan (COZAAR) tablet 100 mg  100 mg Oral Daily Rosalin Hawking, MD   100 mg at 06/07/17 3532  . MEDLINE mouth rinse  15 mL Mouth Rinse BID Greta Doom, MD   15 mL at 06/07/17 1000  . metoprolol tartrate (LOPRESSOR) tablet 50 mg  50 mg Oral BID Rosalin Hawking, MD   50 mg at 06/07/17 9924  . ondansetron (ZOFRAN) injection 4 mg  4 mg Intravenous Q6H  PRN Rosalin Hawking, MD   4 mg at 06/03/17 2146  . piperacillin-tazobactam (ZOSYN) IVPB 3.375 g  3.375 g Intravenous Q8H Kris Mouton, [facility]   Stopped at 06/07/17 1122  . sennosides (SENOKOT) 8.8 MG/5ML syrup 5 mL  5 mL  Oral BID Rosalin Hawking, MD      . sodium chloride flush (NS) 0.9 % injection 10-40 mL  10-40 mL Intracatheter Q12H Rosalin Hawking, MD   10 mL at 06/06/17 2159  . sodium chloride flush (NS) 0.9 % injection 10-40 mL  10-40 mL Intracatheter PRN Rosalin Hawking, MD      . vancomycin (VANCOCIN) IVPB 750 mg/150 ml premix  750 mg Intravenous Q12H Kris Mouton, [facility]   Stopped at 06/07/17 0500    Review of Systems: Review of Systems  All other systems reviewed and are negative.    PHYSICAL EXAMINATION Blood pressure (!) 158/71, pulse 89, temperature 97.8 F (36.6 C), temperature source Oral, resp. rate 18, height 5' 9.5" (1.765 m), weight 188 lb 9.6 oz (85.5 kg), SpO2 99 %.  ECOG PERFORMANCE STATUS: 0 - Asymptomatic  Physical Exam  Constitutional: He is oriented to person, place, and time. He appears well-developed and well-nourished. No distress.  HENT:  Head: Normocephalic and atraumatic.  Mouth/Throat: Oropharynx is clear and moist. No oropharyngeal exudate.  Eyes: Pupils are equal, round, and reactive to light. Conjunctivae and EOM are normal. No scleral icterus.  Neck: No thyromegaly present.  Cardiovascular: Normal rate, regular rhythm and intact distal pulses. Exam reveals no gallop and no friction rub.  No murmur heard. Pulmonary/Chest: Effort normal and breath sounds normal. No stridor. No respiratory distress. He has no wheezes. He has no rales.  Abdominal: Soft. Bowel sounds are normal. He exhibits no distension and no mass. There is no tenderness. There is no guarding.  Musculoskeletal: Normal range of motion. He exhibits no edema.  Lymphadenopathy:    He has no cervical adenopathy.  Neurological: He is alert and oriented to person, place, and time. He displays normal reflexes. No cranial nerve deficit or sensory deficit.  Skin: Skin is warm and dry. No rash noted. He is not diaphoretic. No erythema. No pallor.     LABORATORY DATA: I have personally reviewed the data as listed: No  visits with results within 1 Week(s) from this visit.  Latest known visit with results is:  Clinical Support on 05/14/2017  Component Date Value Ref Range Status  . Uric Acid, Serum 05/14/2017 4.5  2.6 - 7.4 mg/dL Final   Performed at [facility], [place] [address] [place], [place] [address]  . Flow Cytometry 05/14/2017 See Scanned report in [place]   Final   Performed at [facility], [address], [place], [place] [address]  . IgG (Immunoglobin G), Serum 05/14/2017 821  700 - 1,600 mg/dL Final  . IgA 05/14/2017 133  61 - 437 mg/dL Final  . IgM (Immunoglobulin M), Srm 05/14/2017 30  15 - 143 mg/dL Final   Comment: (NOTE) Performed At: [facility] [place], [place] [id number] Rush Farmer MD [id number] Performed at [facility], [place] [address], [place], [place] [address]   . LDH 05/14/2017 146  125 - 245 U/L Final   Performed at [facility], [place] [address] [place], [place] [address]  . Sodium 05/14/2017 140  136 - 145 mmol/L Final  . Potassium 05/14/2017 4.1  3.5 - 5.1 mmol/L Final  .  Chloride 05/14/2017 105  98 - 109 mmol/L Final  . CO2 05/14/2017 28  22 - 29 mmol/L Final  . Glucose, Bld 05/14/2017 101  70 - 140 mg/dL Final  . BUN 05/14/2017 21  7 - 26 mg/dL Final  . Creatinine 05/14/2017 1.06  0.70 - 1.30 mg/dL Final  . Calcium 05/14/2017 9.8  8.4 - 10.4 mg/dL Final  . Total Protein 05/14/2017 6.9  6.4 - 8.3 g/dL Final  . Albumin 05/14/2017 4.4  3.5 - 5.0 g/dL Final  . AST 05/14/2017 23  5 - 34 U/L Final  . ALT 05/14/2017 24  0 - 55 U/L Final  . Alkaline Phosphatase 05/14/2017 88  40 - 150 U/L Final  . Total Bilirubin 05/14/2017 1.1  0.2 - 1.2 mg/dL Final  . GFR, Est Non Af Am 05/14/2017 >60  >60 mL/min Final  . GFR, Est AFR Am 05/14/2017 >60  >60 mL/min Final   Comment: (NOTE) The eGFR has been calculated using the CKD EPI equation. This  calculation has not been validated in all clinical situations. eGFR's persistently <60 mL/min signify possible Chronic Kidney Disease.   Georgiann Hahn gap 05/14/2017 7  3 - 11 Final   Performed at [facility], [place] [address], [place], [place] [address]  . WBC Count 05/14/2017 10.4* 4.0 - 10.3 K/uL Final  . RBC 05/14/2017 4.42  4.20 - 5.82 MIL/uL Final  . Hemoglobin 05/14/2017 14.7  13.0 - 17.1 g/dL Final  . HCT 05/14/2017 43.8  38.4 - 49.9 % Final  . MCV 05/14/2017 99.1* 79.3 - 98.0 fL Final  . MCH 05/14/2017 33.3  27.2 - 33.4 pg Final  . MCHC 05/14/2017 33.6  32.0 - 36.0 g/dL Final  . RDW 05/14/2017 13.4  11.0 - 14.6 % Final  . Platelet Count 05/14/2017 163  140 - 400 K/uL Final  . Smear Review 05/14/2017 few variant lymphocytes seen   Final  . Neutrophils Relative % 05/14/2017 46  % Final  . Neutro Abs 05/14/2017 4.7  1.5 - 6.5 K/uL Final  . Lymphocytes Relative 05/14/2017 47  % Final  . Lymphs Abs 05/14/2017 4.9* 0.9 - 3.3 K/uL Final  . Monocytes Relative 05/14/2017 5  % Final  . Monocytes Absolute 05/14/2017 0.6  0.1 - 0.9 K/uL Final  . Eosinophils Relative 05/14/2017 2  % Final  . Eosinophils Absolute 05/14/2017 0.2  0.0 - 0.5 K/uL Final  . Basophils Relative 05/14/2017 0  % Final  . Basophils Absolute 05/14/2017 0.0  0.0 - 0.1 K/uL Final   Performed at [facility], [place] [address] [place], [place] [address]       Ardath Sax, MD

## 2017-06-07 NOTE — Progress Notes (Signed)
Patient's wife has declined the CPT for tonight stating that he has had nausea and vomiting with aspiration earlier today.

## 2017-06-07 NOTE — Progress Notes (Signed)
CRITICAL VALUE STICKER  CRITICAL VALUE: Sodium 162  MD NOTIFIED: Xu  TIME OF NOTIFICATION: 7106  RESPONSE: change IVF from 0.9% sodium chloride to 0.45% sodium chloride

## 2017-06-07 NOTE — Progress Notes (Signed)
RN called for RT to assess the patient as his o2 saturation had dropped in the 80's on 6lpm. The patient's o2 was increased to 15lpm and he was repositioned in the bed. The patient is currently 97% on this. Will continue to monitor for the ability to wean o2.

## 2017-06-07 NOTE — Progress Notes (Signed)
PULMONARY / CRITICAL CARE MEDICINE   Name: Jason Callahan MRN: [id number] DOB: [birth year]    ADMISSION DATE:  06/01/2017 CONSULTATION DATE: 06/06/2017  REFERRING MD:  Erlinda Hong  CHIEF COMPLAINT: Fever and increasing respiratory distress 4 days after presenting with a left frontal intracranial hemorrhage.  HISTORY OF PRESENT ILLNESS:        77 year old male with past medical history significant for OSA, GERD, PVA, HTN, and left ICA s/p CEA admitted on 5/19 with stroke.  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  CT abd/ chest/ and neck on 05/28/2017 negative for adenopathy or lymphadenopathy.   Presented with acute hypertension, right sided weakness, facial droop and expressive aphasia.  Found to have large right frontal hemorrhage with resultant global aphasia and RUE plegia.  NSGY was consulted without recommendations for surgical intervention.  He was placed on hypertonic saline.  Patient starting having fevers on 5/21 with rising leukocytosis.  Blood cultures sent, UA negative.  CXR clear on 5/20 with serial films showing stable vascular congestion.  Cortrak placed on 5/22.   Hypertonic saline held on 5/22 due to therapeutic Na.  During the evening of 5/22, patient became more tachypneic, wheezing, and slight increase in O2 requirements from 2 to 4L [place] in the setting of fever.  Repeat CXR now showing bilateral atelectasis.  PCCM consulted to assist with further management and concern of respiratory  I was asked by Dr. Phoebe Sharps afternoon to again evaluate his fever and tachypnea.  Patient is not having any significant cough and a chest x-ray obtained yesterday was only really remarkable for a small right pleural effusion.  Urine culture of 521 has shown no growth.  Does have a central catheter in place which was placed during this admission.  He also has a history of gout however his wife has not noted any inflamed joints during this admission.  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  Urine culture obtained on 5/21 showed no growth.  The patient also has a history of diverticulitis but apparently this was in the very distant past and is not been a recurring issue.  He has not been on pharmacologic DVT prophylaxis due to his intracranial hemorrhage.  Burna Cash has come down overnight since empiric initiation of steroids and antibiotics.  He is still on a cooling blanket.  He is very purposely active with the left upper extremity but is not at all interactive.   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  PAST SURGICAL HISTORY: He  has a past surgical history that includes Hernia repair (Left) and CEA (2000).  No Known Allergies  No current facility-administered medications on file prior to encounter.    Current Outpatient Medications on File Prior to Encounter  Medication Sig  . allopurinol (ZYLOPRIM) 300 MG tablet Take 300 mg by mouth daily.   . cholecalciferol (VITAMIN D) 1000 UNITS tablet Take 1,000 Units by mouth daily.  . cyanocobalamin 1000 MCG tablet Take 1,000 mcg by mouth daily.  . irbesartan-hydrochlorothiazide (AVALIDE) 300-12.5 MG per tablet Take 0.5 tablets by mouth daily.   . Multiple Vitamin (MULTIVITAMIN) tablet Take 1 tablet by mouth daily. Centrum Silver  .  rosuvastatin (CRESTOR) 20 MG tablet Take 20 mg by mouth daily.    FAMILY HISTORY:  His indicated that his mother is deceased. He indicated that his father is deceased. He indicated that his sister is alive. He indicated that his brother is alive.   SOCIAL HISTORY: He  reports that he has never smoked. He has  never used smokeless tobacco. He reports that he drinks about 4.2 oz of alcohol per week. He reports that he does not use drugs.  REVIEW OF SYSTEMS:   As above  SUBJECTIVE:  As above  VITAL SIGNS: BP 129/68   Pulse 97   Temp 100 F (37.8 C) (Rectal)   Resp (!) 25   Ht 5\' 10"  (1.778 m)   Wt 187 lb 2.7 oz (84.9 kg)   SpO2 95%   BMI 26.86 kg/m   HEMODYNAMICS:    VENTILATOR SETTINGS:    INTAKE / OUTPUT: I/O last 3 completed shifts: In: 4428.3 [I.V.:2778.3; NG/GT:1200; IV Piggyback:450] Out: 2675 [Urine:2675]  PHYSICAL EXAMINATION: General: He is not at all verbally interactive neuro: He is a little restless and very active with the left upper extremity.  He is plegic on the right.  Pupils equal and reactive.  Is not at all verbally interactive. Cardiovascular: S1 and S2 are regular without murmur rub or gallop  Lungs: Respirations are unlabored, he is not having difficulties managing upper airway secretions.  There is symmetric air movement no rhonchi and no wheezes  Abdomen: The abdomen is flat and soft without any organomegaly masses tenderness guarding or rebound Musculoskeletal: The left great toe is red but not hot or swollen, there does seem to be some tenderness over the proximal MP joint.   Skin: The right subclavian central line in place, there is no acute inflammation  LABS:  BMET Recent Labs  Lab 06/06/17 0534  06/06/17 1512 06/06/17 1830 06/07/17 0415  NA 159*  160*   < > 160* 157* 158*  K 3.2*  --  3.9  --  3.6  CL 123*  --  124*  --  123*  CO2 28  --  28  --  26  BUN 35*  --  45*  --  49*  CREATININE 1.27*  --  1.40*  --  1.45*  GLUCOSE 164*  --  159*  --  235*   < > = values in this interval not displayed.    Electrolytes Recent Labs  Lab 06/05/17 1700 06/06/17 0534 06/06/17 1512 06/07/17 0415  CALCIUM  --  9.2 9.0 8.7*  MG 2.3 2.2 2.3  --   PHOS 1.7* 2.8 3.2  --     CBC Recent Labs  Lab 06/05/17 0546 06/06/17 0534 06/07/17 0415   WBC 18.8* 18.8* 15.7*  HGB 14.9 15.6 14.0  HCT 45.7 48.3 43.6  PLT 183 163 143*    Coag's Recent Labs  Lab 05/26/2017 1755  APTT 27  INR 1.04    Sepsis Markers Recent Labs  Lab 06/06/17 0534 06/06/17 1451 06/06/17 1830  LATICACIDVEN  --  1.3 1.5  PROCALCITON <0.10  --   --     ABG Recent Labs  Lab 06/05/17 2305  PHART 7.423  PCO2ART 45.4  PO2ART 69.2*    Liver Enzymes Recent Labs  Lab 06/01/2017 1755 06/06/17 1512  AST 25 31  ALT 26 20  ALKPHOS 75 56  BILITOT 1.2 1.5*  ALBUMIN 4.2 3.5    Cardiac Enzymes No results for input(s): TROPONINI, PROBNP in the  last 168 hours.  Glucose Recent Labs  Lab 06/06/17 0735 06/06/17 1130 06/06/17 1526 06/06/17 2053 06/07/17 0009 06/07/17 0428  GLUCAP 155* 151* 132* 195* 154* 200*    Imaging Dg Chest Port 1 View  Result Date: 06/07/2017 CLINICAL DATA:  Hypoxia EXAM: PORTABLE CHEST 1 VIEW COMPARISON:  06/05/2017 FINDINGS: Cardiac shadow is stable. Right subclavian central line is again seen in stable position. Bibasilar atelectasis is noted stable from the prior study. Mild vascular congestion is seen. Feeding catheter extends into the stomach. IMPRESSION: No active disease. Electronically Signed   By: Inez Catalina M.D.   On: 06/07/2017 07:24     STUDIES:  Dopplers of the lower extremities on 5/23 shows no evidence of DVT.  Amylase is normal, LFTs were remarkable only for slightly elevated bilirubin at 1.5.  Procalcitonin yesterday was remarkably low at less than 0.10. Chest x-ray to my eye shows a right basilar atelectasis  CULTURES:  ANTIBIOTICS: Empiric Zosyn and vancomycin started 5/23  SIGNIFICANT EVENTS:  LINES/TUBES: CVP line is in place which was placed on 5/19  DISCUSSION:      This is a 77 year old who has suffered from a large left frontal intracranial hemorrhage.  We were asked to see him for fever and tachypnea with an increasing oxygen requirement.  Potential sources of fever include a  central fever, pancreatitis related to his hypertriglyceridemia, history of CLL, history of gout, a history of diverticulitis, and potential aspiration.   ASSESSMENT / PLAN:  PULMONARY A: I am continuing empiric coverage with vancomycin and Zosyn today but very much doubt that the density seen at the right base on today's chest x-ray represents aspiration.  Dopplers of lower extremities were negative.  INFECTIOUS A: Fever.  Based on the normal procalcitonin this morning his recent negative chest x-ray and negative urine and his current examination I am most suspicious that this is a central fever.  The left great toe may represent a gouty flare which should respond to his systemic steroids.  He has a very slight elevation in his bilirubin which what I will not evaluate further as he has no other suggestion of biliary tract infection.   NEUROLOGIC A: Right hemiplegia secondary to large left frontal intracranial hemorrhage  I spoke with Mrs. Lyne again this morning who confirms that she does not wish me to undertake extremely aggressive interventions other than medications.     Lars Masson, MD Critical Care Medicine [facility] Pager: [phone]  06/07/2017, 9:53 AM

## 2017-06-08 LAB — BASIC METABOLIC PANEL
Anion gap: 9 (ref 5–15)
Anion gap: 9 (ref 5–15)
BUN: 34 mg/dL — AB (ref 6–20)
BUN: 38 mg/dL — AB (ref 6–20)
CALCIUM: 8.7 mg/dL — AB (ref 8.9–10.3)
CO2: 27 mmol/L (ref 22–32)
CO2: 28 mmol/L (ref 22–32)
CREATININE: 1.08 mg/dL (ref 0.61–1.24)
CREATININE: 1.08 mg/dL (ref 0.61–1.24)
Calcium: 8.8 mg/dL — ABNORMAL LOW (ref 8.9–10.3)
Chloride: 123 mmol/L — ABNORMAL HIGH (ref 101–111)
Chloride: 120 mmol/L — ABNORMAL HIGH (ref 101–111)
GFR calc Af Amer: >60 mL/min (ref >60)
GFR calc Af Amer: >60 mL/min (ref >60)
Glucose, Bld: 193 mg/dL — ABNORMAL HIGH (ref 65–99)
Glucose, Bld: 224 mg/dL — ABNORMAL HIGH (ref 65–99)
Potassium: 3.1 mmol/L — ABNORMAL LOW (ref 3.5–5.1)
Potassium: 2.7 mmol/L — CL (ref 3.5–5.1)
SODIUM: 159 mmol/L — AB (ref 135–145)
Sodium: 157 mmol/L — ABNORMAL HIGH (ref 135–145)

## 2017-06-08 LAB — POTASSIUM: Potassium: 2.8 mmol/L — ABNORMAL LOW (ref 3.5–5.1)

## 2017-06-08 LAB — GLUCOSE, CAPILLARY
GLUCOSE-CAPILLARY: 160 mg/dL — AB (ref 65–99)
GLUCOSE-CAPILLARY: 167 mg/dL — AB (ref 65–99)
GLUCOSE-CAPILLARY: 182 mg/dL — AB (ref 65–99)
Glucose-Capillary: 194 mg/dL — ABNORMAL HIGH (ref 65–99)
Glucose-Capillary: 179 mg/dL — ABNORMAL HIGH (ref 65–99)
Glucose-Capillary: 128 mg/dL — ABNORMAL HIGH (ref 65–99)
Glucose-Capillary: 130 mg/dL — ABNORMAL HIGH (ref 65–99)

## 2017-06-08 LAB — SODIUM
Sodium: 156 mmol/L — ABNORMAL HIGH (ref 135–145)
Sodium: 155 mmol/L — ABNORMAL HIGH (ref 135–145)

## 2017-06-08 MED ORDER — POTASSIUM CHLORIDE 20 MEQ PO PACK
40.0000 meq | PACK | Freq: Once | ORAL | Status: AC
Start: 2017-06-08 — End: 2017-06-08
  Administered 2017-06-08: 40 meq via ORAL
  Filled 2017-06-08: qty 2

## 2017-06-08 MED ORDER — LABETALOL HCL 5 MG/ML IV SOLN
10.0000 mg | INTRAVENOUS | Status: DC | PRN
  Administered 2017-06-08: 10 mg via INTRAVENOUS
  Administered 2017-06-08: 40 mg via INTRAVENOUS
  Administered 2017-06-11: 20 mg via INTRAVENOUS
  Filled 2017-06-08: qty 8
  Filled 2017-06-08 (×2): qty 4

## 2017-06-08 MED ORDER — BUSPIRONE HCL 10 MG PO TABS
5.0000 mg | ORAL_TABLET | Freq: Three times a day (TID) | ORAL | Status: DC
  Administered 2017-06-08 – 2017-06-13 (×14): 5 mg via ORAL
  Filled 2017-06-08 (×3): qty 0.5
  Filled 2017-06-08 (×2): qty 1
  Filled 2017-06-08 (×3): qty 0.5
  Filled 2017-06-08 (×2): qty 1
  Filled 2017-06-08: qty 0.5
  Filled 2017-06-08 (×4): qty 1
  Filled 2017-06-08 (×2): qty 0.5
  Filled 2017-06-08: qty 1

## 2017-06-08 MED ORDER — LOPERAMIDE HCL 1 MG/7.5ML PO SUSP
4.0000 mg | Freq: Four times a day (QID) | ORAL | Status: DC | PRN
  Administered 2017-06-08 – 2017-06-13 (×5): 4 mg
  Filled 2017-06-08 (×5): qty 30

## 2017-06-08 MED ORDER — CARVEDILOL 12.5 MG PO TABS
12.5000 mg | ORAL_TABLET | Freq: Two times a day (BID) | ORAL | Status: DC
  Administered 2017-06-08 – 2017-06-09 (×3): 12.5 mg via ORAL
  Filled 2017-06-08 (×3): qty 1

## 2017-06-08 NOTE — Plan of Care (Signed)
Pt has global aphasia

## 2017-06-08 NOTE — Progress Notes (Signed)
Spoke with Caryl Pina RN re d/c CVC, RN aware.

## 2017-06-08 NOTE — Progress Notes (Signed)
STROKE TEAM PROGRESS NOTE   SUBJECTIVE (INTERVAL HISTORY) His wife and son are at the bedside. Pt is on vanco and zosyn and hydrocortisone since yesterday. And tier 1 TTM. Temp controlled well Tmax 100 today. More awake alert than yesterday but still global aphasic, moving left UE and LE spontaneously and purposeful. On TF and NS. Na 158.   OBJECTIVE Temp:  [98.2 F (36.8 C)-100.3 F (37.9 C)] 98.2 F (36.8 C) (05/25 0400) Pulse Rate:  [75-103] 77 (05/25 0715) Cardiac Rhythm: Sinus tachycardia (05/25 0400) Resp:  [14-31] 17 (05/25 0715) BP: (120-189)/(62-137) 179/86 (05/25 0715) SpO2:  [91 %-100 %] 100 % (05/25 0715) Weight:  [179 lb 0.2 oz (81.2 kg)] 179 lb 0.2 oz (81.2 kg) (05/25 0500)  Recent Labs  Lab 06/07/17 1545 06/07/17 2008 06/07/17 2359 06/08/17 0340 06/08/17 0736  GLUCAP 145* 140* 160* 182* 194*   Recent Labs  Lab 06/05/17 1235 06/05/17 1700  06/06/17 0534  06/06/17 1512  06/07/17 0415 06/07/17 1146 06/07/17 2025 06/08/17 0031 06/08/17 0622  NA 158* 157*   < > 159*  160*   < > 160*   < > 158* 162* 158* 159* 157*  K  --   --   --  3.2*  --  3.9  --  3.6  --   --  3.1* 2.7*  CL  --   --   --  123*  --  124*  --  123*  --   --  123* 120*  CO2  --   --   --  28  --  28  --  26  --   --  27 28  GLUCOSE  --   --   --  164*  --  159*  --  235*  --   --  193* 224*  BUN  --   --   --  35*  --  45*  --  49*  --   --  38* 34*  CREATININE  --   --   --  1.27*  --  1.40*  --  1.45*  --   --  1.08 1.08  CALCIUM  --   --   --  9.2  --  9.0  --  8.7*  --   --  8.8* 8.7*  MG 2.3 2.3  --  2.2  --  2.3  --   --   --   --   --   --   PHOS 2.6 1.7*  --  2.8  --  3.2  --   --   --   --   --   --    < > = values in this interval not displayed.   Recent Labs  Lab 06/09/2017 1755 06/06/17 1512  AST 25 31  ALT 26 20  ALKPHOS 75 56  BILITOT 1.2 1.5*  PROT 6.5 6.1*  ALBUMIN 4.2 3.5   Recent Labs  Lab 05/29/2017 1755  06/03/17 0722 06/04/17 0430 06/05/17 0546  06/06/17 0534 06/07/17 0415  WBC 12.9*  --  18.3* 19.5* 18.8* 18.8* 15.7*  NEUTROABS 7.0  --   --   --  12.9* 11.9* 11.0*  HGB 14.7   < > 14.4 13.4 14.9 15.6 14.0  HCT 42.9   < > 42.1 40.7 45.7 48.3 43.6  MCV 94.3  --  96.6 98.5 100.0 101.7* 100.2*  PLT 172  --  190 172 183 163 143*   < > = values  in this interval not displayed.   No results for input(s): CKTOTAL, CKMB, CKMBINDEX, TROPONINI in the last 168 hours. No results for input(s): LABPROT, INR in the last 72 hours. No results for input(s): COLORURINE, LABSPEC, [place], GLUCOSEU, HGBUR, BILIRUBINUR, KETONESUR, PROTEINUR, UROBILINOGEN, NITRITE, LEUKOCYTESUR in the last 72 hours.  Invalid input(s): APPERANCEUR     Component Value Date/Time   CHOL 111 06/03/2017 0722   TRIG 321 (H) 06/07/2017 0737   HDL 46 06/03/2017 0722   CHOLHDL 2.4 06/03/2017 0722   VLDL 23 06/03/2017 0722   LDLCALC 42 06/03/2017 0722   Lab Results  Component Value Date   HGBA1C 5.3 05/16/2017   No results found for: LABOPIA, COCAINSCRNUR, LABBENZ, AMPHETMU, THCU, LABBARB  No results for input(s): ETH in the last 168 hours.  IMAGING  Ct Soft Tissue Neck W Contrast 05/28/2017 IMPRESSION:  Negative for mass or adenopathy in the neck  Occluded left internal carotid artery.  Right carotid endarterectomy is patent.   Ct Chest W Contrast 05/29/2017 IMPRESSION:  1. No evidence of lymphadenopathy, splenomegaly or metastatic disease.  2. No acute abnormalities.  3. Nonobstructing RIGHT renal calculi  4. Moderate to marked prostate enlargement  5. Coronary artery and Aortic Atherosclerosis (ICD10-I70.0).   Ct Head Code Stroke Wo Contrast 05/24/2017 IMPRESSION:  1. Large hemorrhage in the left frontal lobe, 61 mL. Differential diagnostic considerations include amyloid, hypertension, vascular malformation. Hemorrhagic stroke not considered likely with acute onset of symptoms.  2. ASPECTS is 10 3.    Ct Angio Head W Or Wo Contrast Ct Angio Neck W Or  Wo Contrast 05/16/2017 IMPRESSION:  1. Large left frontal hematoma. No evidence of underlying vascular malformation or tumor  2. Right carotid endarterectomy widely patent  3. 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 Left cavernous carotid is patent but severely stenotic.    Ct Head Wo Contrast 06/03/2017 IMPRESSION:  1. Expanding left frontal hematoma with increased mass effect as described.  2. Increasing mass effect on the left lateral ventricle without intraventricular hemorrhage or downward herniation.    [name] Wo Contrast 06/03/2017 IMPRESSION:  1. Large left frontal lobe hematoma, unchanged from today's earlier CT. Unchanged mass effect with 12 mm of midline shift. No evidence of underlying mass. No chronic hemorrhages to suggest cerebral amyloid angiopathy.  2. Small volume adjacent subarachnoid hemorrhage and trace intraventricular hemorrhage. No evidence of hydrocephalus.    Ct Head Wo Contrast 06/04/2017 IMPRESSION:  Large intraparenchymal hemorrhage in the left frontal lobe slightly smaller, likely due to decompression the ventricles. Small amount of ventricular hemorrhage noted.  Subarachnoid hemorrhage on the left is unchanged 6.5 mm midline shift towards the right is unchanged.   Ct Head Wo Contrast 06/06/2017 IMPRESSION:  1. Evolving LEFT frontal lobe hematoma with similar intraventricular extension.  2. 7 mm LEFT-to-RIGHT midline shift. No ventricular entrapment or hydrocephalus.  3. Similar small volume subarachnoid hemorrhage.      Dg Chest Port 1 View 06/03/2017 IMPRESSION:  The new right central line is in good position.  No pneumothorax.   Dg Chest Port 1 View 06/04/2017 IMPRESSION:  Mild pulmonary vascular congestion without overt edema, unchanged.   Dg Chest Port 1 View 06/05/2017 IMPRESSION:  1. Low lung volumes with suspected tiny right effusion. Increasing atelectasis at both lung bases   2. Stable vascular congestion  3. Esophageal tube tip is in the left upper quadrant     TTE - Left ventricle: The cavity size  was normal. Wall thickness was   increased in a pattern of mild LVH. Systolic function was   vigorous. The estimated ejection fraction was in the range of 65%   to 70%. Wall motion was normal; there were no regional wall   motion abnormalities. - Aortic valve: Trileaflet; mildly thickened, moderately calcified   leaflets. Moderate calcification involving the noncoronary cusp. - Pulmonary arteries: Systolic pressure was moderately increased.   PA peak pressure: 51 mm Hg (S).   PHYSICAL EXAM  Vitals:   06/08/17 0700 06/08/17 0715 06/08/17 0817 06/08/17 0922  BP: (!) 162/83 (!) 179/86  (!) 161/81  Pulse: 79 77  86  Resp: 17 17    Temp:   100.2 F (37.9 C)   TempSrc:      SpO2: 99% 100%    Weight:      Height:         Temp:  [98.2 F (36.8 C)-100.3 F (37.9 C)] 98.2 F (36.8 C) (05/25 0400) Pulse Rate:  [75-103] 77 (05/25 0715) Resp:  [14-31] 17 (05/25 0715) BP: (120-189)/(62-137) 179/86 (05/25 0715) SpO2:  [91 %-100 %] 100 % (05/25 0715) Weight:  [179 lb 0.2 oz (81.2 kg)] 179 lb 0.2 oz (81.2 kg) (05/25 0500)  General - Well nourished, well developed, lethargic.  Ophthalmologic - fundi not visualized due to noncooperation.  Cardiovascular - Regular rate and rhythm.  Neuro - lethargic but more awake alert than yesterday, easily open eyes on voice. Able to track objects on the left but not on the right. Global aphasia, not following commands. Left pupil round 2.15mm, right surgical pupil dilated with irregular sharp, left gaze preference, not cross midline. Blinking to visual threat on the left but not on the right. Right facial droop and tongue midline in mouth. LUE spontaneous movement against gravity able to hold up on exam. LLE purposeful movement, able to against gravity. RUE flaccid and RLE 1/5 withdraw on pain stimulation. DTR 1+ and right  babinski positive. Sensation, coordination and gait not tested.   ASSESSMENT/PLAN Jason Callahan is a 77 y.o. 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 No tPA given due to [place].    ICH:  left frontal large ICH likely secondary to HTN  Resultant aphasia, right UE plegia  CT head 05/22/2017 left frontal large ICH  MRI left frontal large ICH, no CAA or tumor  CTA head and neck - no AVM or aneurysm, left ICA occlusion and right ICA patent s/p CEA  CT repeat 06/03/17 showed slight increased hematoma  NSG on board, no surgery for now  Repeat CT 06/04/17 smaller hematoma stable midline shift  Repeat CT 06/06/17 stable hematoma and midline shift  2D Echo EF 65-70%  LDL 42  HgbA1c 5.3  SCDs for VTE prophylaxis  aspirin 81 mg daily prior to admission, now on No antithrombotic.   Ongoing aggressive stroke risk factor management  Therapy recommendations:  CIR  Disposition:  pending  Cerebral edema  CT showed left frontal ICH with mass effect  off 3% and now on NS  Na Q6h, goal 150-155  Na 141->144->153->158->159->158-> 156  CT repeat 06/06/17 stable hematoma and midline shift  May consider CT repeat Sunday or Monday but stat CT if any neuro changes.  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  Leukocytosis following with oncology  WBC 18.3->19.5->18.8->18.8->15.7  Pan CT negative so far  Continue outpt follow up with oncology  Hypertension and tachycardia Stable  BP goal < 180 - relax BP goal today  On/off cleviprex  on losartan 100mg  daily,  amlodipine 10, and metoprolol 50 bid  Elevated TG - relaxed BP goal today trying to be off cleviprex  Close monitoring  Hyperlipidemia  Home meds:  crestor   LDL 42, goal < 70  Hold off statin for now due to acute ICH  Dysphagia  Cortrak placed  On TF @ 25  Other Stroke Risk Factors  Advanced age  PVD  Obstructive sleep apnea, on CPAP at home  Other Active Problems  Hyperglycemia  Hypokalemia - K 3.2->3.2->3.6 - supplement -> 2.7 - repeat pending  Diarrhea per nursing - secondary to TF -> rectal tube and imodium prn  Hospital day # 6  This patient is critically ill due to large right ICH, cerebral edema, HTN, carotid occlusion, fever and at significant risk of neurological worsening, death form cerebral edema, brain herniation, heart failure, aspiration pneumonia. This patient's care requires constant monitoring of vital signs, hemodynamics, respiratory and cardiac monitoring, review of multiple databases, neurological assessment, discussion with family, other specialists and medical decision making of high complexity. I spent 40 minutes of neurocritical care time in the care of this patient. I had long discussion with wife and son at bedside, updated pt current condition, treatment plan and potential prognosis. They expressed understanding and appreciation. I also discussed with Dr. Dominica Severin.      To contact Stroke Continuity provider, please refer to [email or web address] After hours, contact General Neurology

## 2017-06-08 NOTE — Progress Notes (Addendum)
PULMONARY / CRITICAL CARE MEDICINE   Name: Jason Callahan MRN: [id number] DOB: 1940-07-21    ADMISSION DATE:  05/28/2017 CONSULTATION DATE:  06/06/2017   REFERRING MD:  Erlinda Hong  CHIEF COMPLAINT:  Fever and increasing respiratory distress following ICH  HISTORY OF PRESENT ILLNESS:   77 year old male with past medical history significant for OSA, GERD, PVA, HTN, and left ICA s/p CEA admitted on 5/19 with stroke.  Presented with acute hypertension, right sided weakness, facial droop and expressive aphasia. Found to have large right frontal hemorrhage with resultant global aphasia and RUE plegia. NSGY was consulted without recommendations for surgical intervention. He was placed on hypertonic saline  Patient subsequently developed increasing fever and tachypnea.  Chest x-ray is compatible with aspiration.  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  PAST SURGICAL HISTORY: He  has a past surgical history that includes Hernia repair (Left) and CEA (2000).  No Known Allergies  No current facility-administered medications on file prior to encounter.    Current Outpatient Medications on File Prior to Encounter  Medication Sig  . allopurinol (ZYLOPRIM) 300 MG tablet Take 300 mg by mouth daily.   . cholecalciferol (VITAMIN D) 1000 UNITS tablet Take 1,000 Units by mouth daily.  . cyanocobalamin 1000 MCG tablet Take 1,000 mcg by mouth daily.  . irbesartan-hydrochlorothiazide (AVALIDE) 300-12.5 MG per tablet Take 0.5 tablets by mouth daily.   . Multiple Vitamin (MULTIVITAMIN) tablet Take 1 tablet by mouth daily. Centrum Silver  . rosuvastatin (CRESTOR) 20 MG tablet Take 20 mg by mouth daily.    FAMILY HISTORY:  His  indicated that his mother is deceased. He indicated that his father is deceased. He indicated that his sister is alive. He indicated that his brother is alive.   SOCIAL HISTORY: He  reports that he has never smoked. He has never used smokeless tobacco. He reports that he drinks about 4.2 oz of alcohol per week. He reports that he does not use drugs.  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  SUBJECTIVE:  According to family he is more confused and agitated today with rising fever.  VITAL SIGNS: BP (!) 166/84   Pulse 94   Temp 100.1 F (37.8 C) (Core)   Resp (!) 25   Ht 5\' 10"  (1.778 m)   Wt 179 lb 0.2 oz (81.2 kg)   SpO2 98%   BMI 25.69 kg/m   HEMODYNAMICS:  Hypertensive on no infusions  VENTILATOR SETTINGS:  Currently not intubated.  DO NOT INTUBATE as per family/patient's request.  INTAKE / OUTPUT: I/O last 3 completed shifts: In: 4339.7 [I.V.:2450.4; NG/GT:1639.3; IV Piggyback:250] Out: 3875 [Urine:3990]  PHYSICAL EXAMINATION: General: Appears stated age.  Generally looks uncomfortable and agitated. Neuro: Unable to follow commands facial grimacing with discomfort.  Moves purposefully on the left side right hemiplegia. HEENT: Bridled cortrak tube in place.  No nasal damage Cardiovascular: Hypertensive.  S1-S2 unremarkable.  No peripheral edema. Lungs: Auscultation reveals rhonchi on the left side.  Positive upper airway congestion Abdomen: Scaphoid abdomen nontender.  Tolerating tube feeds. Musculoskeletal: No edema. Skin: Skin fragility with ecchymoses.  LABS:  BMET Recent Labs  Lab 06/07/17 0415  06/08/17 0031 06/08/17 0622 06/08/17 1138  NA 158*   < > 159* 157* 156*  K 3.6  --  3.1* 2.7*  --   CL 123*  --  123* 120*  --   CO2 26  --  27 28  --   BUN 49*  --  38* 34*  --   CREATININE 1.45*  --  1.08 1.08  --   GLUCOSE 235*  --  193* 224*  --    < > = values in this interval not displayed.     Electrolytes Recent Labs  Lab 06/05/17 1700 06/06/17 0534 06/06/17 1512 06/07/17 0415 06/08/17 0031 06/08/17 0622  CALCIUM  --  9.2 9.0 8.7* 8.8* 8.7*  MG 2.3 2.2 2.3  --   --   --   PHOS 1.7* 2.8 3.2  --   --   --     CBC Recent Labs  Lab 06/05/17 0546 06/06/17 0534 06/07/17 0415  WBC 18.8* 18.8* 15.7*  HGB 14.9 15.6 14.0  HCT 45.7 48.3 43.6  PLT 183 163 143*    Coag's Recent Labs  Lab 06/04/2017 1755  APTT 27  INR 1.04    Sepsis Markers Recent Labs  Lab 06/06/17 0534 06/06/17 1451 06/06/17 1830  LATICACIDVEN  --  1.3 1.5  PROCALCITON <0.10  --   --     ABG Recent Labs  Lab 06/05/17 2305  PHART 7.423  PCO2ART 45.4  PO2ART 69.2*    Liver Enzymes Recent Labs  Lab 06/13/2017 1755 06/06/17 1512  AST 25 31  ALT 26 20  ALKPHOS 75 56  BILITOT 1.2 1.5*  ALBUMIN 4.2 3.5    Cardiac Enzymes No results for input(s): TROPONINI, PROBNP in the last 168 hours.  Glucose Recent Labs  Lab 06/07/17 2008 06/07/17 2359 06/08/17 0340 06/08/17 0736 06/08/17 1135 06/08/17 1546  GLUCAP 140* 160* 182* 194* 179* 128*    Imaging No results found.   STUDIES:  N/A  CULTURES: Results for orders placed or performed during the hospital encounter of 06/10/2017  MRSA PCR Screening     Status: None   Collection Time: 05/15/2017  7:48 PM  Result Value Ref Range Status   MRSA by PCR NEGATIVE NEGATIVE Final    Comment:        The GeneXpert MRSA Assay (FDA approved for NASAL specimens only), is one component of a comprehensive MRSA colonization surveillance program. It is not intended to diagnose MRSA infection nor to guide or monitor treatment for MRSA infections. Performed at [facility], [place] [address], [place], [place] [address]   Culture, blood (Routine X 2) w Reflex to ID Panel     Status: None (Preliminary result)   Collection Time: 06/04/17 11:21 AM  Result Value Ref Range Status   Specimen Description BLOOD RIGHT HAND  Final   Special  Requests   Final    BOTTLES DRAWN AEROBIC AND ANAEROBIC Blood Culture adequate volume   Culture   Final    NO GROWTH 4 DAYS Performed at [facility], [place] [address] [place], [place] [address]    Report Status PENDING  Incomplete  Culture, blood (Routine X 2) w Reflex to ID Panel     Status: None (Preliminary result)   Collection Time: 06/04/17 11:26 AM  Result Value Ref Range Status   Specimen Description BLOOD LEFT HAND  Final   Special Requests   Final    BOTTLES DRAWN AEROBIC AND ANAEROBIC Blood Culture results may not be optimal due to an inadequate volume of blood received in culture bottles  Culture   Final    NO GROWTH 4 DAYS Performed at [facility], [place] [address] [place], [place] [address]    Report Status PENDING  Incomplete     ANTIBIOTICS: Anti-infectives (From admission, onward)   Start     Dose/Rate Route Frequency Ordered Stop   06/07/17 0330  vancomycin (VANCOCIN) IVPB 750 mg/150 ml premix     750 mg 150 mL/hr over 60 Minutes Intravenous Every 12 hours 06/06/17 1536     06/06/17 2330  piperacillin-tazobactam (ZOSYN) IVPB 3.375 g     3.375 g 12.5 mL/hr over 240 Minutes Intravenous Every 8 hours 06/06/17 1536     06/06/17 1530  piperacillin-tazobactam (ZOSYN) IVPB 3.375 g     3.375 g 100 mL/hr over 30 Minutes Intravenous  Once 06/06/17 1455 06/06/17 1553   06/06/17 1530  vancomycin (VANCOCIN) 1,500 mg in sodium chloride 0.9 % 500 mL IVPB     1,500 mg 250 mL/hr over 120 Minutes Intravenous  Once 06/06/17 1455 06/06/17 1723      SIGNIFICANT EVENTS: Increasingly confused and lethargic today.  LINES/TUBES: Drain   External Urinary Catheter 5 days  Rectal Tube/Pouch less than 1 day     CVC Line   CVC Triple Lumen 06/03/17 Right Subclavian 5 days     PIV Line   Peripheral IV 06/04/2017 Left Antecubital 5 days     NG/OG Tube   Nasoenteric Feeding Tube Cortrak - 43 inches Left nare Documented cm marking at nare/ corner of mouth 90 cm  3 days      DISCUSSION: 77 year old man with poor neurological recovery after significant left hemispheric intraparenchymal hemorrhage.  Long-term prognosis at this age with the size of the of the bleed is relatively poor (ICH score 3 - 72% mortality).  Recovery beyond his current level of function is doubtful and will take time. The patient has stipulated that he would not want intubation or CPR.  ASSESSMENT / PLAN:  PULMONARY A: Recurrent aspiration.  Likely cause of the fever as cultures are negative. P:   Continue n.p.o. status.  Maintain head of bed elevated.  Patient would not want a tracheostomy.   CARDIOVASCULAR A:  Remains hypertensive despite current regimen.  P:   Will intensify current antihypertensives Secondary Prevention: ASA on hold  RENAL A:   Mild AKI has now resolved with additional hydration.  High insensitive losses due to fever likely. Volume Status: Euvolemic P:   Continue current fluid strategy Foley: Is required for patient comfort and precisely measure urine output.  GASTROINTESTINAL A:   Nutritional Status: At high nutritional risk P:   Continue nasogastric feeding.  HEMATOLOGIC A:   At high DVT risk DVT prophylaxis: Initiate chemical prophylaxis at day 7 post bleed; Transfusion requirements: None P:  As above  INFECTIOUS A:   Persistent fever and leukocytosis despite negative cultures.  Likely chronic sterile aspiration. P:   Complete 7-day course of Zosyn.  However fever is not improved with antibiotics suggesting a noninfectious cause. Line Removal: remove central line now that patient is off hypertonic saline.  ENDOCRINE A:   No prior history of diabetes.  Stress hyperglycemia. Glycemic control: Acceptable; Stress steroids: None P:   Continue basal bolus insulin for coverage.  NEUROLOGIC A:   Now 6 days post large intra-parenchymal hemorrhage.  Elevated ICH score poor intermediate and long-term prognosis.  Patient has already  stipulated limitations on care. P: Aggressive fever control.  FAMILY  - Updates:.  Updated family at bedside  today.  The seem to understand the severity of his illness and I explained to them the time course and evolution of intracerebral hemorrhages.  They understand that the prognosis is poor regardless of how aggressive care he may receive.  They are hopeful that he will improve with better fever control but admitted clear that the patient would not want a tracheostomy or PEG tube and that likewise he would not wish to be intubated or placed on mechanical ventilation.  He would not wish CPR.   CRITICAL CARE Performed by: Kipp Brood  Total critical care time: 40 minutes  Critical care time was exclusive of separately billable procedures and treating other patients.  Critical care was necessary to treat or prevent imminent or life-threatening deterioration.  Critical care was time spent personally by me on the following activities: development of treatment plan with patient and/or surrogate as well as nursing, discussions with consultants, evaluation of patient's response to treatment, examination of patient, obtaining history from patient or surrogate, ordering and performing treatments and interventions, ordering and review of laboratory studies, ordering and review of radiographic studies, pulse oximetry and re-evaluation of patient's condition.  Kipp Brood, MD [facility] and [place] Pager: [phone] After hours [phone]  06/08/2017, 4:40 PM

## 2017-06-09 ENCOUNTER — Inpatient Hospital Stay (HOSPITAL_COMMUNITY): Payer: PPO

## 2017-06-09 DIAGNOSIS — I610 Nontraumatic intracerebral hemorrhage in hemisphere, subcortical: Secondary | ICD-10-CM

## 2017-06-09 LAB — URINALYSIS, COMPLETE (UACMP) WITH MICROSCOPIC
Bacteria, UA: NONE SEEN
Bilirubin Urine: NEGATIVE
GLUCOSE, UA: NEGATIVE mg/dL
KETONES UR: NEGATIVE mg/dL
Leukocytes, UA: NEGATIVE
NITRITE: NEGATIVE
PH: 6 (ref 5.0–8.0)
Protein, ur: NEGATIVE mg/dL
Specific Gravity, Urine: 1.018 (ref 1.005–1.030)

## 2017-06-09 LAB — BASIC METABOLIC PANEL
Anion gap: 7 (ref 5–15)
Anion gap: 10 (ref 5–15)
Anion gap: 7 (ref 5–15)
BUN: 31 mg/dL — AB (ref 6–20)
BUN: 32 mg/dL — ABNORMAL HIGH (ref 6–20)
BUN: 33 mg/dL — AB (ref 6–20)
CHLORIDE: 112 mmol/L — AB (ref 101–111)
CHLORIDE: 112 mmol/L — AB (ref 101–111)
CO2: 30 mmol/L (ref 22–32)
CO2: 28 mmol/L (ref 22–32)
CO2: 28 mmol/L (ref 22–32)
CREATININE: 1.09 mg/dL (ref 0.61–1.24)
Calcium: 8.6 mg/dL — ABNORMAL LOW (ref 8.9–10.3)
Calcium: 8.8 mg/dL — ABNORMAL LOW (ref 8.9–10.3)
Calcium: 8.4 mg/dL — ABNORMAL LOW (ref 8.9–10.3)
Chloride: 113 mmol/L — ABNORMAL HIGH (ref 101–111)
Creatinine, Ser: 1.02 mg/dL (ref 0.61–1.24)
Creatinine, Ser: 1.05 mg/dL (ref 0.61–1.24)
GFR calc Af Amer: >60 mL/min (ref >60)
GFR calc Af Amer: >60 mL/min (ref >60)
GFR calc non Af Amer: >60 mL/min (ref >60)
GFR calc non Af Amer: >60 mL/min (ref >60)
GFR calc non Af Amer: >60 mL/min (ref >60)
GLUCOSE: 140 mg/dL — AB (ref 65–99)
Glucose, Bld: 168 mg/dL — ABNORMAL HIGH (ref 65–99)
Glucose, Bld: 152 mg/dL — ABNORMAL HIGH (ref 65–99)
POTASSIUM: 2.6 mmol/L — AB (ref 3.5–5.1)
POTASSIUM: 3.9 mmol/L (ref 3.5–5.1)
Potassium: 4.1 mmol/L (ref 3.5–5.1)
SODIUM: 150 mmol/L — AB (ref 135–145)
Sodium: 149 mmol/L — ABNORMAL HIGH (ref 135–145)
Sodium: 148 mmol/L — ABNORMAL HIGH (ref 135–145)

## 2017-06-09 LAB — GLUCOSE, CAPILLARY
GLUCOSE-CAPILLARY: 136 mg/dL — AB (ref 65–99)
GLUCOSE-CAPILLARY: 129 mg/dL — AB (ref 65–99)
Glucose-Capillary: 130 mg/dL — ABNORMAL HIGH (ref 65–99)
Glucose-Capillary: 141 mg/dL — ABNORMAL HIGH (ref 65–99)
Glucose-Capillary: 133 mg/dL — ABNORMAL HIGH (ref 65–99)
Glucose-Capillary: 101 mg/dL — ABNORMAL HIGH (ref 65–99)

## 2017-06-09 LAB — CULTURE, BLOOD (ROUTINE X 2)
CULTURE: NO GROWTH
CULTURE: NO GROWTH
SPECIAL REQUESTS: ADEQUATE

## 2017-06-09 LAB — CBC WITH DIFFERENTIAL/PLATELET
Abs Immature Granulocytes: 0.4 10*3/uL — ABNORMAL HIGH (ref 0.0–0.1)
BASOS ABS: 0.1 10*3/uL (ref 0.0–0.1)
Basophils Relative: 1 %
EOS ABS: 0.2 10*3/uL (ref 0.0–0.7)
EOS PCT: 1 %
HEMATOCRIT: 46.1 % (ref 39.0–52.0)
HEMOGLOBIN: 15.3 g/dL (ref 13.0–17.0)
Immature Granulocytes: 3 %
LYMPHS ABS: 4.6 10*3/uL — AB (ref 0.7–4.0)
LYMPHS PCT: 27 %
MCH: 32.6 pg (ref 26.0–34.0)
MCHC: 33.2 g/dL (ref 30.0–36.0)
MCV: 98.3 fL (ref 78.0–100.0)
Monocytes Absolute: 1 10*3/uL (ref 0.1–1.0)
Monocytes Relative: 6 %
Neutro Abs: 10.5 10*3/uL — ABNORMAL HIGH (ref 1.7–7.7)
Neutrophils Relative %: 62 %
Platelets: 139 10*3/uL — ABNORMAL LOW (ref 150–400)
RBC: 4.69 MIL/uL (ref 4.22–5.81)
RDW: 13.1 % (ref 11.5–15.5)
WBC: 16.8 10*3/uL — AB (ref 4.0–10.5)

## 2017-06-09 LAB — SODIUM: SODIUM: 155 mmol/L — AB (ref 135–145)

## 2017-06-09 MED ORDER — HYDRALAZINE HCL 25 MG PO TABS
25.0000 mg | ORAL_TABLET | Freq: Three times a day (TID) | ORAL | Status: DC
  Administered 2017-06-09 – 2017-06-12 (×8): 25 mg via ORAL
  Filled 2017-06-09 (×8): qty 1

## 2017-06-09 MED ORDER — POTASSIUM CHLORIDE 20 MEQ PO PACK
40.0000 meq | PACK | Freq: Two times a day (BID) | ORAL | Status: AC
  Administered 2017-06-09 – 2017-06-12 (×6): 40 meq via ORAL
  Filled 2017-06-09 (×6): qty 2

## 2017-06-09 MED ORDER — POTASSIUM CHLORIDE 10 MEQ/50ML IV SOLN
10.0000 meq | INTRAVENOUS | Status: AC
  Administered 2017-06-09 (×10): 10 meq via INTRAVENOUS
  Filled 2017-06-09 (×10): qty 50

## 2017-06-09 MED ORDER — PSYLLIUM 95 % PO PACK
1.0000 | PACK | Freq: Two times a day (BID) | ORAL | Status: DC
  Administered 2017-06-09 – 2017-06-13 (×3): 1 via ORAL
  Filled 2017-06-09 (×8): qty 1

## 2017-06-09 MED ORDER — CARVEDILOL 12.5 MG PO TABS
25.0000 mg | ORAL_TABLET | Freq: Two times a day (BID) | ORAL | Status: DC
  Administered 2017-06-10 – 2017-06-13 (×7): 25 mg via ORAL
  Filled 2017-06-09 (×7): qty 2

## 2017-06-09 MED ORDER — FAMOTIDINE 40 MG/5ML PO SUSR
20.0000 mg | Freq: Two times a day (BID) | ORAL | Status: DC
  Filled 2017-06-09: qty 2.5

## 2017-06-09 NOTE — Progress Notes (Signed)
STROKE TEAM PROGRESS NOTE   SUBJECTIVE (INTERVAL HISTORY) His wife are at the bedside. Pt is on vanco and zosyn and hydrocortisone. More awake alert than yesterday but still global aphasic, moving left UE and LE spontaneously and purposeful. On TF and NS. Discussed with wife today again, he is opening his eyes to stim, blinks to threat on the left.   OBJECTIVE Temp:  [98.6 F (37 C)-100.2 F (37.9 C)] 98.6 F (37 C) (05/26 0400) Pulse Rate:  [58-109] 104 (05/26 0752) Cardiac Rhythm: Normal sinus rhythm (05/26 0400) Resp:  [15-25] 15 (05/26 0645) BP: (123-189)/(58-118) 145/79 (05/26 0752) SpO2:  [85 %-100 %] 99 % (05/26 0645) Weight:  [174 lb 9.7 oz (79.2 kg)] 174 lb 9.7 oz (79.2 kg) (05/26 0400)  Recent Labs  Lab 06/08/17 1546 06/08/17 1929 06/08/17 2320 06/09/17 0323 06/09/17 0739  GLUCAP 128* 130* 167* 136* 130*   Recent Labs  Lab 06/05/17 1235 06/05/17 1700  06/06/17 0534  06/06/17 1512  06/07/17 0415  06/08/17 0031 06/08/17 0622 06/08/17 1138 06/08/17 1716 06/08/17 1746 06/09/17 0023 06/09/17 0414  NA 158* 157*   < > 159*  160*   < > 160*   < > 158*   < > 159* 157* 156* 155*  --  155* 149*  K  --   --   --  3.2*  --  3.9  --  3.6  --  3.1* 2.7*  --   --  2.8*  --  2.6*  CL  --   --   --  123*  --  124*  --  123*  --  123* 120*  --   --   --   --  112*  CO2  --   --   --  28  --  28  --  26  --  27 28  --   --   --   --  30  GLUCOSE  --   --   --  164*  --  159*  --  235*  --  193* 224*  --   --   --   --  168*  BUN  --   --   --  35*  --  45*  --  49*  --  38* 34*  --   --   --   --  31*  CREATININE  --   --   --  1.27*  --  1.40*  --  1.45*  --  1.08 1.08  --   --   --   --  1.02  CALCIUM  --   --   --  9.2  --  9.0  --  8.7*  --  8.8* 8.7*  --   --   --   --  8.6*  MG 2.3 2.3  --  2.2  --  2.3  --   --   --   --   --   --   --   --   --   --   PHOS 2.6 1.7*  --  2.8  --  3.2  --   --   --   --   --   --   --   --   --   --    < > = values in this interval not  displayed.   Recent Labs  Lab 05/19/2017 1755 06/06/17 1512  AST 25 31  ALT 26 20  ALKPHOS  75 56  BILITOT 1.2 1.5*  PROT 6.5 6.1*  ALBUMIN 4.2 3.5   Recent Labs  Lab 05/15/2017 1755  06/04/17 0430 06/05/17 0546 06/06/17 0534 06/07/17 0415 06/09/17 0414  WBC 12.9*   < > 19.5* 18.8* 18.8* 15.7* 16.8*  NEUTROABS 7.0  --   --  12.9* 11.9* 11.0* 10.5*  HGB 14.7   < > 13.4 14.9 15.6 14.0 15.3  HCT 42.9   < > 40.7 45.7 48.3 43.6 46.1  MCV 94.3   < > 98.5 100.0 101.7* 100.2* 98.3  PLT 172   < > 172 183 163 143* 139*   < > = values in this interval not displayed.   No results for input(s): CKTOTAL, CKMB, CKMBINDEX, TROPONINI in the last 168 hours. No results for input(s): LABPROT, INR in the last 72 hours. No results for input(s): COLORURINE, LABSPEC, [place], GLUCOSEU, HGBUR, BILIRUBINUR, KETONESUR, PROTEINUR, UROBILINOGEN, NITRITE, LEUKOCYTESUR in the last 72 hours.  Invalid input(s): APPERANCEUR     Component Value Date/Time   CHOL 111 06/03/2017 0722   TRIG 321 (H) 06/07/2017 0737   HDL 46 06/03/2017 0722   CHOLHDL 2.4 06/03/2017 0722   VLDL 23 06/03/2017 0722   LDLCALC 42 06/03/2017 0722   Lab Results  Component Value Date   HGBA1C 5.3 05/19/2017   No results found for: LABOPIA, COCAINSCRNUR, LABBENZ, AMPHETMU, THCU, LABBARB  No results for input(s): ETH in the last 168 hours.  IMAGING  Ct Soft Tissue Neck W Contrast 05/28/2017 IMPRESSION:  Negative for mass or adenopathy in the neck  Occluded left internal carotid artery.  Right carotid endarterectomy is patent.   Ct Chest W Contrast 05/29/2017 IMPRESSION:  1. No evidence of lymphadenopathy, splenomegaly or metastatic disease.  2. No acute abnormalities.  3. Nonobstructing RIGHT renal calculi  4. Moderate to marked prostate enlargement  5. Coronary artery and Aortic Atherosclerosis (ICD10-I70.0).   Ct Head Code Stroke Wo Contrast 05/21/2017 IMPRESSION:  1. Large hemorrhage in the left frontal lobe, 61 mL.  Differential diagnostic considerations include amyloid, hypertension, vascular malformation. Hemorrhagic stroke not considered likely with acute onset of symptoms.  2. ASPECTS is 10 3.    Ct Angio Head W Or Wo Contrast Ct Angio Neck W Or Wo Contrast 05/23/2017 IMPRESSION:  1. Large left frontal hematoma. No evidence of underlying vascular malformation or tumor  2. Right carotid endarterectomy widely patent  3. 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 Left cavernous carotid is patent but severely stenotic.    Ct Head Wo Contrast 06/03/2017 IMPRESSION:  1. Expanding left frontal hematoma with increased mass effect as described.  2. Increasing mass effect on the left lateral ventricle without intraventricular hemorrhage or downward herniation.    [name] Wo Contrast 06/03/2017 IMPRESSION:  1. Large left frontal lobe hematoma, unchanged from today's earlier CT. Unchanged mass effect with 12 mm of midline shift. No evidence of underlying mass. No chronic hemorrhages to suggest cerebral amyloid angiopathy.  2. Small volume adjacent subarachnoid hemorrhage and trace intraventricular hemorrhage. No evidence of hydrocephalus.    Ct Head Wo Contrast 06/04/2017 IMPRESSION:  Large intraparenchymal hemorrhage in the left frontal lobe slightly smaller, likely due to decompression the ventricles. Small amount of ventricular hemorrhage noted.  Subarachnoid hemorrhage on the left is unchanged 6.5 mm midline shift towards the right is unchanged.   Ct Head Wo Contrast 06/06/2017 IMPRESSION:  1. Evolving LEFT frontal lobe hematoma with similar intraventricular extension.  2. 7  mm LEFT-to-RIGHT midline shift. No ventricular entrapment or hydrocephalus.  3. Similar small volume subarachnoid hemorrhage.      Dg Chest Port 1 View 06/03/2017 IMPRESSION:  The new right central line is in good position.  No pneumothorax.   Dg Chest Port 1  View 06/04/2017 IMPRESSION:  Mild pulmonary vascular congestion without overt edema, unchanged.   Dg Chest Port 1 View 06/05/2017 IMPRESSION:  1. Low lung volumes with suspected tiny right effusion. Increasing atelectasis at both lung bases  2. Stable vascular congestion  3. Esophageal tube tip is in the left upper quadrant   Dg Chest Port 1 View 06/07/2017 IMPRESSION: No active disease.  Dg Chest Port 1 View 06/07/2017 pending    TTE - Left ventricle: The cavity size was normal. Wall thickness was   increased in a pattern of mild LVH. Systolic function was   vigorous. The estimated ejection fraction was in the range of 65%   to 70%. Wall motion was normal; there were no regional wall   motion abnormalities. - Aortic valve: Trileaflet; mildly thickened, moderately calcified   leaflets. Moderate calcification involving the noncoronary cusp. - Pulmonary arteries: Systolic pressure was moderately increased.   PA peak pressure: 51 mm Hg (S).   PHYSICAL EXAM  Vitals:   06/09/17 0615 06/09/17 0630 06/09/17 0645 06/09/17 0752  BP: (!) 167/94 (!) 144/75 (!) 145/76 (!) 145/79  Pulse: 96 99 89 (!) 104  Resp: 19 (!) 23 15   Temp:      TempSrc:      SpO2: 100% 99% 99%   Weight:      Height:         Temp:  [98.6 F (37 C)-100.2 F (37.9 C)] 98.6 F (37 C) (05/26 0400) Pulse Rate:  [58-109] 104 (05/26 0752) Resp:  [15-25] 15 (05/26 0645) BP: (123-189)/(58-118) 145/79 (05/26 0752) SpO2:  [85 %-100 %] 99 % (05/26 0645) Weight:  [174 lb 9.7 oz (79.2 kg)] 174 lb 9.7 oz (79.2 kg) (05/26 0400)  General - Well nourished, well developed, lethargic. Opens eyes to stim  Ophthalmologic - fundi not visualized due to noncooperation.  Cardiovascular - Regular rate and rhythm.  Neuro - lethargic but more awake alert than yesterday, opens eyes on stim and voice. Blinks on the left not on th right.  Global aphasia, not following commands. Left pupil round 2.50mm, right surgical pupil  dilated with irregular sharp, left gaze preference, not cross midline. Blinking to visual threat on the left but not on the right. Right facial droop and tongue midline in mouth. LUE spontaneous movement against gravity able to hold up on exam. LLE purposeful movement, able to against gravity. RUE flaccid and RLE 1/5 withdraw on pain stimulation. DTR 1+ and right babinski positive. Sensation, coordination and gait not tested.   ASSESSMENT/PLAN [name] is a 77 y.o. 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 No tPA given due to [place].    ICH:  left frontal large ICH likely secondary to HTN  Resultant aphasia, right UE plegia  CT head 05/26/2017 left frontal large ICH  MRI left frontal large ICH, no CAA or tumor  CTA head and neck - no AVM or aneurysm, left ICA occlusion and right ICA patent s/p CEA  CT repeat 06/03/17 showed slight increased hematoma  NSG on board, no surgery for now  Repeat CT 06/04/17 smaller hematoma  stable midline shift  Repeat CT 06/06/17 stable hematoma and midline shift  2D Echo EF 65-70%  LDL 42  HgbA1c 5.3  SCDs for VTE prophylaxis  aspirin 81 mg daily prior to admission, now on No antithrombotic.   Ongoing aggressive stroke risk factor management  Therapy recommendations:  CIR  Disposition:  pending  Cerebral edema  CT showed left frontal ICH with mass effect  off 3% and now on NS  Na Q6h, goal 150-155  Na 141->144->153->158->159->158-> 156-> 149  CT repeat 06/06/17 stable hematoma and midline shift  CT repeat  Monday morning but stat CT if any neuro changes.  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  Leukocytosis following with oncology  WBC 18.3->19.5->18.8->18.8->15.7->16.8  Pan CT negative so far  Continue outpt follow up with oncology  Hypertension and tachycardia Stable  BP goal < 180 - relax BP goal today  On/off cleviprex  on losartan 100mg  daily, amlodipine 10, and metoprolol 50 bid  Elevated TG - relaxed BP goal today trying to be off cleviprex  Close monitoring  Hyperlipidemia  Home meds:  crestor   LDL 42, goal < 70  Hold off statin for now due to acute ICH  Dysphagia  Cortrak placed  On TF @ 25  Other Stroke Risk Factors  Advanced age  PVD  Obstructive sleep apnea, on CPAP at home  Other Active Problems  Hyperglycemia  Hypokalemia - K 3.2->3.2->3.6 - supplement -> 2.7 -> 2.6   supplement ordered - likely loss from diarrhea - recheck later today  Diarrhea per nursing - secondary to TF -> rectal tube and imodium prn  Check CXR ; UA ;  Could not check stool for Cdiff as he took Senna, may consider  Mild thrombocytopenia - monitor  PLAN  Recheck potassium 1:00 PM today - supplement further as need per tube  Check stool for C difficile  Continue Vanco and Zosyn - Day # 4  Stat portable CXR  Recheck labs in AM  Check U/A  Blood cultures if fever spikes (currently 98.6)  Appreciate CCM's assistance  Hospital day # 7  This patient is critically ill and at significant risk of neurological worsening, death and care requires constant monitoring of vital signs, hemodynamics,respiratory and cardiac monitoring,review of multiple databases, neurological assessment, discussion with family, other specialists and medical decision making of high complexity.I  I spent 30 minutes of neurocritical care time in the care of this patient.  Sarina Ill, MD    To contact Stroke Continuity provider, please refer to [email or web address] After hours, contact  General Neurology

## 2017-06-09 NOTE — Progress Notes (Addendum)
PULMONARY / CRITICAL CARE MEDICINE   Name: Jason Callahan MRN: [id number] DOB: 02/13/1940    ADMISSION DATE:  06/09/2017 CONSULTATION DATE:  06/06/2017   REFERRING MD:  Erlinda Hong  CHIEF COMPLAINT:  Fever and increasing respiratory distress following ICH  HISTORY OF PRESENT ILLNESS:   77 year old male with past medical history significant for OSA, GERD, PVA, HTN, and left ICA s/p CEA admitted on 5/19 with stroke.  Presented with acute hypertension, right sided weakness, facial droop and expressive aphasia. Found to have large right frontal hemorrhage with resultant global aphasia and RUE plegia. NSGY was consulted without recommendations for surgical intervention. He was placed on hypertonic saline  Patient subsequently developed increasing fever and tachypnea.  Chest x-ray is compatible with aspiration.  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  PAST SURGICAL HISTORY: He  has a past surgical history that includes Hernia repair (Left) and CEA (2000).  No Known Allergies  No current facility-administered medications on file prior to encounter.    Current Outpatient Medications on File Prior to Encounter  Medication Sig  . allopurinol (ZYLOPRIM) 300 MG tablet Take 300 mg by mouth daily.   . cholecalciferol (VITAMIN D) 1000 UNITS tablet Take 1,000 Units by mouth daily.  . cyanocobalamin 1000 MCG tablet Take 1,000 mcg by mouth daily.  . irbesartan-hydrochlorothiazide (AVALIDE) 300-12.5 MG per tablet Take 0.5 tablets by mouth daily.   . Multiple Vitamin (MULTIVITAMIN) tablet Take 1 tablet by mouth daily. Centrum Silver  . rosuvastatin (CRESTOR) 20 MG tablet Take 20 mg by mouth daily.    FAMILY HISTORY:  His  indicated that his mother is deceased. He indicated that his father is deceased. He indicated that his sister is alive. He indicated that his brother is alive.   SOCIAL HISTORY: He  reports that he has never smoked. He has never used smokeless tobacco. He reports that he drinks about 4.2 oz of alcohol per week. He reports that he does not use drugs.  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  SUBJECTIVE:  According to family he is more confused and agitated today with rising fever.  VITAL SIGNS: BP (!) 167/73   Pulse (!) 109   Temp 99.5 F (37.5 C) (Axillary)   Resp 20   Ht 5\' 10"  (1.778 m)   Wt 174 lb 9.7 oz (79.2 kg)   SpO2 98%   BMI 25.05 kg/m   HEMODYNAMICS:  Hypertensive on no infusions  VENTILATOR SETTINGS:  Currently not intubated.  DO NOT INTUBATE as per family/patient's request.  INTAKE / OUTPUT: I/O last 3 completed shifts: In: 5129.4 [I.V.:1789.4; NG/GT:2640; IV Piggyback:700] Out: 3670 [Urine:3170; Stool:500]  PHYSICAL EXAMINATION: General: Appears stated age.  Generally looks calm and comfortable. Neuro:  Followed some commands for RN.  Moves purposefully on the left side right hemiplegia. HEENT: Bridled cortrak tube in place.  No nasal damage Cardiovascular: Hypertensive.  S1-S2 unremarkable.  No peripheral edema. Lungs: Auscultation reveals rhonchi on the left side.  Positive upper airway congestion Abdomen: Scaphoid abdomen nontender.  Tolerating tube feeds. Musculoskeletal: No edema. Skin: Skin fragility with ecchymoses.  LABS:  BMET Recent Labs  Lab 06/09/17 0414 06/09/17 1347 06/09/17 1945  NA 149* 150* 148*  K 2.6* 4.1 3.9  CL 112* 112*  113*  CO2 30 28 28   BUN 31* 32* 33*  CREATININE 1.02 1.09 1.05  GLUCOSE 168* 152* 140*    Electrolytes Recent Labs  Lab 06/05/17 1700 06/06/17 0534 06/06/17 1512  06/09/17 0414 06/09/17 1347 06/09/17 1945  CALCIUM  --  9.2 9.0   < > 8.6* 8.8* 8.4*  MG  2.3 2.2 2.3  --   --   --   --   PHOS 1.7* 2.8 3.2  --   --   --   --    < > = values in this interval not displayed.    CBC Recent Labs  Lab 06/06/17 0534 06/07/17 0415 06/09/17 0414  WBC 18.8* 15.7* 16.8*  HGB 15.6 14.0 15.3  HCT 48.3 43.6 46.1  PLT 163 143* 139*    Coag's No results for input(s): APTT, INR in the last 168 hours.  Sepsis Markers Recent Labs  Lab 06/06/17 0534 06/06/17 1451 06/06/17 1830  LATICACIDVEN  --  1.3 1.5  PROCALCITON <0.10  --   --     ABG Recent Labs  Lab 06/05/17 2305  PHART 7.423  PCO2ART 45.4  PO2ART 69.2*    Liver Enzymes Recent Labs  Lab 06/06/17 1512  AST 31  ALT 20  ALKPHOS 56  BILITOT 1.5*  ALBUMIN 3.5    Cardiac Enzymes No results for input(s): TROPONINI, PROBNP in the last 168 hours.  Glucose Recent Labs  Lab 06/08/17 2320 06/09/17 0323 06/09/17 0739 06/09/17 1102 06/09/17 1556 06/09/17 1923  GLUCAP 167* 136* 130* 141* 133* 129*    Imaging Dg Chest Port 1 View  Result Date: 06/09/2017 CLINICAL DATA:  Leukocytosis. EXAM: PORTABLE CHEST 1 VIEW COMPARISON:  06/07/2017. FINDINGS: Right subclavian catheter tip is in the projection of the cavoatrial junction. Feeding tube tip is below the GE junction. Right lower lobe volume loss and asymmetric elevation of the right hemidiaphragm has increased from previous exam. IMPRESSION: 1. Decreased aeration to the right base with increased asymmetric elevation of the right hemidiaphragm. Electronically Signed   By: Kerby Moors M.D.   On: 06/09/2017 08:46     STUDIES:  N/A  CULTURES: Results for orders placed or performed during the hospital encounter of 06/09/2017  MRSA PCR Screening     Status: None   Collection Time: 05/30/2017  7:48 PM  Result Value Ref Range Status   MRSA by PCR NEGATIVE NEGATIVE Final    Comment:        The GeneXpert MRSA Assay (FDA approved for NASAL specimens only), is one component of a comprehensive MRSA colonization surveillance  program. It is not intended to diagnose MRSA infection nor to guide or monitor treatment for MRSA infections. Performed at [facility], [place] [address] [place], [place] [address]   Culture, blood (Routine X 2) w Reflex to ID Panel     Status: None   Collection Time: 06/04/17 11:21 AM  Result Value Ref Range Status   Specimen Description BLOOD RIGHT HAND  Final   Special Requests   Final    BOTTLES DRAWN AEROBIC AND ANAEROBIC Blood Culture adequate volume   Culture   Final    NO GROWTH 5 DAYS Performed at [facility], [place] [address], [place], [place] [address]    Report Status 06/09/2017 FINAL  Final  Culture, blood (Routine X 2) w Reflex to ID Panel     Status: None   Collection Time: 06/04/17 11:26 AM  Result Value Ref Range Status  Specimen Description BLOOD LEFT HAND  Final   Special Requests   Final    BOTTLES DRAWN AEROBIC AND ANAEROBIC Blood Culture results may not be optimal due to an inadequate volume of blood received in culture bottles   Culture   Final    NO GROWTH 5 DAYS Performed at [facility], [place] [address], [place], [place] [address]    Report Status 06/09/2017 FINAL  Final     ANTIBIOTICS: Anti-infectives (From admission, onward)   Start     Dose/Rate Route Frequency Ordered Stop   06/07/17 0330  vancomycin (VANCOCIN) IVPB 750 mg/150 ml premix  Status:  Discontinued     750 mg 150 mL/hr over 60 Minutes Intravenous Every 12 hours 06/06/17 1536 06/09/17 1524   06/06/17 2330  piperacillin-tazobactam (ZOSYN) IVPB 3.375 g     3.375 g 12.5 mL/hr over 240 Minutes Intravenous Every 8 hours 06/06/17 1536 06/12/17 2359   06/06/17 1530  piperacillin-tazobactam (ZOSYN) IVPB 3.375 g     3.375 g 100 mL/hr over 30 Minutes Intravenous  Once 06/06/17 1455 06/06/17 1553   06/06/17 1530  vancomycin (VANCOCIN) 1,500 mg in sodium chloride 0.9 % 500 mL IVPB     1,500 mg 250 mL/hr over 120 Minutes Intravenous  Once 06/06/17 1455 06/06/17 1723       SIGNIFICANT EVENTS: Increasingly confused and lethargic today.  LINES/TUBES: Drain   External Urinary Catheter 5 days  Rectal Tube/Pouch less than 1 day     CVC Line   CVC Triple Lumen 06/03/17 Right Subclavian 5 days     PIV Line   Peripheral IV 06/09/2017 Left Antecubital 5 days     NG/OG Tube   Nasoenteric Feeding Tube Cortrak - 43 inches Left nare Documented cm marking at nare/ corner of mouth 90 cm 3 days      DISCUSSION: 77 year old man with poor neurological recovery after significant left hemispheric intraparenchymal hemorrhage.  Long-term prognosis at this age with the size of the of the bleed is relatively poor (ICH score 3 - 72% mortality).  Recovery beyond his current level of function is doubtful and will take time. The patient has stipulated that he would not want intubation or CPR.  ASSESSMENT / PLAN:  PULMONARY A: Recurrent aspiration.  Likely cause of the fever as cultures are negative. P:   Continue n.p.o. status.  Maintain head of bed elevated.  Patient would not want a tracheostomy.   CARDIOVASCULAR A:  Remains hypertensive despite current regimen.  P:   Will intensify current antihypertensives Secondary Prevention: ASA on hold  RENAL A:   Mild AKI has now resolved with additional hydration.  High insensitive losses due to fever likely. Volume Status: Euvolemic P:   Continue current fluid strategy Foley: Is required for patient comfort and precisely measure urine output.  GASTROINTESTINAL A:   Nutritional Status: At high nutritional risk P:   Continue nasogastric feeding. Fiber for diarrhea.  HEMATOLOGIC A:   At high DVT risk DVT prophylaxis: Initiate chemical prophylaxis at day 7 post bleed; Transfusion requirements: None P:  As above  INFECTIOUS A:   Persistent fever and leukocytosis despite negative cultures.  Likely chronic sterile aspiration. P:   Complete 7-day course of Zosyn.  However fever is not improved with  antibiotics suggesting a noninfectious cause. Line Removal: remove central line now that patient is off hypertonic saline.  ENDOCRINE A:   No prior history of diabetes.  Stress hyperglycemia. Glycemic control: Acceptable; Stress steroids: None P:  Continue basal bolus insulin for coverage.  NEUROLOGIC A:   Now 6 days post large intra-parenchymal hemorrhage.  Elevated ICH score poor intermediate and long-term prognosis.  Patient has already stipulated limitations on care.  Better today.  P: Aggressive fever control. Decrease neurochecks to provide more restorative sleep.  FAMILY  - Updates:.  Updated family at bedside today.  The seem to understand the severity of his illness and I explained to them the time course and evolution of intracerebral hemorrhages.  They understand that the prognosis is poor regardless of how aggressive care he may receive.  They are hopeful that he will improve with better fever control but admitted clear that the patient would not want a tracheostomy or PEG tube and that likewise he would not wish to be intubated or placed on mechanical ventilation.  He would not wish CPR.   CRITICAL CARE Performed by: Kipp Brood  Total critical care time: 40 minutes  Critical care time was exclusive of separately billable procedures and treating other patients.  Critical care was necessary to treat or prevent imminent or life-threatening deterioration.  Critical care was time spent personally by me on the following activities: development of treatment plan with patient and/or surrogate as well as nursing, discussions with consultants, evaluation of patient's response to treatment, examination of patient, obtaining history from patient or surrogate, ordering and performing treatments and interventions, ordering and review of laboratory studies, ordering and review of radiographic studies, pulse oximetry and re-evaluation of patient's condition.  Kipp Brood, MD  [facility] and [place] Pager: [phone] After hours [phone]  06/09/2017, 11:09 PM

## 2017-06-09 NOTE — Progress Notes (Signed)
[employer] ADULT ICU REPLACEMENT PROTOCOL FOR AM LAB REPLACEMENT ONLY  The patient does apply for the [facility] Adult ICU Electrolyte Replacment Protocol based on the criteria listed below:   1. Is GFR >/= 40 ml/min? Yes.    Patient's GFR today is *>60 2. Is urine output >/= 0.5 ml/kg/hr for the last 6 hours? Yes.   Patient's UOP is 0.7 ml/kg/hr 3. Is BUN < 60 mg/dL? Yes.    Patient's BUN today is 31 4. Abnormal electrolyte(s): K 2.6 5. Ordered repletion with: protocol 6. If a panic level lab has been reported, has the CCM MD in charge been notified? Yes.  .   Physician:  Dr Natasha Bence, [place] A 06/09/2017 5:15 AM

## 2017-06-09 NOTE — Plan of Care (Signed)
Pt tolerates q2 turns well as well as bed in chair position

## 2017-06-10 ENCOUNTER — Inpatient Hospital Stay (HOSPITAL_COMMUNITY): Payer: PPO

## 2017-06-10 DIAGNOSIS — R739 Hyperglycemia, unspecified: Secondary | ICD-10-CM

## 2017-06-10 DIAGNOSIS — R0682 Tachypnea, not elsewhere classified: Secondary | ICD-10-CM

## 2017-06-10 DIAGNOSIS — G4733 Obstructive sleep apnea (adult) (pediatric): Secondary | ICD-10-CM

## 2017-06-10 DIAGNOSIS — D72829 Elevated white blood cell count, unspecified: Secondary | ICD-10-CM

## 2017-06-10 DIAGNOSIS — R6510 Systemic inflammatory response syndrome (SIRS) of non-infectious origin without acute organ dysfunction: Secondary | ICD-10-CM

## 2017-06-10 DIAGNOSIS — T380X5A Adverse effect of glucocorticoids and synthetic analogues, initial encounter: Secondary | ICD-10-CM

## 2017-06-10 DIAGNOSIS — D72820 Lymphocytosis (symptomatic): Secondary | ICD-10-CM

## 2017-06-10 DIAGNOSIS — I69114 Frontal lobe and executive function deficit following nontraumatic intracerebral hemorrhage: Secondary | ICD-10-CM

## 2017-06-10 DIAGNOSIS — G936 Cerebral edema: Secondary | ICD-10-CM

## 2017-06-10 DIAGNOSIS — R000 Tachycardia, unspecified: Secondary | ICD-10-CM

## 2017-06-10 DIAGNOSIS — R509 Fever, unspecified: Secondary | ICD-10-CM

## 2017-06-10 LAB — GLUCOSE, CAPILLARY
GLUCOSE-CAPILLARY: 145 mg/dL — AB (ref 65–99)
GLUCOSE-CAPILLARY: 133 mg/dL — AB (ref 65–99)
GLUCOSE-CAPILLARY: 148 mg/dL — AB (ref 65–99)
Glucose-Capillary: 145 mg/dL — ABNORMAL HIGH (ref 65–99)
Glucose-Capillary: 127 mg/dL — ABNORMAL HIGH (ref 65–99)
Glucose-Capillary: 111 mg/dL — ABNORMAL HIGH (ref 65–99)

## 2017-06-10 LAB — BASIC METABOLIC PANEL
Anion gap: 6 (ref 5–15)
BUN: 35 mg/dL — ABNORMAL HIGH (ref 6–20)
CALCIUM: 8.6 mg/dL — AB (ref 8.9–10.3)
CO2: 29 mmol/L (ref 22–32)
CREATININE: 1.08 mg/dL (ref 0.61–1.24)
Chloride: 114 mmol/L — ABNORMAL HIGH (ref 101–111)
Glucose, Bld: 113 mg/dL — ABNORMAL HIGH (ref 65–99)
Potassium: 3.6 mmol/L (ref 3.5–5.1)
Sodium: 149 mmol/L — ABNORMAL HIGH (ref 135–145)

## 2017-06-10 LAB — CBC WITH DIFFERENTIAL/PLATELET
Abs Immature Granulocytes: 0.4 10*3/uL — ABNORMAL HIGH (ref 0.0–0.1)
BASOS ABS: 0.1 10*3/uL (ref 0.0–0.1)
BASOS PCT: 1 %
EOS ABS: 0.8 10*3/uL — AB (ref 0.0–0.7)
Eosinophils Relative: 5 %
HCT: 43.1 % (ref 39.0–52.0)
Hemoglobin: 14.1 g/dL (ref 13.0–17.0)
Immature Granulocytes: 2 %
Lymphocytes Relative: 26 %
Lymphs Abs: 4.4 10*3/uL — ABNORMAL HIGH (ref 0.7–4.0)
MCH: 32.3 pg (ref 26.0–34.0)
MCHC: 32.7 g/dL (ref 30.0–36.0)
MCV: 98.6 fL (ref 78.0–100.0)
MONO ABS: 1.2 10*3/uL — AB (ref 0.1–1.0)
MONOS PCT: 7 %
Neutro Abs: 10.3 10*3/uL — ABNORMAL HIGH (ref 1.7–7.7)
Neutrophils Relative %: 59 %
PLATELETS: 136 10*3/uL — AB (ref 150–400)
RBC: 4.37 MIL/uL (ref 4.22–5.81)
RDW: 12.7 % (ref 11.5–15.5)
WBC: 17.2 10*3/uL — ABNORMAL HIGH (ref 4.0–10.5)

## 2017-06-10 MED ORDER — ALLOPURINOL 100 MG PO TABS
50.0000 mg | ORAL_TABLET | Freq: Every day | ORAL | Status: DC
  Administered 2017-06-10 – 2017-06-13 (×4): 50 mg
  Filled 2017-06-10 (×4): qty 0.5

## 2017-06-10 NOTE — Care Management Important Message (Signed)
Important Message  Patient Details  Name: Jason Callahan MRN: [id number] Date of Birth: 01/12/1941   Medicare Important Message Given:  Yes    Barb Merino Vanderbilt 06/10/2017, 12:47 PM

## 2017-06-10 NOTE — Progress Notes (Signed)
PULMONARY / CRITICAL CARE MEDICINE   Name: Jason Callahan MRN: [id number] DOB: 05-Feb-1940    ADMISSION DATE:  06/10/2017 CONSULTATION DATE:  06/06/2017   REFERRING MD:  Erlinda Hong  CHIEF COMPLAINT:  Fever and increasing respiratory distress following ICH  HISTORY OF PRESENT ILLNESS:   77 year old male with past medical history significant for OSA, GERD, PVA, HTN, and left ICA s/p CEA admitted on 5/19 with stroke.  Presented with acute hypertension, right sided weakness, facial droop and expressive aphasia. Found to have large right frontal hemorrhage with resultant global aphasia and RUE plegia. NSGY was consulted without recommendations for surgical intervention. He was placed on hypertonic saline  He is sitting up in bed with his eyes open removing the left upper extremity purposefully this morning.  He is not at all interactive with myself or with therapist.  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  PAST SURGICAL HISTORY: He  has a past surgical history that includes Hernia repair (Left) and CEA (2000).  No Known Allergies  No current facility-administered medications on file prior to encounter.    Current Outpatient Medications on File Prior to Encounter  Medication Sig  . allopurinol (ZYLOPRIM) 300 MG tablet Take 300 mg by mouth daily.   . cholecalciferol (VITAMIN D) 1000 UNITS tablet Take 1,000 Units by mouth daily.  . cyanocobalamin 1000 MCG tablet Take 1,000 mcg by mouth daily.  . irbesartan-hydrochlorothiazide (AVALIDE) 300-12.5 MG per tablet Take 0.5 tablets by mouth daily.   . Multiple Vitamin (MULTIVITAMIN) tablet Take 1 tablet by mouth daily. Centrum Silver  . rosuvastatin (CRESTOR) 20 MG  tablet Take 20 mg by mouth daily.    FAMILY HISTORY:  His indicated that his mother is deceased. He indicated that his father is deceased. He indicated that his sister is alive. He indicated that his brother is alive.   SOCIAL HISTORY: He  reports that he has never smoked. He has never used smokeless tobacco. He reports that he drinks about 4.2 oz of alcohol per week. He reports that he does not use drugs.  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  SUBJECTIVE:  According to family he is more confused and agitated today with rising fever.  VITAL SIGNS: BP 107/63   Pulse 84   Temp 99.9 F (37.7 C) (Axillary)   Resp (!) 21   Ht 5\' 10"  (1.778 m)   Wt 180 lb 5.4 oz (81.8 kg)   SpO2 95%   BMI 25.88 kg/m   HEMODYNAMICS:  Hypertensive on no infusions  VENTILATOR SETTINGS:  Currently not intubated.  DO NOT INTUBATE as per family/patient's request.  INTAKE / OUTPUT: I/O last 3 completed shifts: In: 5042.2 [I.V.:1367.2; NG/GT:3175; IV Piggyback:500] Out: 4010 [Urine:2620; Stool:925]  PHYSICAL EXAMINATION: General: Sitting up in bed purposeful with the left upper extremity but not at all interactive  Neuro: No response to voice.  Eyes open pupils equal not tracking.  Spontaneously moving left upper extremity, no movement of right extremities even with noxious stimulation. HEENT: Bridled cortrak tube in place.  No nasal damage Cardiovascular: S1 and S2 are regular without murmur rub or gallop Lungs: Abrasions are unlabored, there is symmetric air movement no wheezes, no rhonchi  Abdomen: Abdomen is soft without any organomegaly masses  or tenderness Musculoskeletal: No further left great toe tenderness  Skin: Skin fragility with ecchymoses.  LABS:  BMET Recent Labs  Lab 06/09/17 1347 06/09/17 1945 06/10/17 0516  NA 150* 148* 149*  K 4.1 3.9 3.6  CL 112* 113* 114*  CO2 28 28 29   BUN 32* 33* 35*  CREATININE 1.09 1.05 1.08   GLUCOSE 152* 140* 113*    Electrolytes Recent Labs  Lab 06/05/17 1700 06/06/17 0534 06/06/17 1512  06/09/17 1347 06/09/17 1945 06/10/17 0516  CALCIUM  --  9.2 9.0   < > 8.8* 8.4* 8.6*  MG 2.3 2.2 2.3  --   --   --   --   PHOS 1.7* 2.8 3.2  --   --   --   --    < > = values in this interval not displayed.    CBC Recent Labs  Lab 06/07/17 0415 06/09/17 0414 06/10/17 0516  WBC 15.7* 16.8* 17.2*  HGB 14.0 15.3 14.1  HCT 43.6 46.1 43.1  PLT 143* 139* 136*    Coag's No results for input(s): APTT, INR in the last 168 hours.  Sepsis Markers Recent Labs  Lab 06/06/17 0534 06/06/17 1451 06/06/17 1830  LATICACIDVEN  --  1.3 1.5  PROCALCITON <0.10  --   --     ABG Recent Labs  Lab 06/05/17 2305  PHART 7.423  PCO2ART 45.4  PO2ART 69.2*    Liver Enzymes Recent Labs  Lab 06/06/17 1512  AST 31  ALT 20  ALKPHOS 56  BILITOT 1.5*  ALBUMIN 3.5    Cardiac Enzymes No results for input(s): TROPONINI, PROBNP in the last 168 hours.  Glucose Recent Labs  Lab 06/09/17 1102 06/09/17 1556 06/09/17 1923 06/09/17 2320 06/10/17 0314 06/10/17 0813  GLUCAP 141* 133* 129* 101* 145* 145*    Imaging Ct Head Wo Contrast  Result Date: 06/10/2017 CLINICAL DATA:  Follow-up of intracranial hemorrhage EXAM: CT HEAD WITHOUT CONTRAST TECHNIQUE: Contiguous axial images were obtained from the base of the skull through the vertex without intravenous contrast. COMPARISON:  06/06/2017 FINDINGS: Brain: Intraparenchymal hematoma centered in the left frontal lobe is unchanged in size, with slight shift in configuration. Decreased amount of blood in the right lateral ventricle. Small amount of extra-axial blood over the left convexity is unchanged. At the level of the foramina [place], there is rightward midline shift 5 mm unchanged. There is no new site of hemorrhage. Vascular: No abnormal hyperdensity of the major intracranial arteries or dural venous sinuses. No intracranial  atherosclerosis. Skull: The visualized skull base, calvarium and extracranial soft tissues are normal. Sinuses/Orbits: No fluid levels or advanced mucosal thickening of the visualized paranasal sinuses. No mastoid or middle ear effusion. The orbits are normal. IMPRESSION: Expected evolution of intraparenchymal hematoma centered in the left frontal lobe, without increase in size or new site of hemorrhage. Unchanged 5 mm rightward midline shift with small volume extra-axial blood over the left convexity. Intraventricular blood is decreased. Electronically Signed   By: Ulyses Jarred M.D.   On: 06/10/2017 04:30     STUDIES:  N/A  CULTURES: Results for orders placed or performed during the hospital encounter of 06/11/2017  MRSA PCR Screening     Status: None   Collection Time: 06/13/2017  7:48 PM  Result Value Ref Range Status   MRSA by PCR NEGATIVE NEGATIVE Final    Comment:        The GeneXpert MRSA Assay (FDA approved for NASAL specimens only), is one component of  a comprehensive MRSA colonization surveillance program. It is not intended to diagnose MRSA infection nor to guide or monitor treatment for MRSA infections. Performed at [facility], [place] [address], [place], [place] [address]   Culture, blood (Routine X 2) w Reflex to ID Panel     Status: None   Collection Time: 06/04/17 11:21 AM  Result Value Ref Range Status   Specimen Description BLOOD RIGHT HAND  Final   Special Requests   Final    BOTTLES DRAWN AEROBIC AND ANAEROBIC Blood Culture adequate volume   Culture   Final    NO GROWTH 5 DAYS Performed at [facility], [place] [address] [place], [place] [address]    Report Status 06/09/2017 FINAL  Final  Culture, blood (Routine X 2) w Reflex to ID Panel     Status: None   Collection Time: 06/04/17 11:26 AM  Result Value Ref Range Status   Specimen Description BLOOD LEFT HAND  Final   Special Requests   Final    BOTTLES DRAWN AEROBIC AND ANAEROBIC Blood Culture  results may not be optimal due to an inadequate volume of blood received in culture bottles   Culture   Final    NO GROWTH 5 DAYS Performed at [facility], [place] [address] [place], [place] [address]    Report Status 06/09/2017 FINAL  Final     ANTIBIOTICS: Anti-infectives (From admission, onward)   Start     Dose/Rate Route Frequency Ordered Stop   06/07/17 0330  vancomycin (VANCOCIN) IVPB 750 mg/150 ml premix  Status:  Discontinued     750 mg 150 mL/hr over 60 Minutes Intravenous Every 12 hours 06/06/17 1536 06/09/17 1524   06/06/17 2330  piperacillin-tazobactam (ZOSYN) IVPB 3.375 g     3.375 g 12.5 mL/hr over 240 Minutes Intravenous Every 8 hours 06/06/17 1536 06/12/17 2359   06/06/17 1530  piperacillin-tazobactam (ZOSYN) IVPB 3.375 g     3.375 g 100 mL/hr over 30 Minutes Intravenous  Once 06/06/17 1455 06/06/17 1553   06/06/17 1530  vancomycin (VANCOCIN) 1,500 mg in sodium chloride 0.9 % 500 mL IVPB     1,500 mg 250 mL/hr over 120 Minutes Intravenous  Once 06/06/17 1455 06/06/17 1723      SIGNIFICANT EVENTS: Increasingly confused and lethargic today.  LINES/TUBES: Drain   External Urinary Catheter 5 days  Rectal Tube/Pouch less than 1 day     CVC Line   CVC Triple Lumen 06/03/17 Right Subclavian 5 days     PIV Line   Peripheral IV 05/17/2017 Left Antecubital 5 days     NG/OG Tube   Nasoenteric Feeding Tube Cortrak - 43 inches Left nare Documented cm marking at nare/ corner of mouth 90 cm 3 days      DISCUSSION: 77 year old man with poor neurological recovery after significant left hemispheric intraparenchymal hemorrhage.  Long-term prognosis at this age with the size of the of the bleed is relatively poor (ICH score 3 - 72% mortality).  Recovery beyond his current level of function is doubtful and will take time. The patient has stipulated that he would not want intubation or CPR.  ASSESSMENT / PLAN:  PULMONARY A: Recurrent aspiration.  Likely  cause of the fever as cultures are negative. P:   Continue n.p.o. status.  Maintain head of bed elevated.  Patient would not want a tracheostomy.   CARDIOVASCULAR A:  Remains hypertensive despite current regimen.  P:   Will intensify current antihypertensives Secondary  Prevention: ASA on hold  RENAL A:   Mild AKI has now resolved with additional hydration.  High insensitive losses due to fever likely. Volume Status: Euvolemic P:   Continue current fluid strategy   GASTROINTESTINAL A:   Nutritional Status: At high nutritional risk P:   Continue nasogastric feeding. Fiber for diarrhea.  HEMATOLOGIC A:   At high DVT risk DVT prophylaxis: Initiate chemical prophylaxis at day 7 post bleed; Transfusion requirements: None P:  As above  INFECTIOUS A:   Persistent fever and leukocytosis despite negative cultures.  Likely chronic sterile aspiration. P:   Complete 7-day course of Zosyn.  However fever is not improved with antibiotics suggesting a noninfectious cause.   ENDOCRINE A:   No prior history of diabetes.  Stress hyperglycemia. Glycemic control: Acceptable; Stress steroids: None P:   Continue basal bolus insulin for coverage.  NEUROLOGIC A:   This is day 7 status post large intraparenchymal hemorrhage with persistent global aphasia.  Dr. Leonie Man is discussing prognosis with the patient's wife as I dictate.  She is already told us that extremely aggressive life support measures would not be appropriate.    Lars Masson, MD [place] Pager: [phone] After hours [phone]  06/10/2017, 9:25 AM

## 2017-06-10 NOTE — Progress Notes (Signed)
  Speech Language Pathology Treatment: Dysphagia  Patient Details Name: Jason Callahan MRN: [id number] DOB: 1940-07-04 Today's Date: 06/10/2017 Time: 6754-4920 SLP Time Calculation (min) (ACUTE ONLY): 18 min  Assessment / Plan / Recommendation Clinical Impression  Pt is more arousable today, but still lethargic, inattentive and minimally purposeful in externally directed tasks. Pt observed to sustain eye contact for brief periods, held washcloth and rubbed face for 5 seconds, grasped flashlight and turned it on, tracked newspaper to midline. Despite max cues there were no efforts to communicate with facial expression, gestures or articulation. Attempted to call pts attention to PO by placing his hand on a cup and offering ice via spoon with visual verbal and tactile cues with no meaningful response from pt. Will continue efforts at high frequency this week to see if there is potential to resume PO and assist in plan of care.   HPI        SLP Plan  Continue with current plan of care       Recommendations  Diet recommendations: NPO                Plan: Continue with current plan of care       GO               [facility], [place] CCC-SLP [phone]  Lynann Beaver 06/10/2017, 9:32 AM

## 2017-06-10 NOTE — Progress Notes (Signed)
Physical Therapy Treatment Patient Details Name: Jason Callahan MRN: [id number] DOB: November 03, 1940 Today's Date: 06/10/2017    History of Present Illness This 77 y.o. male admitted with acute onset Rt sided weakness, Rt facial droop, and aphasia.   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  PMH includes:  PVD, LVH, Gout. s/p CEA.      PT Comments    Pt making slow improvement, limited by his recent fever and lethargy.  Today  he was alert for the whole treatment, delayed in his responses and variable in his attention to task.  Emphasis on sitting EOB, balancing and working on following simple commands in sitting and standing with pivot transfer.   Follow Up Recommendations  CIR;Supervision/Assistance - 24 hour     Equipment Recommendations  Other (comment)(TBA)    Recommendations for Other Services       Precautions / Restrictions Precautions Precautions: Fall    Mobility  Bed Mobility Overal bed mobility: Needs Assistance Bed Mobility: Rolling;Sidelying to Sit Rolling: Mod assist Sidelying to sit: Max assist       General bed mobility comments: assist to bend Rt knee and assist provided at shoulder and knee to roll to Lt.  He spontaneously assisted with pushing trunk up to sitting with Rt UE once movement was initiated for him   Transfers Overall transfer level: Needs assistance Equipment used: None Transfers: Sit to/from [employer] Transfers Sit to Stand: +2 safety/equipment;Max assist Stand pivot transfers: Max assist;+2 physical assistance;+2 safety/equipment       General transfer comment: assist to power up into standing, as well as assist to pivot to the Lt.  He does not attempt to advance Rt LE during transfer, and requires assist to maintain balance   Ambulation/Gait                 Stairs             Wheelchair Mobility    Modified Rankin (Stroke Patients Only) Modified Rankin (Stroke Patients  Only) Pre-Morbid Rankin Score: No symptoms Modified Rankin: Severe disability     Balance Overall balance assessment: Needs assistance Sitting-balance support: Feet supported Sitting balance-Leahy Scale: Poor Sitting balance - Comments: Pt sat EOB with mod A, occasional max A due to pushing Rt and posteriorly, and brief periods of min A  Postural control: Posterior lean;Right lateral lean Standing balance support: No upper extremity supported Standing balance-Leahy Scale: Poor Standing balance comment: stands with max A +1.  Buckling of Rt LE noted                             Cognition Arousal/Alertness: Awake/alert Behavior During Therapy: Flat affect Overall Cognitive Status: Impaired/Different from baseline Area of Impairment: Attention                   Current Attention Level: Focused           General Comments: Pt will briefly look to therapist or family member, but doesn't consistently visually fxate on people/objects.  He spontaneously turned flashlight on and off, then perseverated on doing so.  difficulty to ascertain if he did this to command due to perseveration.  He did nod "yes" when asked if he wanted to stand up.        Exercises      General Comments General comments (skin integrity, edema, etc.): VSS.  Family present.  They tend to  provide a lot of auditory stimulation and repeat requests rapidly.  Instructed them to provide one command/question at a time and wait at least 10 seconds before cuing him again       Pertinent Vitals/Pain Pain Assessment: Faces Faces Pain Scale: No hurt    Home Living                      Prior Function            PT Goals (current goals can now be found in the care plan section) Acute Rehab PT Goals PT Goal Formulation: With patient Time For Goal Achievement: 06/18/17 Potential to Achieve Goals: Good Progress towards PT goals: Progressing toward goals    Frequency    Min  4X/week      PT Plan Current plan remains appropriate    Co-evaluation PT/OT/SLP Co-Evaluation/Treatment: Yes Reason for Co-Treatment: Complexity of the patient's impairments (multi-system involvement) PT goals addressed during session: Mobility/safety with mobility        AM-PAC PT "6 Clicks" Daily Activity  Outcome Measure  Difficulty turning over in bed (including adjusting bedclothes, sheets and blankets)?: Unable Difficulty moving from lying on back to sitting on the side of the bed? : Unable Difficulty sitting down on and standing up from a chair with arms (e.g., wheelchair, bedside commode, etc,.)?: Unable Help needed moving to and from a bed to chair (including a wheelchair)?: A Lot Help needed walking in hospital room?: Total Help needed climbing 3-5 steps with a railing? : Total 6 Click Score: 7    End of Session   Activity Tolerance: Patient tolerated treatment well Patient left: in chair;with call bell/phone within reach;with chair alarm set;with family/visitor present Nurse Communication: Mobility status PT Visit Diagnosis: Other abnormalities of gait and mobility (R26.89);Hemiplegia and hemiparesis Hemiplegia - Right/Left: Right Hemiplegia - dominant/non-dominant: Dominant Hemiplegia - caused by: Nontraumatic intracerebral hemorrhage     Time: 7342-8768 PT Time Calculation (min) (ACUTE ONLY): 42 min  Charges:  $Therapeutic Activity: 8-22 mins $Neuromuscular Re-education: 8-22 mins                    G Codes:       27-Jun-2017  Donnella Sham, PT [phone]  (pager)   [name] Troyce Gieske 06/27/2017, 5:51 PM

## 2017-06-10 NOTE — Progress Notes (Signed)
Occupational Therapy Treatment Patient Details Name: Jason Callahan MRN: [id number] DOB: 1940/02/14 Today's Date: 06/10/2017    History of present illness This 77 y.o. male admitted with acute onset Rt sided weakness, Rt facial droop, and aphasia.   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  PMH includes:  PVD, LVH, Gout. s/p CEA.     OT comments  Pt seen with PT.  He was alert throughout session.  He does not attempt to reach for items when cued to do so, and doesn't spontaneously attempt to manipulate items in his environment.  However, when flashlight placed in his Lt hand, he spontaneously turned it on/off multiple times.  Unable to determine if he did this to command as he appeared to perseverate on the behavior.   No active movement noted Rt UE.  When Rt UE placed on his Lt hand, he did not appear to have any awareness of it.  He briefly visually fixated on his wife and therapist x 4.  He requires total A for ADLs, mod A, overall for EOB sitting, and max A +2 for functional transfers.  If his activity tolerance improves, feel he would benefit from CIR level therapies to due to the severity of his communication and motor deficits.  He will benefit from a team approach and therapists, nsg staff with experience with CVA.   Follow Up Recommendations  CIR;Supervision/Assistance - 24 hour    Equipment Recommendations  None recommended by OT    Recommendations for Other Services Rehab consult    Precautions / Restrictions Precautions Precautions: Fall       Mobility Bed Mobility Overal bed mobility: Needs Assistance Bed Mobility: Rolling;Sidelying to Sit Rolling: Mod assist Sidelying to sit: Max assist       General bed mobility comments: assist to bend Rt knee and assist provided at shoulder and knee to roll to Lt.  He spontaneously assisted with pushing trunk up to sitting with Rt UE once movement was initiated for him   Transfers Overall  transfer level: Needs assistance Equipment used: None Transfers: Sit to/from [employer] Transfers Sit to Stand: +2 safety/equipment;Max assist Stand pivot transfers: Max assist;+2 physical assistance;+2 safety/equipment       General transfer comment: assist to power up into standing, as well as assist to pivot to the Lt.  He does not attempt to advance Rt LE during transfer, and requires assist to maintain balance     Balance Overall balance assessment: Needs assistance Sitting-balance support: Feet supported Sitting balance-Leahy Scale: Poor Sitting balance - Comments: Pt sat EOB with mod A, occasional max A due to pushing Rt and posteriorly, and brief periods of min A  Postural control: Posterior lean;Right lateral lean Standing balance support: No upper extremity supported Standing balance-Leahy Scale: Poor Standing balance comment: stands with max A +1.  Buckling of Rt LE noted                            ADL either performed or assessed with clinical judgement   ADL Overall ADL's : Needs assistance/impaired                         Toilet Transfer: Maximal assistance;+2 for safety/equipment;Stand-pivot           Functional mobility during ADLs: Maximal assistance;+2 for safety/equipment;+2 for physical assistance General ADL Comments: total A for all aspects of ADLs  Vision   Additional Comments: Pt does not appear to scan to the Rt    Perception     Praxis      Cognition Arousal/Alertness: Awake/alert Behavior During Therapy: Flat affect Overall Cognitive Status: Impaired/Different from baseline Area of Impairment: Attention                   Current Attention Level: Focused           General Comments: Pt will briefly look to therapist or family member, but doesn't consistently visually fxate on people/objects.  He spontaneously turned flashlight on and off, then perseverated on doing so.  difficulty to ascertain if  he did this to command due to perseveration.  He did nod "yes" when asked if he wanted to stand up.          Exercises     Shoulder Instructions       General Comments VSS.  Family present.  They tend to provide a lot of auditory stimulation and repeat requests rapidly.  Instructed them to provide one command/question at a time and wait at least 10 seconds before cuing him again     Pertinent Vitals/ Pain       Pain Assessment: Faces Faces Pain Scale: No hurt  Home Living                                          Prior Functioning/Environment              Frequency  Min 2X/week        Progress Toward Goals  OT Goals(current goals can now be found in the care plan section)  Progress towards OT goals: Progressing toward goals     Plan Discharge plan remains appropriate    Co-evaluation    PT/OT/SLP Co-Evaluation/Treatment: Yes Reason for Co-Treatment: Complexity of the patient's impairments (multi-system involvement);For patient/therapist safety;To address functional/ADL transfers   OT goals addressed during session: ADL's and self-care;Strengthening/ROM      AM-PAC PT "6 Clicks" Daily Activity     Outcome Measure   Help from another person eating meals?: Total Help from another person taking care of personal grooming?: Total Help from another person toileting, which includes using toliet, bedpan, or urinal?: A Lot Help from another person bathing (including washing, rinsing, drying)?: Total Help from another person to put on and taking off regular upper body clothing?: Total Help from another person to put on and taking off regular lower body clothing?: Total 6 Click Score: 7    End of Session Equipment Utilized During Treatment: Oxygen  OT Visit Diagnosis: Hemiplegia and hemiparesis;Cognitive communication deficit (R41.841) Symptoms and signs involving cognitive functions: Nontraumatic intracerebral hemorrhage Hemiplegia - Right/Left:  Right Hemiplegia - dominant/non-dominant: Dominant Hemiplegia - caused by: Nontraumatic intracerebral hemorrhage   Activity Tolerance Patient tolerated treatment well   Patient Left in chair;with call bell/phone within reach;with chair alarm set;with family/visitor present;with restraints reapplied   Nurse Communication Mobility status        Time: 1610-9604 OT Time Calculation (min): 42 min  Charges: OT General Charges $OT Visit: 1 Visit OT Treatments $Neuromuscular Re-education: 8-22 mins  [facility], OTR/L [phone]    Lucille Passy M 06/10/2017, 1:29 PM

## 2017-06-10 NOTE — Progress Notes (Signed)
Inpatient Rehabilitation Admissions Coordinator  I will follow pt's progress with medically and with therapy to begin discussions with his wife concerning his rehab venue options.   Danne Baxter, RN, MSN Rehab Admissions Coordinator [phone] 06/10/2017 2:56 PM

## 2017-06-10 NOTE — Progress Notes (Signed)
STROKE TEAM PROGRESS NOTE   SUBJECTIVE (INTERVAL HISTORY) He patient's wife, Jason Callahan and speech therapist all at the bedside. The patient appeared quite lethargic. Jason Callahan discussed the patient's poor prognosis.  OBJECTIVE Temp:  [98.2 F (36.8 C)-101.1 F (38.4 C)] 98.2 F (36.8 C) (05/27 1200) Pulse Rate:  [74-115] 76 (05/27 1100) Cardiac Rhythm: Normal sinus rhythm (05/27 0800) Resp:  [14-29] 23 (05/27 1100) BP: (104-180)/(55-107) 133/71 (05/27 1327) SpO2:  [95 %-100 %] 99 % (05/27 1100) Weight:  [180 lb 5.4 oz (81.8 kg)] 180 lb 5.4 oz (81.8 kg) (05/27 0300)  Recent Labs  Lab 06/09/17 1923 06/09/17 2320 06/10/17 0314 06/10/17 0813 06/10/17 1151  GLUCAP 129* 101* 145* 145* 127*   Recent Labs  Lab 06/05/17 1235 06/05/17 1700  06/06/17 0534  06/06/17 1512  06/08/17 0622  06/08/17 1746 06/09/17 0023 06/09/17 0414 06/09/17 1347 06/09/17 1945 06/10/17 0516  NA 158* 157*   < > 159*  160*   < > 160*   < > 157*   < >  --  155* 149* 150* 148* 149*  K  --   --   --  3.2*  --  3.9   < > 2.7*  --  2.8*  --  2.6* 4.1 3.9 3.6  CL  --   --   --  123*  --  124*   < > 120*  --   --   --  112* 112* 113* 114*  CO2  --   --   --  28  --  28   < > 28  --   --   --  30 28 28 29   GLUCOSE  --   --   --  164*  --  159*   < > 224*  --   --   --  168* 152* 140* 113*  BUN  --   --   --  35*  --  45*   < > 34*  --   --   --  31* 32* 33* 35*  CREATININE  --   --   --  1.27*  --  1.40*   < > 1.08  --   --   --  1.02 1.09 1.05 1.08  CALCIUM  --   --   --  9.2  --  9.0   < > 8.7*  --   --   --  8.6* 8.8* 8.4* 8.6*  MG 2.3 2.3  --  2.2  --  2.3  --   --   --   --   --   --   --   --   --   PHOS 2.6 1.7*  --  2.8  --  3.2  --   --   --   --   --   --   --   --   --    < > = values in this interval not displayed.   Recent Labs  Lab 06/06/17 1512  AST 31  ALT 20  ALKPHOS 56  BILITOT 1.5*  PROT 6.1*  ALBUMIN 3.5   Recent Labs  Lab 06/05/17 0546 06/06/17 0534 06/07/17 0415  06/09/17 0414 06/10/17 0516  WBC 18.8* 18.8* 15.7* 16.8* 17.2*  NEUTROABS 12.9* 11.9* 11.0* 10.5* 10.3*  HGB 14.9 15.6 14.0 15.3 14.1  HCT 45.7 48.3 43.6 46.1 43.1  MCV 100.0 101.7* 100.2* 98.3 98.6  PLT 183 163 143* 139* 136*   No  results for input(s): CKTOTAL, CKMB, CKMBINDEX, TROPONINI in the last 168 hours. No results for input(s): LABPROT, INR in the last 72 hours. Recent Labs    06/09/17 1345  COLORURINE YELLOW  LABSPEC 1.018  PHURINE 6.0  GLUCOSEU NEGATIVE  HGBUR SMALL*  BILIRUBINUR NEGATIVE  KETONESUR NEGATIVE  PROTEINUR NEGATIVE  NITRITE NEGATIVE  LEUKOCYTESUR NEGATIVE       Component Value Date/Time   CHOL 111 06/03/2017 0722   TRIG 321 (H) 06/07/2017 0737   HDL 46 06/03/2017 0722   CHOLHDL 2.4 06/03/2017 0722   VLDL 23 06/03/2017 0722   LDLCALC 42 06/03/2017 0722   Lab Results  Component Value Date   HGBA1C 5.3 05/28/2017   No results found for: LABOPIA, COCAINSCRNUR, LABBENZ, AMPHETMU, THCU, LABBARB  No results for input(s): ETH in the last 168 hours.  IMAGING  Ct Soft Tissue Neck W Contrast 05/28/2017 IMPRESSION:  Negative for mass or adenopathy in the neck  Occluded left internal carotid artery.  Right carotid endarterectomy is patent.   Ct Chest W Contrast 05/29/2017 IMPRESSION:  1. No evidence of lymphadenopathy, splenomegaly or metastatic disease.  2. No acute abnormalities.  3. Nonobstructing RIGHT renal calculi  4. Moderate to marked prostate enlargement  5. Coronary artery and Aortic Atherosclerosis (ICD10-I70.0).   Ct Head Code Stroke Wo Contrast 05/16/2017 IMPRESSION:  1. Large hemorrhage in the left frontal lobe, 61 mL. Differential diagnostic considerations include amyloid, hypertension, vascular malformation. Hemorrhagic stroke not considered likely with acute onset of symptoms.  2. ASPECTS is 10 3.    Ct Angio Head W Or Wo Contrast Ct Angio Neck W Or Wo Contrast 06/10/2017 IMPRESSION:  1. Large left frontal hematoma. No  evidence of underlying vascular malformation or tumor  2. Right carotid endarterectomy widely patent  3. 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 Left cavernous carotid is patent but severely stenotic.    Ct Head Wo Contrast 06/03/2017 IMPRESSION:  1. Expanding left frontal hematoma with increased mass effect as described.  2. Increasing mass effect on the left lateral ventricle without intraventricular hemorrhage or downward herniation.    [name] Wo Contrast 06/03/2017 IMPRESSION:  1. Large left frontal lobe hematoma, unchanged from today's earlier CT. Unchanged mass effect with 12 mm of midline shift. No evidence of underlying mass. No chronic hemorrhages to suggest cerebral amyloid angiopathy.  2. Small volume adjacent subarachnoid hemorrhage and trace intraventricular hemorrhage. No evidence of hydrocephalus.    Ct Head Wo Contrast 06/04/2017 IMPRESSION:  Large intraparenchymal hemorrhage in the left frontal lobe slightly smaller, likely due to decompression the ventricles. Small amount of ventricular hemorrhage noted.  Subarachnoid hemorrhage on the left is unchanged 6.5 mm midline shift towards the right is unchanged.   Ct Head Wo Contrast 06/06/2017 IMPRESSION:  1. Evolving LEFT frontal lobe hematoma with similar intraventricular extension.  2. 7 mm LEFT-to-RIGHT midline shift. No ventricular entrapment or hydrocephalus.  3. Similar small volume subarachnoid hemorrhage.    Ct Head Wo Contrast 06/10/2017 IMPRESSION: Expected evolution of intraparenchymal hematoma centered in the left frontal lobe, without increase in size or new site of hemorrhage. Unchanged 5 mm rightward midline shift with small volume extra-axial blood over the left convexity. Intraventricular blood is decreased.    Dg Chest Port 1 View 06/03/2017 IMPRESSION:  The new right central line is in good position.  No pneumothorax.   Dg Chest Port 1  View 06/04/2017 IMPRESSION:  Mild pulmonary vascular congestion without overt edema,  unchanged.   Dg Chest Port 1 View 06/05/2017 IMPRESSION:  1. Low lung volumes with suspected tiny right effusion. Increasing atelectasis at both lung bases  2. Stable vascular congestion  3. Esophageal tube tip is in the left upper quadrant   Dg Chest Port 1 View 06/07/2017 IMPRESSION: No active disease.  Dg Chest Port 1 View 06/09/2017 IMPRESSION: 1. Decreased aeration to the right base with increased asymmetric elevation of the right hemidiaphragm.    TTE - Left ventricle: The cavity size was normal. Wall thickness was   increased in a pattern of mild LVH. Systolic function was   vigorous. The estimated ejection fraction was in the range of 65%   to 70%. Wall motion was normal; there were no regional wall   motion abnormalities. - Aortic valve: Trileaflet; mildly thickened, moderately calcified   leaflets. Moderate calcification involving the noncoronary cusp. - Pulmonary arteries: Systolic pressure was moderately increased.   PA peak pressure: 51 mm Hg (S).   PHYSICAL EXAM  Vitals:   06/10/17 1000 06/10/17 1100 06/10/17 1200 06/10/17 1327  BP: 134/71 131/77  133/71  Pulse: 80 76    Resp: (!) 21 (!) 23    Temp:   98.2 F (36.8 C)   TempSrc:   Axillary   SpO2: 100% 99%    Weight:      Height:         Temp:  [98.2 F (36.8 C)-101.1 F (38.4 C)] 98.2 F (36.8 C) (05/27 1200) Pulse Rate:  [74-115] 76 (05/27 1100) Resp:  [14-29] 23 (05/27 1100) BP: (104-180)/(55-107) 133/71 (05/27 1327) SpO2:  [95 %-100 %] 99 % (05/27 1100) Weight:  [180 lb 5.4 oz (81.8 kg)] 180 lb 5.4 oz (81.8 kg) (05/27 0300)  General - Well nourished, well developed, lethargic. Opens eyes to stim  Ophthalmologic - fundi not visualized due to noncooperation.  Cardiovascular - Regular rate and rhythm.  Neuro - lethargic but more awake alert than yesterday, opens eyes on stim and voice. Blinks on the left  not on th right.  Global aphasia, not following commands. Left pupil round 2.27mm, right surgical pupil dilated with irregular sharp, left gaze preference, not cross midline. Blinking to visual threat on the left but not on the right. Right facial droop and tongue midline in mouth. LUE spontaneous movement against gravity able to hold up on exam. LLE purposeful movement, able to against gravity. RUE flaccid and RLE 1/5 withdraw on pain stimulation. DTR 1+ and right babinski positive. Sensation, coordination and gait not tested.   ASSESSMENT/PLAN Jason Callahan is a 77 y.o. 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 No tPA given due to [place].    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   Ongoing aggressive stroke  risk factor management  Therapy recommendations:  CIR  Disposition:  pending  Cerebral edema  CT showed left frontal ICH with mass effect  off 3% and now on NS  Na Q6h, goal 150-155  Na 141->144->153->158->159->158-> 156-> 149  CT repeat 06/06/17 stable hematoma and midline shift  CT repeat  Monday morning but stat CT if any neuro changes.  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  Leukocytosis following with oncology  WBC 18.3->19.5->18.8->18.8->15.7->16.8-> 17.2  Pan CT negative so far  Continue outpt follow up with oncology  Hypertension and tachycardia Stable  BP goal < 180 - relax BP goal today  On/off cleviprex  on losartan 100mg  daily, amlodipine 10, and metoprolol 50 bid  Elevated TG - relaxed BP goal today trying to be off cleviprex  Close monitoring  Hyperlipidemia  Home meds:  crestor   LDL 42, goal < 70  Hold off statin for now due to acute ICH  Dysphagia  Cortrak placed  On TF @ 25  Other Stroke Risk Factors  Advanced age  PVD  Obstructive sleep apnea, on CPAP at home  Other Active Problems  Hyperglycemia  Hypokalemia - K 3.2->3.2->3.6 - supplement -> 2.7->2.6 ->4.1->3.9-> 3.6  supplement ordered - likely loss from diarrhea   Diarrhea per nursing - secondary to TF -> rectal tube and imodium prn  Check CXR ; UA ;  Could not check stool for Cdiff as he took Senna.  Mild thrombocytopenia - monitor  PLAN  Check stool for C difficile if diarrhea continues.  Continue Zosyn (now off Vancomycin) - Day # 5  U/A - unremarkable   Appreciate CCM's assistance  Hospital day # [place] PA-C [facility] Pager [phone] 06/10/2017, 2:43 PM I have personally examined this patient, reviewed notes, independently viewed imaging studies, participated in medical decision making and plan of care.ROS completed by me personally and pertinent positives fully documented  I have made any additions or clarifications directly to the above note. Agree with note above.Long d/w wife about prognosis.She agrees with DNR but wants to support for few days and see if he improves.d/w wife and Dr Jason Callahan plan of care.This patient is  critically ill and at significant risk of neurological worsening, death and care requires constant monitoring of vital signs, hemodynamics,respiratory and cardiac monitoring, extensive review of multiple databases, frequent neurological assessment, discussion with family, other specialists and medical decision making of high complexity.I have made any additions or clarifications directly to the above note.This critical care time does not reflect procedure time, or teaching time or supervisory time of PA/NP/Med Resident etc but could involve care discussion time.  I spent 30 minutes of neurocritical care time  in the care of  this patient.     Antony Contras, MD Medical Director [place] Pager: [phone] 06/10/2017 3:23 PM     To contact Stroke Continuity provider, please refer to [email or web address] After hours, contact General Neurology

## 2017-06-10 NOTE — Consult Note (Signed)
Physical Medicine and Rehabilitation Consult   Reason for Consult: Stroke with functional deficits.  Referring Physician: Dr. Erlinda Hong   HPI: Jason Callahan is a 77 y.o. 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 BP at evaluation by EMS 220/120 and CT head reviewed, showing left frontal ICH.  History taken from chart review and family. He was treated with mannitol and started on hypertonic saline. Follow up MRI brain done revealing 9.6 X 5.2 X 4.7 cm left frontal lobe hematoma with 12 mm rightward midline shift--unchanged and small amount of L-SAH.  2 D echo showed EF 65-70% with moderate aortic calcification and moderate increased PA pressures.  Hospital course significant for issues with fevers, tachycardia, hypoxia with increased WOB and lethargy. PCCM consulted and CXR 5/26 showed evidence of aspiration PNA.  He was started on IV antibiotics as well as stress dose steroids with improvement. Mentation improving with ability to tolerate therapy. He continues to be limited by global aphasia, left gaze preference, right sided weakness and difficulty following commands. Cortak in place for nutritional support. CIR recommended due to functional decline.   Review of Systems  Unable to perform ROS: Mental acuity    Past Medical History:  Diagnosis Date  . 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 Diverticulitis   . GERD (gastroesophageal reflux disease)   . Gout   . Hernia, inguinal, left   . Hypertension   . LVH (left ventricular hypertrophy)   . Nephrolithiasis   . Osteopenia   . PVD (peripheral vascular disease) ([place])   . Snoring 02/10/2014    Past Surgical History:  Procedure Laterality Date  . CEA  2000  . HERNIA REPAIR Left     Family History  Problem Relation Age of Onset  . Heart disease Father     . Arthritis Mother     Social History:  Married. Wife retired and supportive. They have a daughter in town and a son out of state. Retired- worked with Radio producer. He was independent and very active--works on Investment banker, operational, teaches computers at [employer] and goes out with friends twice a week. Per reports that he has never smoked. He has never used smokeless tobacco.  Per reports  he drinks 2 glasses of wine daily. Per reports  he does not use drugs.   Allergies: No Known Allergies    Medications Prior to Admission  Medication Sig Dispense Refill  . allopurinol (ZYLOPRIM) 300 MG tablet Take 300 mg by mouth daily.   2  . cholecalciferol (VITAMIN D) 1000 UNITS tablet Take 1,000 Units by mouth daily.    . cyanocobalamin 1000 MCG tablet Take 1,000 mcg by mouth daily.    . irbesartan-hydrochlorothiazide (AVALIDE) 300-12.5 MG per tablet Take 0.5 tablets by mouth daily.   0  . Multiple Vitamin (MULTIVITAMIN) tablet Take 1 tablet by mouth daily. Centrum Silver    . rosuvastatin (CRESTOR) 20 MG tablet Take 20 mg by mouth daily.      Home: Home Living Family/patient expects to be discharged to:: Private residence Living Arrangements: Spouse/significant other Available Help at Discharge: Family Type of Home: House Home Access: Stairs to enter Technical brewer of Steps: 2 Entrance Stairs-Rails: None Home Layout: Two level, Able to live on main level with bedroom/bathroom Home Equipment: None  Lives With: Spouse  Functional  History: Prior Function Level of Independence: Independent Functional Status:  Mobility: Bed Mobility Overal bed mobility: Needs Assistance Bed Mobility: Rolling, Sidelying to Sit, Sit to Supine Rolling: Mod assist Sidelying to sit: Max assist, +2 for safety/equipment Supine to sit: +2 for physical assistance, Total assist Sit to supine: Max assist General bed mobility comments: Family present to assist with lines.  cues for sequence, truncal assist and keeping L UE  from overpowering his right side. Transfers Overall transfer level: Needs assistance Transfers: Sit to/from Stand, Lateral/Scoot Transfers Sit to Stand: Max assist, +2 safety/equipment Stand pivot transfers: Max assist, +2 safety/equipment  Lateral/Scoot Transfers: Max assist, +2 safety/equipment General transfer comment: pt required facilitation for anterior translation of trunk as well as facilitation at ischium and trunk for extension  Ambulation/Gait General Gait Details: NT    ADL: ADL Overall ADL's : Needs assistance/impaired Eating/Feeding: NPO Grooming: Wash/dry hands, Wash/dry face, Oral care, Total assistance, Sitting Upper Body Bathing: Total assistance, Bed level Lower Body Bathing: Total assistance, Bed level Upper Body Dressing : Total assistance, Bed level Lower Body Dressing: Total assistance, Bed level Toilet Transfer: Maximal assistance, +2 for safety/equipment, Stand-pivot Toileting- Clothing Manipulation and Hygiene: Total assistance, Sit to/from stand Functional mobility during ADLs: Maximal assistance, +2 for safety/equipment, +2 for physical assistance General ADL Comments: Pt lethargic this date limiting his abiity to participate   Cognition: Cognition Overall Cognitive Status: Impaired/Different from baseline Orientation Level: Other (comment)(mute) Cognition Arousal/Alertness: Lethargic, Awake/alert Behavior During Therapy: Flat affect, Restless Overall Cognitive Status: Impaired/Different from baseline Area of Impairment: Attention Current Attention Level: Sustained General Comments: more alert during the session and more aware.  Blood pressure 107/63, pulse 84, temperature 99.9 F (37.7 C), temperature source Axillary, resp. rate (!) 21, height 5\' 10"  (1.778 m), weight 81.8 kg (180 lb 5.4 oz), SpO2 95 %. Physical Exam  Nursing note and vitals reviewed. Constitutional: He appears well-developed and well-nourished. He appears lethargic. He is easily  aroused. Nasal cannula in place.  Lethargic with bouts of restlessness. Left mitten in place. Cortak in  Place.   HENT:  Head: Normocephalic and atraumatic.  Eyes: Right eye exhibits no discharge. Left eye exhibits no discharge.  Keeps eyes closed  Neck: Normal range of motion. Neck supple.  Cardiovascular: Regular rhythm.  +Tachycardia  Respiratory: Effort normal and breath sounds normal.  +[place]mena  GI: Soft. Bowel sounds are normal.  Musculoskeletal:  No edema or tenderness in extremities  Neurological: He is easily aroused. He appears lethargic.  Global aphasia.  Unable to follow simple motor commands with visual or tactile cues.  Withdraws to painful stimuli in all extremities  Skin: Skin is warm and dry.  Psychiatric:  Unable to assess due to mentation    Results for orders placed or performed during the hospital encounter of 05/23/2017 (from the past 24 hour(s))  Glucose, capillary     Status: Abnormal   Collection Time: 06/09/17 11:02 AM  Result Value Ref Range   Glucose-Capillary 141 (H) 65 - 99 mg/dL  Urinalysis, Complete w Microscopic     Status: Abnormal   Collection Time: 06/09/17  1:45 PM  Result Value Ref Range   Color, Urine YELLOW YELLOW   APPearance CLEAR CLEAR   Specific Gravity, Urine 1.018 1.005 - 1.030   pH 6.0 5.0 - 8.0   Glucose, UA NEGATIVE NEGATIVE mg/dL   Hgb urine dipstick SMALL (A) NEGATIVE   Bilirubin Urine NEGATIVE NEGATIVE   Ketones, ur NEGATIVE NEGATIVE mg/dL   Protein, ur NEGATIVE NEGATIVE  mg/dL   Nitrite NEGATIVE NEGATIVE   Leukocytes, UA NEGATIVE NEGATIVE   RBC / HPF 6-10 0 - 5 RBC/hpf   Bacteria, UA NONE SEEN NONE SEEN   Squamous Epithelial / LPF 0-5 0 - 5  Basic metabolic panel     Status: Abnormal   Collection Time: 06/09/17  1:47 PM  Result Value Ref Range   Sodium 150 (H) 135 - 145 mmol/L   Potassium 4.1 3.5 - 5.1 mmol/L   Chloride 112 (H) 101 - 111 mmol/L   CO2 28 22 - 32 mmol/L   Glucose, Bld 152 (H) 65 - 99 mg/dL   BUN 32 (H) 6  - 20 mg/dL   Creatinine, Ser 1.09 0.61 - 1.24 mg/dL   Calcium 8.8 (L) 8.9 - 10.3 mg/dL   GFR calc non Af Amer >60 >60 mL/min   GFR calc Af Amer >60 >60 mL/min   Anion gap 10 5 - 15  Glucose, capillary     Status: Abnormal   Collection Time: 06/09/17  3:56 PM  Result Value Ref Range   Glucose-Capillary 133 (H) 65 - 99 mg/dL  Glucose, capillary     Status: Abnormal   Collection Time: 06/09/17  7:23 PM  Result Value Ref Range   Glucose-Capillary 129 (H) 65 - 99 mg/dL  Basic metabolic panel     Status: Abnormal   Collection Time: 06/09/17  7:45 PM  Result Value Ref Range   Sodium 148 (H) 135 - 145 mmol/L   Potassium 3.9 3.5 - 5.1 mmol/L   Chloride 113 (H) 101 - 111 mmol/L   CO2 28 22 - 32 mmol/L   Glucose, Bld 140 (H) 65 - 99 mg/dL   BUN 33 (H) 6 - 20 mg/dL   Creatinine, Ser 1.05 0.61 - 1.24 mg/dL   Calcium 8.4 (L) 8.9 - 10.3 mg/dL   GFR calc non Af Amer >60 >60 mL/min   GFR calc Af Amer >60 >60 mL/min   Anion gap 7 5 - 15  Glucose, capillary     Status: Abnormal   Collection Time: 06/09/17 11:20 PM  Result Value Ref Range   Glucose-Capillary 101 (H) 65 - 99 mg/dL  Glucose, capillary     Status: Abnormal   Collection Time: 06/10/17  3:14 AM  Result Value Ref Range   Glucose-Capillary 145 (H) 65 - 99 mg/dL  Basic metabolic panel     Status: Abnormal   Collection Time: 06/10/17  5:16 AM  Result Value Ref Range   Sodium 149 (H) 135 - 145 mmol/L   Potassium 3.6 3.5 - 5.1 mmol/L   Chloride 114 (H) 101 - 111 mmol/L   CO2 29 22 - 32 mmol/L   Glucose, Bld 113 (H) 65 - 99 mg/dL   BUN 35 (H) 6 - 20 mg/dL   Creatinine, Ser 1.08 0.61 - 1.24 mg/dL   Calcium 8.6 (L) 8.9 - 10.3 mg/dL   GFR calc non Af Amer >60 >60 mL/min   GFR calc Af Amer >60 >60 mL/min   Anion gap 6 5 - 15  CBC with Differential/Platelet     Status: Abnormal   Collection Time: 06/10/17  5:16 AM  Result Value Ref Range   WBC 17.2 (H) 4.0 - 10.5 K/uL   RBC 4.37 4.22 - 5.81 MIL/uL   Hemoglobin 14.1 13.0 - 17.0 g/dL    HCT 43.1 39.0 - 52.0 %   MCV 98.6 78.0 - 100.0 fL   MCH 32.3 26.0 - 34.0 pg  MCHC 32.7 30.0 - 36.0 g/dL   RDW 12.7 11.5 - 15.5 %   Platelets 136 (L) 150 - 400 K/uL   Neutrophils Relative % 59 %   Neutro Abs 10.3 (H) 1.7 - 7.7 K/uL   Lymphocytes Relative 26 %   Lymphs Abs 4.4 (H) 0.7 - 4.0 K/uL   Monocytes Relative 7 %   Monocytes Absolute 1.2 (H) 0.1 - 1.0 K/uL   Eosinophils Relative 5 %   Eosinophils Absolute 0.8 (H) 0.0 - 0.7 K/uL   Basophils Relative 1 %   Basophils Absolute 0.1 0.0 - 0.1 K/uL   Immature Granulocytes 2 %   Abs Immature Granulocytes 0.4 (H) 0.0 - 0.1 K/uL  Glucose, capillary     Status: Abnormal   Collection Time: 06/10/17  8:13 AM  Result Value Ref Range   Glucose-Capillary 145 (H) 65 - 99 mg/dL   Comment 1 Notify RN    Comment 2 Document in Chart    Ct Head Wo Contrast  Result Date: 06/10/2017 CLINICAL DATA:  Follow-up of intracranial hemorrhage EXAM: CT HEAD WITHOUT CONTRAST TECHNIQUE: Contiguous axial images were obtained from the base of the skull through the vertex without intravenous contrast. COMPARISON:  06/06/2017 FINDINGS: Brain: Intraparenchymal hematoma centered in the left frontal lobe is unchanged in size, with slight shift in configuration. Decreased amount of blood in the right lateral ventricle. Small amount of extra-axial blood over the left convexity is unchanged. At the level of the foramina Monro, there is rightward midline shift 5 mm unchanged. There is no new site of hemorrhage. Vascular: No abnormal hyperdensity of the major intracranial arteries or dural venous sinuses. No intracranial atherosclerosis. Skull: The visualized skull base, calvarium and extracranial soft tissues are normal. Sinuses/Orbits: No fluid levels or advanced mucosal thickening of the visualized paranasal sinuses. No mastoid or middle ear effusion. The orbits are normal. IMPRESSION: Expected evolution of intraparenchymal hematoma centered in the left frontal lobe,  without increase in size or new site of hemorrhage. Unchanged 5 mm rightward midline shift with small volume extra-axial blood over the left convexity. Intraventricular blood is decreased. Electronically Signed   By: Ulyses Jarred M.D.   On: 06/10/2017 04:30   Dg Chest Port 1 View  Result Date: 06/09/2017 CLINICAL DATA:  Leukocytosis. EXAM: PORTABLE CHEST 1 VIEW COMPARISON:  06/07/2017. FINDINGS: Right subclavian catheter tip is in the projection of the cavoatrial junction. Feeding tube tip is below the GE junction. Right lower lobe volume loss and asymmetric elevation of the right hemidiaphragm has increased from previous exam. IMPRESSION: 1. Decreased aeration to the right base with increased asymmetric elevation of the right hemidiaphragm. Electronically Signed   By: Kerby Moors M.D.   On: 06/09/2017 08:46    Assessment/Plan: Diagnosis: left frontal lobe hematoma Labs and images independently reviewed.  Records reviewed and summated above. Stroke: Continue secondary stroke prophylaxis and Risk Factor Modification listed below:   Blood Pressure Management:  Continue current medication with prn's with permisive HTN per primary team ?hemiparesis:   1. Does the need for close, 24 hr/day medical supervision in concert with the patient's rehab needs make it unreasonable for this patient to be served in a less intensive setting? Yes  2. 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 Due to bladder management, safety,  skin/wound care, disease management, medication administration and patient education, does the patient require 24 hr/day rehab nursing? Yes 4. Does the patient require coordinated care of a physician, rehab nurse, PT (1-2 hrs/day, 5 days/week), OT (1-2 hrs/day, 5 days/week) and SLP (1-2 hrs/day, 5 days/week) to address physical and functional deficits in the context of the above medical diagnosis(es)? Yes Addressing deficits in the following areas: balance, endurance, locomotion, strength, transferring, bowel/bladder control, bathing, dressing, feeding, grooming, toileting, cognition, speech, language, swallowing and psychosocial support 5. Can the patient actively participate in an intensive therapy program of at least 3 hrs of therapy per day at least 5 days per week? Potentially 6. The potential for patient to make measurable gains while on inpatient rehab is excellent 7. Anticipated functional outcomes upon discharge from inpatient rehab are min assist  with PT, min assist with OT, min assist with SLP. 8. Estimated rehab length of stay to reach the above functional goals is: 25-30 days. 9. Anticipated D/C setting: Home 10. Anticipated post D/C treatments: HH therapy and Home excercise program 11. Overall Rehab/Functional Prognosis: good  RECOMMENDATIONS: This patient's condition is appropriate for continued rehabilitative care in the following setting: CIR once medically stable Patient has agreed to participate in recommended program. Potentially Note that insurance prior authorization may be required for reimbursement for recommended care.  Comment: Rehab Admissions Coordinator to follow up.   I have personally performed a face to face diagnostic evaluation, including, but not limited to relevant history and physical exam findings, of this patient and developed relevant assessment and plan.  Additionally, I have reviewed and concur with the physician assistant's documentation above.   Delice Lesch, MD, ABPMR Bary Leriche, PA-C 06/10/2017

## 2017-06-11 LAB — BASIC METABOLIC PANEL
ANION GAP: 8 (ref 5–15)
BUN: 36 mg/dL — ABNORMAL HIGH (ref 6–20)
CALCIUM: 8.8 mg/dL — AB (ref 8.9–10.3)
CO2: 25 mmol/L (ref 22–32)
CREATININE: 1.04 mg/dL (ref 0.61–1.24)
Chloride: 115 mmol/L — ABNORMAL HIGH (ref 101–111)
GFR calc Af Amer: >60 mL/min (ref >60)
GFR calc non Af Amer: >60 mL/min (ref >60)
Glucose, Bld: 148 mg/dL — ABNORMAL HIGH (ref 65–99)
Potassium: 4.3 mmol/L (ref 3.5–5.1)
Sodium: 148 mmol/L — ABNORMAL HIGH (ref 135–145)

## 2017-06-11 LAB — CBC WITH DIFFERENTIAL/PLATELET
ABS IMMATURE GRANULOCYTES: 0.4 10*3/uL — AB (ref 0.0–0.1)
BASOS ABS: 0.1 10*3/uL (ref 0.0–0.1)
BASOS PCT: 1 %
EOS ABS: 0.9 10*3/uL — AB (ref 0.0–0.7)
Eosinophils Relative: 5 %
HCT: 43.2 % (ref 39.0–52.0)
Hemoglobin: 14.3 g/dL (ref 13.0–17.0)
IMMATURE GRANULOCYTES: 2 %
Lymphocytes Relative: 27 %
Lymphs Abs: 5.1 10*3/uL — ABNORMAL HIGH (ref 0.7–4.0)
MCH: 32.5 pg (ref 26.0–34.0)
MCHC: 33.1 g/dL (ref 30.0–36.0)
MCV: 98.2 fL (ref 78.0–100.0)
Monocytes Absolute: 1 10*3/uL (ref 0.1–1.0)
Monocytes Relative: 5 %
NEUTROS ABS: 11.3 10*3/uL — AB (ref 1.7–7.7)
Neutrophils Relative %: 60 %
PLATELETS: 167 10*3/uL (ref 150–400)
RBC: 4.4 MIL/uL (ref 4.22–5.81)
RDW: 12.5 % (ref 11.5–15.5)
WBC: 18.8 10*3/uL — ABNORMAL HIGH (ref 4.0–10.5)

## 2017-06-11 LAB — GLUCOSE, CAPILLARY
GLUCOSE-CAPILLARY: 124 mg/dL — AB (ref 65–99)
GLUCOSE-CAPILLARY: 149 mg/dL — AB (ref 65–99)
Glucose-Capillary: 133 mg/dL — ABNORMAL HIGH (ref 65–99)
Glucose-Capillary: 143 mg/dL — ABNORMAL HIGH (ref 65–99)
Glucose-Capillary: 129 mg/dL — ABNORMAL HIGH (ref 65–99)
Glucose-Capillary: 129 mg/dL — ABNORMAL HIGH (ref 65–99)

## 2017-06-11 MED ORDER — ENOXAPARIN SODIUM 40 MG/0.4ML ~~LOC~~ SOLN
40.0000 mg | SUBCUTANEOUS | Status: DC
  Administered 2017-06-11 – 2017-06-13 (×3): 40 mg via SUBCUTANEOUS
  Filled 2017-06-11 (×3): qty 0.4

## 2017-06-11 NOTE — Progress Notes (Addendum)
STROKE TEAM PROGRESS NOTE   SUBJECTIVE (INTERVAL HISTORY) He patient's wife at bedside. Pt awake. Wife reports no interaction. Will mimic on the L. No speech. No longer meeting ICU criteria.  OBJECTIVE Temp:  [98.2 F (36.8 C)-98.9 F (37.2 C)] 98.6 F (37 C) (05/28 0400) Pulse Rate:  [74-107] 84 (05/28 0530) Cardiac Rhythm: Normal sinus rhythm (05/27 2000) Resp:  [16-27] 19 (05/28 0530) BP: (107-206)/(63-120) 123/77 (05/28 0530) SpO2:  [72 %-100 %] 100 % (05/28 0530) Weight:  [82.1 kg (181 lb)] 82.1 kg (181 lb) (05/28 0500)  Recent Labs  Lab 06/10/17 1151 06/10/17 1601 06/10/17 1939 06/10/17 2322 06/11/17 0332  GLUCAP 127* 133* 148* 111* 133*   Recent Labs  Lab 06/05/17 1235 06/05/17 1700  06/06/17 0534  06/06/17 1512  06/09/17 0414 06/09/17 1347 06/09/17 1945 06/10/17 0516 06/11/17 0713  NA 158* 157*   < > 159*  160*   < > 160*   < > 149* 150* 148* 149* 148*  K  --   --   --  3.2*  --  3.9   < > 2.6* 4.1 3.9 3.6 4.3  CL  --   --   --  123*  --  124*   < > 112* 112* 113* 114* 115*  CO2  --   --   --  28  --  28   < > 30 28 28 29 25   GLUCOSE  --   --   --  164*  --  159*   < > 168* 152* 140* 113* 148*  BUN  --   --   --  35*  --  45*   < > 31* 32* 33* 35* 36*  CREATININE  --   --   --  1.27*  --  1.40*   < > 1.02 1.09 1.05 1.08 1.04  CALCIUM  --   --   --  9.2  --  9.0   < > 8.6* 8.8* 8.4* 8.6* 8.8*  MG 2.3 2.3  --  2.2  --  2.3  --   --   --   --   --   --   PHOS 2.6 1.7*  --  2.8  --  3.2  --   --   --   --   --   --    < > = values in this interval not displayed.   Recent Labs  Lab 06/06/17 1512  AST 31  ALT 20  ALKPHOS 56  BILITOT 1.5*  PROT 6.1*  ALBUMIN 3.5   Recent Labs  Lab 06/06/17 0534 06/07/17 0415 06/09/17 0414 06/10/17 0516 06/11/17 0713  WBC 18.8* 15.7* 16.8* 17.2* 18.8*  NEUTROABS 11.9* 11.0* 10.5* 10.3* 11.3*  HGB 15.6 14.0 15.3 14.1 14.3  HCT 48.3 43.6 46.1 43.1 43.2  MCV 101.7* 100.2* 98.3 98.6 98.2  PLT 163 143* 139* 136* 167    No results for input(s): CKTOTAL, CKMB, CKMBINDEX, TROPONINI in the last 168 hours. No results for input(s): LABPROT, INR in the last 72 hours. Recent Labs    06/09/17 1345  COLORURINE YELLOW  LABSPEC 1.018  PHURINE 6.0  GLUCOSEU NEGATIVE  HGBUR SMALL*  BILIRUBINUR NEGATIVE  KETONESUR NEGATIVE  PROTEINUR NEGATIVE  NITRITE NEGATIVE  LEUKOCYTESUR NEGATIVE       Component Value Date/Time   CHOL 111 06/03/2017 0722   TRIG 321 (H) 06/07/2017 0737   HDL 46 06/03/2017 0722   CHOLHDL 2.4 06/03/2017 0722   VLDL  23 06/03/2017 0722   LDLCALC 42 06/03/2017 0722   Lab Results  Component Value Date   HGBA1C 5.3 05/18/2017   No results found for: LABOPIA, COCAINSCRNUR, LABBENZ, AMPHETMU, THCU, LABBARB  No results for input(s): ETH in the last 168 hours.  IMAGING Ct Soft Tissue Neck W Contrast 05/28/2017 Negative for mass or adenopathy in the neck  Occluded left internal carotid artery.  Right carotid endarterectomy is patent.   Ct Chest W Contrast 05/29/2017 1. No evidence of lymphadenopathy, splenomegaly or metastatic disease.  2. No acute abnormalities.  3. Nonobstructing RIGHT renal calculi  4. Moderate to marked prostate enlargement  5. Coronary artery and Aortic Atherosclerosis (ICD10-I70.0).   Ct Head Code Stroke Wo Contrast 05/22/2017 1. Large hemorrhage in the left frontal lobe, 61 mL. Differential diagnostic considerations include amyloid, hypertension, vascular malformation. Hemorrhagic stroke not considered likely with acute onset of symptoms.  2. ASPECTS is 10 3.   Ct Angio Head W Or Wo Contrast Ct Angio Neck W Or Wo Contrast 05/27/2017 1. Large left frontal hematoma. No evidence of underlying vascular malformation or tumor  2. Right carotid endarterectomy widely patent  3. 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 Left cavernous carotid is patent but severely stenotic.   Mr Kizzie Fantasia  Contrast 06/03/2017 1. Large left frontal lobe hematoma, unchanged from today's earlier CT. Unchanged mass effect with 12 mm of midline shift. No evidence of underlying mass. No chronic hemorrhages to suggest cerebral amyloid angiopathy.  2. Small volume adjacent subarachnoid hemorrhage and trace intraventricular hemorrhage. No evidence of hydrocephalus.   Ct Head Wo Contrast 06/03/2017 1. Expanding left frontal hematoma with increased mass effect as described.  2. Increasing mass effect on the left lateral ventricle without intraventricular hemorrhage or downward herniation.  06/04/2017 Large intraparenchymal hemorrhage in the left frontal lobe slightly smaller, likely due to decompression the ventricles. Small amount of ventricular hemorrhage noted.  Subarachnoid hemorrhage on the left is unchanged 6.5 mm midline shift towards the right is unchanged.  06/06/2017 1. Evolving LEFT frontal lobe hematoma with similar intraventricular extension.  2. 7 mm LEFT-to-RIGHT midline shift. No ventricular entrapment or hydrocephalus.  3. Similar small volume subarachnoid hemorrhage.  06/10/2017 Expected evolution of intraparenchymal hematoma centered in the left frontal lobe, without increase in size or new site of hemorrhage. Unchanged 5 mm rightward midline shift with small volume extra-axial blood over the left convexity. Intraventricular blood is decreased.  Dg Chest Port 1 View 06/03/2017 The new right central line is in good position.  No pneumothorax.  06/04/2017 Mild pulmonary vascular congestion without overt edema, unchanged.  06/05/2017 1. Low lung volumes with suspected tiny right effusion. Increasing atelectasis at both lung bases  2. Stable vascular congestion  3. Esophageal tube tip is in the left upper quadrant  06/07/2017 No active disease. 06/09/2017 1. Decreased aeration to the right base with increased asymmetric elevation of the right hemidiaphragm.  TTE - Left ventricle: The  cavity size was normal. Wall thickness was increased in a pattern of mild LVH. Systolic function was vigorous. The estimated ejection fraction was in the range of 65% to 70%. Wall motion was normal; there were no regional wall motion abnormalities. - Aortic valve: Trileaflet; mildly thickened, moderately calcified leaflets. Moderate calcification involving the noncoronary cusp. - Pulmonary arteries: Systolic pressure was moderately increased. PA peak pressure: 51 mm Hg (S).   PHYSICAL EXAM General - Well nourished, well developed, alert.   Cardiovascular -  Regular rate and rhythm.  Neuro - awake.  Blinks on the left not on the right.  Global aphasia without speech, not following commands. Will mimic gestures on the L. Left pupil round 2.7mm, right surgical pupil dilated with irregular sharp, left gaze preference, not cross midline. Blinking to visual threat on the left but not on the right. Right facial droop and tongue midline in mouth. Moves L arm and leg purposefully, able to hold against gravity. RUE flaccid without withdrawal and RLE 1/5 withdraw on pain stimulation. DTR 1+ and right babinski positive. Coordination and gait not tested.   ASSESSMENT/PLAN Mr. DAMAREA MERKEL is a 77 y.o. 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 No tPA given due to [place].    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 Change to lovenox  aspirin 81 mg daily prior to admission, now on No antithrombotic d/t hmg  Ongoing aggressive stroke risk factor management  Therapy recommendations:  CIR  Disposition:  Pending  Transfer to floor today  Cerebral edema  CT showed left frontal ICH with mass effect  Treated with 3%, now off and on NS  Na 148   CT repeat 06/10/17 stable hematoma and midline shift  Fever, reolsved  T MAX 101.4 -> 98.9  BCx NGTD  CXR s x 3 unremarkable  UA repeat neg  On vanco (now off)   On zosyn (started 06/06/17) (Day #5) complete 7-d course  Carotid stenosis  Chronic left ICA occlusion  Right ICA stenosis s/p CEA 2000  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 If now, wife will have to decide if she wants PEG for him in order to go to SNF  Other Stroke Risk Factors  Advanced age  PVD  Obstructive sleep apnea, on CPAP at home  Other Active Problems  Hyperglycemia  Hypokalemia resolved, 4.3  Diarrhea per nursing - secondary to TF -> rectal tube and imodium prn/ Check stool for C difficile if diarrhea continues.  Mild thrombocytopenia - 167  Mild AKI, resolved  Hospital day # [place], MSN, APRN, ANVP-BC, AGPCNP-BC Advanced Practice Stroke Nurse [place] for Schedule & Pager information 06/11/2017 11:21 AM  I have personally examined this patient, reviewed notes,  independently viewed imaging studies, participated in medical decision making and plan of care.ROS completed by me personally and pertinent positives fully documented  I have made any additions or clarifications directly to the above note. Agree with note above.  The patient seems to make him only minimal improvement and will likely need prolonged feeding tube and rehabilitation and nursing home situation. And long discussion with the patient's wife at the bedside and explained his prognosis and answered questions. She is still hopeful and would like to support him for a few more days before she makes a final decision about PEG tube and nursing home. Discuss with Dr. Marolyn Hammock of pulmonary critical care medicine. Transfer to neurology floor bed is available.This patient is critically ill and at significant risk of neurological worsening, death and care requires constant monitoring of vital signs, hemodynamics,respiratory and cardiac monitoring, extensive review of multiple databases, frequent neurological assessment, discussion with family, other specialists and medical decision making of high complexity.I have made any additions or clarifications directly to the above note.This critical care time does not reflect procedure time, or teaching time or supervisory time of PA/NP/Med Resident etc but could involve care discussion time.  I spent 30 minutes of neurocritical care time  in the care of  this patient.      Antony Contras, MD Medical Director [facility] Stroke Center Pager: [phone] 06/11/2017 1:16 PM   To contact Stroke Continuity provider, please refer to [email or web address] After hours, contact General Neurology

## 2017-06-11 NOTE — Progress Notes (Signed)
Physical Therapy Treatment Patient Details Name: Jason Callahan MRN: [id number] DOB: 01/17/1940 Today's Date: 06/11/2017    History of Present Illness This 77 y.o. male admitted with acute onset Rt sided weakness, Rt facial droop, and aphasia.   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  PMH includes:  PVD, LVH, Gout. s/p CEA.      PT Comments    Pt very fatigue after sitting in the chair today.  Emphasis on transfers and sitting balance .  However, a lot of time spent positioning and getting patient dry from catheter coming off.  Follow Up Recommendations  CIR;Supervision/Assistance - 24 hour     Equipment Recommendations  (TBD)    Recommendations for Other Services Rehab consult     Precautions / Restrictions Precautions Precautions: Fall    Mobility  Bed Mobility Overal bed mobility: Needs Assistance Bed Mobility: Sit to Sidelying         Sit to sidelying: Max assist;+2 for physical assistance General bed mobility comments: needed truncal stability and full assist of legs into the bed  Transfers Overall transfer level: Needs assistance Equipment used: None Transfers: Sit to/from [employer] Sit to Stand: Max assist;+2 safety/equipment   Squat pivot transfers: Max assist;+2 safety/equipment     General transfer comment: pt needed facilitation to bring in right side to help left boost up.  For transfer, pt needed w/shift help along with pivot assist on the right LE.  Ambulation/Gait             General Gait Details: NT   Stairs             Wheelchair Mobility    Modified Rankin (Stroke Patients Only) Modified Rankin (Stroke Patients Only) Modified Rankin: Severe disability     Balance Overall balance assessment: Needs assistance Sitting-balance support: Feet supported Sitting balance-Leahy Scale: Poor Sitting balance - Comments: pt worked EOB for ~5 min on upright sitting/truncal  activation.  Due to fatigue, pt's tenuous balance degraded quickly with little attempts to stop any movement from midline.     Standing balance-Leahy Scale: Poor                              Cognition Arousal/Alertness: Awake/alert Behavior During Therapy: Flat affect Overall Cognitive Status: Impaired/Different from baseline Area of Impairment: Following commands                   Current Attention Level: Focused   Following Commands: Follows one step commands with increased time              Exercises      General Comments        Pertinent Vitals/Pain Pain Assessment: Faces Faces Pain Scale: No hurt    Home Living                      Prior Function            PT Goals (current goals can now be found in the care plan section) Acute Rehab PT Goals PT Goal Formulation: With patient Time For Goal Achievement: 06/18/17 Potential to Achieve Goals: Good Progress towards PT goals: Progressing toward goals    Frequency    Min 4X/week      PT Plan Current plan remains appropriate    Co-evaluation  AM-PAC PT "6 Clicks" Daily Activity  Outcome Measure  Difficulty turning over in bed (including adjusting bedclothes, sheets and blankets)?: Unable Difficulty moving from lying on back to sitting on the side of the bed? : Unable Difficulty sitting down on and standing up from a chair with arms (e.g., wheelchair, bedside commode, etc,.)?: Unable Help needed moving to and from a bed to chair (including a wheelchair)?: Total Help needed walking in hospital room?: Total Help needed climbing 3-5 steps with a railing? 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

## 2017-06-11 NOTE — Progress Notes (Signed)
PULMONARY / CRITICAL CARE MEDICINE   Name: Jason Callahan MRN: [id number] DOB: 12/15/1940    ADMISSION DATE:  05/27/2017 CONSULTATION DATE:  06/06/2017   REFERRING MD:  Erlinda Hong  CHIEF COMPLAINT:  Fever and increasing respiratory distress following ICH  HISTORY OF PRESENT ILLNESS:   77 year old male with past medical history significant for OSA, GERD, PVA, HTN, and left ICA s/p CEA admitted on 5/19 with stroke.  Presented with acute hypertension, right sided weakness, facial droop and expressive aphasia. Found to have large right frontal hemorrhage with resultant global aphasia and RUE plegia. NSGY was consulted without recommendations for surgical intervention. He was placed on hypertonic saline  He has had no significant subjective change overnight.  He orients his eyes towards me when I speak this morning but does not interact.  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  PAST SURGICAL HISTORY: He  has a past surgical history that includes Hernia repair (Left) and CEA (2000).  No Known Allergies  No current facility-administered medications on file prior to encounter.    Current Outpatient Medications on File Prior to Encounter  Medication Sig  . allopurinol (ZYLOPRIM) 300 MG tablet Take 300 mg by mouth daily.   . cholecalciferol (VITAMIN D) 1000 UNITS tablet Take 1,000 Units by mouth daily.  . cyanocobalamin 1000 MCG tablet Take 1,000 mcg by mouth daily.  . irbesartan-hydrochlorothiazide (AVALIDE) 300-12.5 MG per tablet Take 0.5 tablets by mouth daily.   . Multiple Vitamin (MULTIVITAMIN) tablet Take 1 tablet by mouth daily. Centrum Silver  .  rosuvastatin (CRESTOR) 20 MG tablet Take 20 mg by mouth daily.    FAMILY HISTORY:  His indicated that his mother is deceased. He indicated that his father is deceased. He indicated that his sister is alive. He indicated that his brother is alive.   SOCIAL HISTORY: He  reports that he has never smoked. He has never used smokeless tobacco. He reports that he drinks about 4.2 oz of alcohol per week. He reports that he does not use drugs.  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  SUBJECTIVE:  According to family he is more confused and agitated today with rising fever.  VITAL SIGNS: BP 123/77   Pulse 84   Temp 98.6 F (37 C) (Axillary)   Resp 19   Ht 5\' 10"  (1.778 m)   Wt 181 lb (82.1 kg)   SpO2 100%   BMI 25.97 kg/m   HEMODYNAMICS:  Hypertensive on no infusions  VENTILATOR SETTINGS:  Currently not intubated.  DO NOT INTUBATE as per family/patient's request.  INTAKE / OUTPUT: I/O last 3 completed shifts: In: 3739.7 [I.V.:514.7; NG/GT:3175; IV Piggyback:50] Out: 3267 [Urine:3000; TIWPY:0998]  PHYSICAL EXAMINATION: General: He is lying comfortably in bed in no overt distress  Neuro: Spontaneous eye opening, but no interaction and no response to voice.  Spontaneously moving the left extremities but not the right.  Right pupil is postsurgical HEENT: Bridled cortrak tube in place.  No nasal damage Cardiovascular: S1 and S2 are regular without murmur rub or gallop  Lungs: Respirations are unlabored, there is symmetric air movement, and no wheezes.    Abdomen: The abdomen is soft without any organomegaly masses or  tenderness  Musculoskeletal: No further left great toe tenderness  Skin: Skin fragility with ecchymoses.  LABS:  BMET Recent Labs  Lab 06/09/17 1347 06/09/17 1945 06/10/17 0516  NA 150* 148* 149*  K 4.1 3.9 3.6  CL 112* 113* 114*  CO2 28 28 29   BUN 32* 33* 35*  CREATININE 1.09 1.05 1.08  GLUCOSE 152* 140* 113*     Electrolytes Recent Labs  Lab 06/05/17 1700 06/06/17 0534 06/06/17 1512  06/09/17 1347 06/09/17 1945 06/10/17 0516  CALCIUM  --  9.2 9.0   < > 8.8* 8.4* 8.6*  MG 2.3 2.2 2.3  --   --   --   --   PHOS 1.7* 2.8 3.2  --   --   --   --    < > = values in this interval not displayed.    CBC Recent Labs  Lab 06/07/17 0415 06/09/17 0414 06/10/17 0516  WBC 15.7* 16.8* 17.2*  HGB 14.0 15.3 14.1  HCT 43.6 46.1 43.1  PLT 143* 139* 136*    Coag's No results for input(s): APTT, INR in the last 168 hours.  Sepsis Markers Recent Labs  Lab 06/06/17 0534 06/06/17 1451 06/06/17 1830  LATICACIDVEN  --  1.3 1.5  PROCALCITON <0.10  --   --     ABG Recent Labs  Lab 06/05/17 2305  PHART 7.423  PCO2ART 45.4  PO2ART 69.2*    Liver Enzymes Recent Labs  Lab 06/06/17 1512  AST 31  ALT 20  ALKPHOS 56  BILITOT 1.5*  ALBUMIN 3.5    Cardiac Enzymes No results for input(s): TROPONINI, PROBNP in the last 168 hours.  Glucose Recent Labs  Lab 06/10/17 0813 06/10/17 1151 06/10/17 1601 06/10/17 1939 06/10/17 2322 06/11/17 0332  GLUCAP 145* 127* 133* 148* 111* 133*    Imaging No results found.   STUDIES:  N/A  CULTURES: Results for orders placed or performed during the hospital encounter of 05/19/2017  MRSA PCR Screening     Status: None   Collection Time: 05/26/2017  7:48 PM  Result Value Ref Range Status   MRSA by PCR NEGATIVE NEGATIVE Final    Comment:        The GeneXpert MRSA Assay (FDA approved for NASAL specimens only), is one component of a comprehensive MRSA colonization surveillance program. It is not intended to diagnose MRSA infection nor to guide or monitor treatment for MRSA infections. Performed at [facility], [place] [address], [place], [place] [address]   Culture, blood (Routine X 2) w Reflex to ID Panel     Status: None   Collection Time: 06/04/17 11:21 AM  Result Value Ref Range Status   Specimen Description BLOOD RIGHT HAND   Final   Special Requests   Final    BOTTLES DRAWN AEROBIC AND ANAEROBIC Blood Culture adequate volume   Culture   Final    NO GROWTH 5 DAYS Performed at [facility], [place] [address] [place], [place] [address]    Report Status 06/09/2017 FINAL  Final  Culture, blood (Routine X 2) w Reflex to ID Panel     Status: None   Collection Time: 06/04/17 11:26 AM  Result Value Ref Range Status   Specimen Description BLOOD LEFT HAND  Final   Special Requests   Final    BOTTLES DRAWN AEROBIC AND ANAEROBIC Blood Culture results may not be optimal due to an inadequate volume of blood received in culture bottles   Culture  Final    NO GROWTH 5 DAYS Performed at [facility], [place] [address] [place], [place] [address]    Report Status 06/09/2017 FINAL  Final     ANTIBIOTICS: Anti-infectives (From admission, onward)   Start     Dose/Rate Route Frequency Ordered Stop   06/07/17 0330  vancomycin (VANCOCIN) IVPB 750 mg/150 ml premix  Status:  Discontinued     750 mg 150 mL/hr over 60 Minutes Intravenous Every 12 hours 06/06/17 1536 06/09/17 1524   06/06/17 2330  piperacillin-tazobactam (ZOSYN) IVPB 3.375 g     3.375 g 12.5 mL/hr over 240 Minutes Intravenous Every 8 hours 06/06/17 1536 06/12/17 2359   06/06/17 1530  piperacillin-tazobactam (ZOSYN) IVPB 3.375 g     3.375 g 100 mL/hr over 30 Minutes Intravenous  Once 06/06/17 1455 06/06/17 1553   06/06/17 1530  vancomycin (VANCOCIN) 1,500 mg in sodium chloride 0.9 % 500 mL IVPB     1,500 mg 250 mL/hr over 120 Minutes Intravenous  Once 06/06/17 1455 06/06/17 1723      SIGNIFICANT EVENTS: Increasingly confused and lethargic today.  LINES/TUBES: Drain   External Urinary Catheter 5 days  Rectal Tube/Pouch less than 1 day     CVC Line   CVC Triple Lumen 06/03/17 Right Subclavian 5 days     PIV Line   Peripheral IV 06/07/2017 Left Antecubital 5 days     NG/OG Tube   Nasoenteric Feeding Tube Cortrak - 43 inches Left nare  Documented cm marking at nare/ corner of mouth 90 cm 3 days      DISCUSSION: 77 year old man with poor neurological recovery after significant left hemispheric intraparenchymal hemorrhage.  Long-term prognosis at this age with the size of the of the bleed is relatively poor (ICH score 3 - 72% mortality).  Recovery beyond his current level of function is doubtful and will take time. The patient has stipulated that he would not want intubation or CPR.  ASSESSMENT / PLAN:  PULMONARY A: Recurrent aspiration.  Likely cause of the fever as cultures are negative. P:   Continue n.p.o. status.  Maintain head of bed elevated.  Patient would not want a tracheostomy.  His CBC from this morning is still pending, if white count is normal I would advocate discontinuing his antibiotics.   CARDIOVASCULAR A:  Blood pressure is now controlled entirely with enteral agents.    RENAL A:   Mild AKI has now resolved with additional hydration.  High insensitive losses due to fever likely. Volume Status: Euvolemic P:   Continue current fluid strategy   GASTROINTESTINAL A:   Nutritional Status: At high nutritional risk P:   Continue nasogastric feeding. Fiber for diarrhea.  HEMATOLOGIC A:   At high DVT risk DVT prophylaxis: Initiate chemical prophylaxis at day 7 post bleed; Transfusion requirements: None P:  As above  INFECTIOUS A:   Persistent fever and leukocytosis despite negative cultures.  Likely chronic sterile aspiration. P:   Complete 7-day course of Zosyn.  However fever is not improved with antibiotics suggesting a noninfectious cause.   ENDOCRINE A:   No prior history of diabetes.  Stress hyperglycemia. Glycemic control: Acceptable; Stress steroids: None P:   I am anticipating that his insulin requirement will decrease as his stressors resolve, will need to ensure that we do not induce hypoglycemia with his current insulin regimen.  NEUROLOGIC A:   This is day 8 status post  large intraparenchymal hemorrhage with persistent global aphasia.  Dr. Leonie Man has discussed prognosis  with the patient's family.  At present he is not requiring aggressive life support interventions, the critical care service will not routinely follow, please reconsult [place] if needed.  Lars Masson, MD [place] Pager: [phone] After hours [phone]  06/11/2017, 7:11 AM

## 2017-06-11 NOTE — Progress Notes (Signed)
  Speech Language Pathology Treatment: Cognitive-Linquistic  Patient Details Name: Jason Callahan MRN: [id number] DOB: [birth year] Today's Date: 06/11/2017 Time: 4196-2229 SLP Time Calculation (min) (ACUTE ONLY): 10 min  Assessment / Plan / Recommendation Clinical Impression  Pt continues to be poorly arousable for assessment. SLP and RN repositioned pt, SLP utilized max tactile stimulation, hand over hand assist with oral care, verbal cues and painful stimuli to LUE with no result to keep pt awake for more than a few seconds. Pt never focused attention to any task and no POs were trialed. Discussed result with RN. Will continue efforts this week.   HPI HPI: 77 year old gentleman who was admitted with sudden onset of aphasia and right hemiplegia. CT scan showed a left frontal hemorrhage.       SLP Plan  Continue with current plan of care       Recommendations                   Oral Care Recommendations: Oral care QID Follow up Recommendations: Skilled Nursing facility SLP Visit Diagnosis: Dysphagia, unspecified (R13.10) Plan: Continue with current plan of care       GO               [facility], [place] CCC-SLP [phone]  Jason Callahan 06/11/2017, 10:55 AM

## 2017-06-11 NOTE — Progress Notes (Signed)
Inpatient Rehabilitation Admissions Coordinator  I met with patient's wife at bedside. Briefly began discussions concerning his eventual need for some type of rehab. I will follow his progress.  Danne Baxter, RN, MSN Rehab Admissions Coordinator [phone] 06/11/2017 12:53 PM

## 2017-06-11 NOTE — Progress Notes (Signed)
   06/11/17 1000  Clinical Encounter Type  Visited With Family  Visit Type Follow-up  Referral From Chaplain  Consult/Referral To Chaplain  Spiritual Encounters  Spiritual Needs Emotional  Stress Factors  Family Stress Factors Exhausted;Health changes  Chaplain was able to speak with family during rounding.  Chaplain ensured family that spiritual care services are available if needed throughout their stay.  PT family did not express need at this time.

## 2017-06-12 LAB — CBC WITH DIFFERENTIAL/PLATELET
Abs Immature Granulocytes: 0.2 10*3/uL — ABNORMAL HIGH (ref 0.0–0.1)
Basophils Absolute: 0.1 10*3/uL (ref 0.0–0.1)
Basophils Relative: 0 %
EOS ABS: 1 10*3/uL — AB (ref 0.0–0.7)
Eosinophils Relative: 6 %
HEMATOCRIT: 42.1 % (ref 39.0–52.0)
HEMOGLOBIN: 13.7 g/dL (ref 13.0–17.0)
Immature Granulocytes: 1 %
LYMPHS ABS: 5.6 10*3/uL — AB (ref 0.7–4.0)
LYMPHS PCT: 33 %
MCH: 31.5 pg (ref 26.0–34.0)
MCHC: 32.5 g/dL (ref 30.0–36.0)
MCV: 96.8 fL (ref 78.0–100.0)
MONOS PCT: 6 %
Monocytes Absolute: 1 10*3/uL (ref 0.1–1.0)
NEUTROS PCT: 54 %
Neutro Abs: 9.4 10*3/uL — ABNORMAL HIGH (ref 1.7–7.7)
Platelets: 191 10*3/uL (ref 150–400)
RBC: 4.35 MIL/uL (ref 4.22–5.81)
RDW: 12.2 % (ref 11.5–15.5)
WBC: 17.3 10*3/uL — AB (ref 4.0–10.5)

## 2017-06-12 LAB — GLUCOSE, CAPILLARY
GLUCOSE-CAPILLARY: 107 mg/dL — AB (ref 65–99)
GLUCOSE-CAPILLARY: 109 mg/dL — AB (ref 65–99)
GLUCOSE-CAPILLARY: 147 mg/dL — AB (ref 65–99)
Glucose-Capillary: 97 mg/dL (ref 65–99)
Glucose-Capillary: 108 mg/dL — ABNORMAL HIGH (ref 65–99)
Glucose-Capillary: 140 mg/dL — ABNORMAL HIGH (ref 65–99)

## 2017-06-12 NOTE — Progress Notes (Signed)
Occupational Therapy Treatment Patient Details Name: Jason Callahan MRN: [id number] DOB: 1940/03/27 Today's Date: 06/12/2017    History of present illness This 77 y.o. male admitted with acute onset Rt sided weakness, Rt facial droop, and aphasia.   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  PMH includes:  PVD, LVH, Gout. s/p CEA.     OT comments  Pt progressed to chair level with total +2 (A) this session with delayed responses to verbal commands. Pt with decr attention to the R and noted to have R eye pupil dilated with no blink to threat. Pt responding to threat on the L eye.   Follow Up Recommendations  CIR;Supervision/Assistance - 24 hour    Equipment Recommendations  None recommended by OT    Recommendations for Other Services Rehab consult    Precautions / Restrictions Precautions Precautions: Fall Restrictions Weight Bearing Restrictions: No       Mobility Bed Mobility Overal bed mobility: Needs Assistance Bed Mobility: Supine to Sit     Supine to sit: Max assist;+2 for physical assistance     General bed mobility comments: pt moved L LE  required maxA for trunk elevation and to scoot to EOB  Transfers Overall transfer level: Needs assistance Equipment used: None Transfers: Sit to/from [employer] Sit to Stand: Max assist;+2 safety/equipment Stand pivot transfers: Max assist;+2 physical assistance;+2 safety/equipment       General transfer comment: pt with no active R LE movment requiring R knee blocked, maxA to advance LEs    Balance Overall balance assessment: Needs assistance Sitting-balance support: Feet supported Sitting balance-Leahy Scale: Poor Sitting balance - Comments: worked on achieving midline posture and maintaining. pt with tendency to push with L UE. attempted R LE AAROM in sitting however unsuccesful.  Postural control: Right lateral lean   Standing balance-Leahy Scale:  Poor Standing balance comment: unable without physical assist x 2                           ADL either performed or assessed with clinical judgement   ADL Overall ADL's : Needs assistance/impaired Eating/Feeding: NPO   Grooming: Wash/dry face;Maximal assistance Grooming Details (indicate cue type and reason): hand over hand (A) and terminating task quickly                               General ADL Comments: Pt requires (A) for basic transfer this sessiona nd (A) to advance R LE     Vision       Perception     Praxis      Cognition Arousal/Alertness: Awake/alert Behavior During Therapy: Flat affect Overall Cognitive Status: Impaired/Different from baseline Area of Impairment: Following commands                   Current Attention Level: Focused   Following Commands: Follows one step commands inconsistently;Follows one step commands with increased time       General Comments: pt with global aphasia, flat affect, poor command follow but purposeful movement with L UE. pt with flat affect and no response at times with commands. pt with delayed response to command 25 seconds and inconsistent        Exercises     Shoulder Instructions       General Comments VSS    Pertinent Vitals/ Pain  Pain Assessment: No/denies pain Faces Pain Scale: No hurt  Home Living                                          Prior Functioning/Environment              Frequency  Min 2X/week        Progress Toward Goals  OT Goals(current goals can now be found in the care plan section)  Progress towards OT goals: Progressing toward goals  Acute Rehab OT Goals Patient Stated Goal: none stated due to aphasic OT Goal Formulation: Patient unable to participate in goal setting Time For Goal Achievement: 06/18/17 Potential to Achieve Goals: Good ADL Goals Pt Will Perform Grooming: with mod assist;sitting Pt Will Transfer to  Toilet: with mod assist;squat pivot transfer;bedside commode Additional ADL Goal #1: Pt will sustain attention to simple ADL task x 3 mins with min cues Additional ADL Goal #2: Pt will maintain EOB sitting with min A during simple grooming  Plan Discharge plan remains appropriate    Co-evaluation    PT/OT/SLP Co-Evaluation/Treatment: Yes Reason for Co-Treatment: Complexity of the patient's impairments (multi-system involvement);Necessary to address cognition/behavior during functional activity;For patient/therapist safety;To address functional/ADL transfers PT goals addressed during session: Mobility/safety with mobility OT goals addressed during session: ADL's and self-care;Proper use of Adaptive equipment and DME;Strengthening/ROM      AM-PAC PT "6 Clicks" Daily Activity     Outcome Measure   Help from another person eating meals?: Total Help from another person taking care of personal grooming?: Total Help from another person toileting, which includes using toliet, bedpan, or urinal?: A Lot Help from another person bathing (including washing, rinsing, drying)?: Total Help from another person to put on and taking off regular upper body clothing?: Total Help from another person to put on and taking off regular lower body clothing?: Total 6 Click Score: 7    End of Session Equipment Utilized During Treatment: Gait belt  OT Visit Diagnosis: Hemiplegia and hemiparesis;Cognitive communication deficit (R41.841) Symptoms and signs involving cognitive functions: Nontraumatic intracerebral hemorrhage Hemiplegia - Right/Left: Right Hemiplegia - dominant/non-dominant: Dominant Hemiplegia - caused by: Nontraumatic intracerebral hemorrhage   Activity Tolerance Patient tolerated treatment well   Patient Left in chair;with call bell/phone within reach;with chair alarm set;with restraints reapplied   Nurse Communication Mobility status;Precautions        Time: 1126-1150 OT Time  Calculation (min): 24 min  Charges: OT General Charges $OT Visit: 1 Visit OT Treatments $Therapeutic Activity: 8-22 mins   Jeri Modena   OTR/L Pager: [phone] Office: [phone] .    Parke Poisson B 06/12/2017, 2:51 PM

## 2017-06-12 NOTE — Plan of Care (Signed)
Pt nonverbal, unable to communicate needs d/t aphasia

## 2017-06-12 NOTE — Progress Notes (Signed)
  Speech Language Pathology Treatment: Dysphagia;Cognitive-Linquistic  Patient Details Name: Jason Callahan MRN: [id number] DOB: 1940-09-11 Today's Date: 06/12/2017 Time: 6387-5643 SLP Time Calculation (min) (ACUTE ONLY): 24 min  Assessment / Plan / Recommendation Clinical Impression  Pt demonstrates improved ability to stay awake today. Regardelss, pt did not initiate any functional or communicative acts. Max tactile cues needed to elicit initiation of face washing an oral acceptance of PO. With sips of water, puree pt opened mouth, allowed placement of PO, though behavior was entirely passive with verbal tactile cues to attend to bolus pt did initiate oral transit and swallow response in 75% of trials without signs of aspiration. Pt may potentially participate in a diet with a goal of comfort, though he would be unlikely to nutritionally support himself due to no interest in oral intake and only brief periods of appropriate arousal at this point. Discussed with MD and family.   HPI HPI: 77 year old gentleman who was admitted with sudden onset of aphasia and right hemiplegia. CT scan showed a left frontal hemorrhage.       SLP Plan  Continue with current plan of care       Recommendations  Diet recommendations: NPO                Oral Care Recommendations: Oral care QID Plan: Continue with current plan of care       Jason Teva Bronkema, MA CCC-SLP [phone]  Lynann Beaver 06/12/2017, 2:49 PM

## 2017-06-12 NOTE — Progress Notes (Addendum)
STROKE TEAM PROGRESS NOTE   SUBJECTIVE (INTERVAL HISTORY) No family present.  Patient more alert today and interactive.  Enjoys laying on his side.  Not transferred to floor as no telemetry bed available since yesterday's transfer order.Patient is more alert today but still did not do well with his bedside swallow evaluation by speech therapist  OBJECTIVE Temp:  [98.3 F (36.8 C)-99 F (37.2 C)] 98.9 F (37.2 C) (05/29 0801) Pulse Rate:  [67-103] 88 (05/29 0800) Cardiac Rhythm: Normal sinus rhythm (05/28 2000) Resp:  [9-26] 19 (05/29 0800) BP: (93-203)/(58-117) 127/77 (05/29 0800) SpO2:  [88 %-100 %] 96 % (05/29 0800)  Recent Labs  Lab 06/11/17 1518 06/11/17 1944 06/11/17 2337 06/12/17 0326 06/12/17 0744  GLUCAP 129* 149* 124* 97 147*   Recent Labs  Lab 06/05/17 1235 06/05/17 1700  06/06/17 0534  06/06/17 1512  06/09/17 0414 06/09/17 1347 06/09/17 1945 06/10/17 0516 06/11/17 0713  NA 158* 157*   < > 159*  160*   < > 160*   < > 149* 150* 148* 149* 148*  K  --   --    < > 3.2*  --  3.9   < > 2.6* 4.1 3.9 3.6 4.3  CL  --   --    < > 123*  --  124*   < > 112* 112* 113* 114* 115*  CO2  --   --    < > 28  --  28   < > 30 28 28 29 25   GLUCOSE  --   --    < > 164*  --  159*   < > 168* 152* 140* 113* 148*  BUN  --   --    < > 35*  --  45*   < > 31* 32* 33* 35* 36*  CREATININE  --   --    < > 1.27*  --  1.40*   < > 1.02 1.09 1.05 1.08 1.04  CALCIUM  --   --    < > 9.2  --  9.0   < > 8.6* 8.8* 8.4* 8.6* 8.8*  MG 2.3 2.3  --  2.2  --  2.3  --   --   --   --   --   --   PHOS 2.6 1.7*  --  2.8  --  3.2  --   --   --   --   --   --    < > = values in this interval not displayed.   Recent Labs  Lab 06/07/17 0415 06/09/17 0414 06/10/17 0516 06/11/17 0713 06/12/17 0220  WBC 15.7* 16.8* 17.2* 18.8* 17.3*  NEUTROABS 11.0* 10.5* 10.3* 11.3* 9.4*  HGB 14.0 15.3 14.1 14.3 13.7  HCT 43.6 46.1 43.1 43.2 42.1  MCV 100.2* 98.3 98.6 98.2 96.8  PLT 143* 139* 136* 167 191     PHYSICAL EXAM per Dr. Madison Hickman - Well nourished, well developed, alert.   Cardiovascular - Regular rate and rhythm.  Neuro - awake.  Blinks on the left not on the right.  Global aphasia without speech, not following commands. Will mimic gestures on the L. Left pupil round 2.76mm, right surgical pupil dilated with irregular sharp, left gaze preference, not cross midline. Blinking to visual threat on the left but not on the right. Right facial droop and tongue midline in mouth. Moves L arm and leg purposefully, able to hold against gravity. RUE flaccid without withdrawal and RLE 1/5 withdraw  on pain stimulation. DTR 1+ and right babinski positive. Coordination and gait not tested.   ASSESSMENT/PLAN [name] is a 77 y.o. 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 No tPA given due to [place].    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   aspirin 81 mg daily prior to admission, now on No antithrombotic d/t hmg  Ongoing aggressive stroke risk factor management  Therapy recommendations:  CIR  Disposition:  Pending  Awaiting transfer to floor. 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  Elevated at times.  BP goal < 180   Previously treated with cleviprex  on losartan 100mg  daily, amlodipine 10, and coreg 25 bid, hydralazine 25 tid -> hydralazine discontinued  Monitor.  Amlodipine will be next drug to stop.  Discussed with RN  Hyperlipidemia  Home meds:  crestor   LDL 42, goal < 70  Hold off statin for now due to acute ICH  Dysphagia secondary to stroke  Cortrak placed  On TF   Will see if able to swallow. If now, wife will have to decide if she wants PEG for him in order to go to SNF  Other Stroke Risk Factors  Advanced age  PVD  Obstructive sleep apnea, on CPAP at home.   Other Active Problems  Hyperglycemia  Hypokalemia resolved, 4.3  Diarrhea per nursing - secondary to TF -> rectal tube and imodium prn/ Check stool for C difficile if diarrhea continues.  Mild thrombocytopenia - 167  Mild AKI, resolved  Hospital day # Christine, MSN, APRN, ANVP-BC, AGPCNP-BC Advanced Practice Stroke Nurse [place] for Schedule & Pager information 06/12/2017 8:45 AM  I had a long discussion the patient and daughter at the bedside about his prognosis and family seems quite realistic and the clear that patient would not want a PEG tube and also prolonged stay in the nursing home which is unavoidable at this time. Even though he seems more alert today is still not done with swallowing. Family  is willing to meet with palliative care  to discuss goals of care and possibly transition him to comfort care over the next few days. Discussed with wife, daughter, speech therapist and Dr. Marolyn Hammock.This patient is critically ill and at significant risk of neurological worsening, death and care requires constant monitoring of vital signs, hemodynamics,respiratory and cardiac monitoring, extensive review of multiple databases, frequent neurological assessment, discussion with family, other specialists and medical decision making of high complexity.I have made any additions or clarifications directly to the above note.This critical care time does not reflect procedure time, or teaching time or supervisory time of PA/NP/Med Resident etc but could involve care discussion time.  I spent 30 minutes of neurocritical care time  in the care of  this patient. Jason Contras, MD Medical Director [place] Pager: [phone] 06/12/2017 4:18 PM      To contact Stroke Continuity provider, please refer to [email or web address] After hours, contact General Neurology

## 2017-06-12 NOTE — Progress Notes (Signed)
Physical Therapy Treatment Patient Details Name: Jason Callahan MRN: [id number] DOB: 03-Jan-1941 Today's Date: 06/12/2017    History of Present Illness This 77 y.o. male admitted with acute onset Rt sided weakness, Rt facial droop, and aphasia.   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  PMH includes:  PVD, LVH, Gout. s/p CEA.      PT Comments    Pt con't to have R sided neglect, R hemiplegia, global aphasia and requires maxAX2 for all mobility. Acute PT to con't to follow.   Follow Up Recommendations  CIR;Supervision/Assistance - 24 hour     Equipment Recommendations  Other (comment)    Recommendations for Other Services Rehab consult     Precautions / Restrictions Precautions Precautions: Fall Restrictions Weight Bearing Restrictions: No    Mobility  Bed Mobility Overal bed mobility: Needs Assistance Bed Mobility: Supine to Sit     Supine to sit: Max assist;+2 for physical assistance     General bed mobility comments: pt moved L LE and attempted to use L UE however required maxA for trunk elevation and to scoot to EOB  Transfers Overall transfer level: Needs assistance Equipment used: None Transfers: Sit to/from [employer] Sit to Stand: Max assist;+2 safety/equipment Stand pivot transfers: Max assist;+2 physical assistance;+2 safety/equipment       General transfer comment: pt with no active R LE movment requiring R knee blocked, maxA to advance LEs  Ambulation/Gait             General Gait Details: unable   Stairs             Wheelchair Mobility    Modified Rankin (Stroke Patients Only) Modified Rankin (Stroke Patients Only) Pre-Morbid Rankin Score: No symptoms Modified Rankin: Severe disability     Balance Overall balance assessment: Needs assistance Sitting-balance support: Feet supported Sitting balance-Leahy Scale: Poor Sitting balance - Comments: worked on achieving  midline posture and maintaining. pt with tendency to push with L UE. attempted R LE AAROM in sitting however unsuccesful.  Postural control: Right lateral lean   Standing balance-Leahy Scale: Poor Standing balance comment: unable without physical assist x 2                            Cognition Arousal/Alertness: Awake/alert Behavior During Therapy: Flat affect Overall Cognitive Status: Impaired/Different from baseline Area of Impairment: Following commands                   Current Attention Level: Focused   Following Commands: Follows one step commands inconsistently;Follows one step commands with increased time       General Comments: pt with global aphasia, flat affect, poor command follow but purposeful movement with L UE      Exercises      General Comments General comments (skin integrity, edema, etc.): VSS, noted R sided neglect      Pertinent Vitals/Pain Pain Assessment: Faces Faces Pain Scale: No hurt    Home Living                      Prior Function            PT Goals (current goals can now be found in the care plan section) Progress towards PT goals: Progressing toward goals    Frequency    Min 4X/week      PT Plan Current plan remains appropriate  Co-evaluation PT/OT/SLP Co-Evaluation/Treatment: Yes Reason for Co-Treatment: Complexity of the patient's impairments (multi-system involvement) PT goals addressed during session: Mobility/safety with mobility        AM-PAC PT "6 Clicks" Daily Activity  Outcome Measure  Difficulty turning over in bed (including adjusting bedclothes, sheets and blankets)?: Unable Difficulty moving from lying on back to sitting on the side of the bed? : Unable Difficulty sitting down on and standing up from a chair with arms (e.g., wheelchair, bedside commode, etc,.)?: Unable Help needed moving to and from a bed to chair (including a wheelchair)?: Total Help needed walking in  hospital room?: Total Help needed climbing 3-5 steps with a railing? : Total 6 Click Score: 6    End of Session Equipment Utilized During Treatment: Gait belt Activity Tolerance: Patient tolerated treatment well Patient left: in chair;with call bell/phone within reach;with chair alarm set Nurse Communication: Mobility status PT Visit Diagnosis: Other abnormalities of gait and mobility (R26.89);Hemiplegia and hemiparesis Hemiplegia - Right/Left: Right Hemiplegia - dominant/non-dominant: Dominant Hemiplegia - caused by: Nontraumatic intracerebral hemorrhage     Time: 1125-1150 PT Time Calculation (min) (ACUTE ONLY): 25 min  Charges:  $Neuromuscular Re-education: 8-22 mins                    G Codes:       Kittie Plater, PT, DPT Pager #: [phone] Office #: [phone]    [place] 06/12/2017, 2:06 PM

## 2017-06-13 DIAGNOSIS — R451 Restlessness and agitation: Secondary | ICD-10-CM

## 2017-06-13 DIAGNOSIS — Z515 Encounter for palliative care: Secondary | ICD-10-CM

## 2017-06-13 DIAGNOSIS — Z7189 Other specified counseling: Secondary | ICD-10-CM

## 2017-06-13 LAB — CBC WITH DIFFERENTIAL/PLATELET
BASOS PCT: 1 %
Basophils Absolute: 0.2 10*3/uL — ABNORMAL HIGH (ref 0.0–0.1)
EOS PCT: 5 %
Eosinophils Absolute: 1.1 10*3/uL — ABNORMAL HIGH (ref 0.0–0.7)
HEMATOCRIT: 43.8 % (ref 39.0–52.0)
Hemoglobin: 14.5 g/dL (ref 13.0–17.0)
LYMPHS ABS: 6.6 10*3/uL — AB (ref 0.7–4.0)
Lymphocytes Relative: 31 %
MCH: 31.9 pg (ref 26.0–34.0)
MCHC: 33.1 g/dL (ref 30.0–36.0)
MCV: 96.5 fL (ref 78.0–100.0)
MONO ABS: 1.3 10*3/uL — AB (ref 0.1–1.0)
Monocytes Relative: 6 %
NEUTROS ABS: 12.1 10*3/uL — AB (ref 1.7–7.7)
Neutrophils Relative %: 57 %
Platelets: 229 10*3/uL (ref 150–400)
RBC: 4.54 MIL/uL (ref 4.22–5.81)
RDW: 12 % (ref 11.5–15.5)
WBC: 21.3 10*3/uL — AB (ref 4.0–10.5)

## 2017-06-13 LAB — GLUCOSE, CAPILLARY
Glucose-Capillary: 136 mg/dL — ABNORMAL HIGH (ref 65–99)
Glucose-Capillary: 118 mg/dL — ABNORMAL HIGH (ref 65–99)
Glucose-Capillary: 125 mg/dL — ABNORMAL HIGH (ref 65–99)
Glucose-Capillary: 141 mg/dL — ABNORMAL HIGH (ref 65–99)

## 2017-06-13 MED ORDER — BIOTENE DRY MOUTH MT LIQD: 15.0000 mL | OROMUCOSAL | Status: DC | PRN

## 2017-06-13 MED ORDER — GLYCOPYRROLATE 0.2 MG/ML IJ SOLN
0.2000 mg | INTRAMUSCULAR | Status: DC | PRN
  Administered 2017-06-14: 0.2 mg via INTRAVENOUS
  Filled 2017-06-13 (×2): qty 2

## 2017-06-13 MED ORDER — LORAZEPAM 2 MG/ML IJ SOLN
0.5000 mg | INTRAMUSCULAR | Status: DC
  Administered 2017-06-13 – 2017-06-14 (×3): 1 mg via INTRAVENOUS
  Administered 2017-06-14: 0.5 mg via INTRAVENOUS
  Administered 2017-06-14 (×2): 1 mg via INTRAVENOUS
  Filled 2017-06-13 (×6): qty 1

## 2017-06-13 MED ORDER — MORPHINE SULFATE (PF) 4 MG/ML IV SOLN
1.0000 mg | INTRAVENOUS | Status: DC | PRN
  Administered 2017-06-14 (×3): 2 mg via INTRAVENOUS
  Filled 2017-06-13 (×3): qty 1

## 2017-06-13 MED ORDER — LORAZEPAM 2 MG/ML IJ SOLN
1.0000 mg | INTRAMUSCULAR | Status: DC | PRN
  Filled 2017-06-13: qty 1

## 2017-06-13 MED ORDER — HALOPERIDOL LACTATE 5 MG/ML IJ SOLN: 2.0000 mg | Freq: Four times a day (QID) | INTRAMUSCULAR | Status: DC | PRN

## 2017-06-13 MED ORDER — LOPERAMIDE HCL 1 MG/7.5ML PO SUSP
2.0000 mg | ORAL | Status: DC | PRN
  Filled 2017-06-13: qty 15

## 2017-06-13 MED ORDER — LOPERAMIDE HCL 1 MG/7.5ML PO SUSP
4.0000 mg | ORAL | Status: DC | PRN
  Administered 2017-06-14 (×2): 4 mg via ORAL
  Filled 2017-06-13 (×2): qty 30

## 2017-06-13 MED ORDER — POLYVINYL ALCOHOL 1.4 % OP SOLN
1.0000 [drp] | Freq: Four times a day (QID) | OPHTHALMIC | Status: DC | PRN
  Administered 2017-06-14: 1 [drp] via OPHTHALMIC
  Filled 2017-06-13: qty 15

## 2017-06-13 NOTE — Consult Note (Addendum)
Consultation Note Date: 06/13/2017   Patient Name: Jason Callahan  DOB: February 17, 1940  MRN: [id number]  Age / Sex: 77 y.o., male  PCP: Jason Infante, MD Referring Physician: Garvin Fila, MD  Reason for Consultation: Establishing goals of care, Psychosocial/spiritual support and Terminal Care  HPI/Patient Profile: 77 y.o. male  with past medical history of carotid occlusion s/p CEA 2000, HTN, PVD, OSA on CPAP admitted on 05/25/2017 with large left frontal intracranial hemorrhage. He has had poor progress and lack of improvement over hospitalization. Palliative care consulted for goals of care.   Clinical Assessment and Goals of Care: I had a long conversation with Jason Callahan wife, son, and daughter today. They are very tearful but share with me that he has always been very physically and mentally active. He values independence and dignity. Wife shares that he recently read a book that prompted end of life discussions in which he clearly expressed he would not want his life prolonged in these conditions. All family agree that they do not wish to pursue PEG and plan to transition to comfort care.  We discussed symptom management for comfort (they have not allowed visitors to protect his dignity as he has been agitated and pulling sheets off), comfort care, transition out of ICU to 6N and transition to hospice facility. They struggle with timing of removing NGT/feeding but wnt him comfortable and to plan for his siblings to come visit. One brother is coming from [place] but is standing by to buy plane ticket (I encouraged them to tell him to proceed with arranging flight ASAP). They would like to discuss further with CSW about [employer] (arranged meeting with Jason Callahan, Jason Callahan tomorrow 10am).   All questions/concerns addressed. Emotional support provided.   Primary Decision Maker NEXT OF KIN wife     SUMMARY OF RECOMMENDATIONS   - Transitioning to comfort care - Plan to meet with [place] Callahan tomorrow 10am  Code Status/Advance Care Planning:  DNR   Symptom Management:   Agitation: Ativan 0.5-1 mg every 4 hours scheduled. Ativan 1 mg every 2 hours prn. Haldol prn if Ativan ineffective. May increase dose/frequency as needed to achieve comfort.   SOB/pain: Morphine 1-2 mg every hour prn.   Robinul prn secretions.   Imodium prn diarrhea (hopefully may d/c rectal tube).   Minimizing medications for comfort.   Comfort feeds is awake and desired.    Palliative Prophylaxis:   Aspiration, Bowel Regimen, Delirium Protocol, Frequent Pain Assessment, Oral Care and Turn Reposition  Additional Recommendations (Limitations, Scope, Preferences):  Full Comfort Care  Psycho-social/Spiritual:   Desire for further Chaplaincy support:yes  Additional Recommendations: Education on Hospice and Grief/Bereavement Support  Prognosis:   < 2 weeks  Discharge Planning: To Be Determined      Primary Diagnoses: Present on Admission: . ICH (intracerebral hemorrhage) ([name])   I have reviewed the medical record, interviewed the patient and family, and examined the patient. The following aspects are pertinent.  Past Medical History:  Diagnosis Date  .  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 Diverticulitis   . GERD (gastroesophageal reflux disease)   . Gout   . Hernia, inguinal, left   . Hypertension   . LVH (left ventricular hypertrophy)   . Nephrolithiasis   . Osteopenia   . PVD (peripheral vascular disease) ([place])   . Snoring 02/10/2014   Social History   Socioeconomic History  . Marital status: Married    Spouse name: Jason Callahan  . Number of children: 2  . Years of education: college gr  . Highest education level: Not on file  Occupational History  . Occupation: retired  Scientific laboratory technician  . Financial resource strain: Not on file  . Food insecurity:    Worry: Not on  file    Inability: Not on file  . Transportation needs:    Medical: Not on file    Non-medical: Not on file  Tobacco Use  . Smoking status: Never Smoker  . Smokeless tobacco: Never Used  Substance and Sexual Activity  . Alcohol use: Yes    Alcohol/week: 4.2 oz    Types: 7 Standard drinks or equivalent per week    Comment: Occassional  . Drug use: No  . Sexual activity: Not on file  Lifestyle  . Physical activity:    Days per week: Not on file    Minutes per session: Not on file  . Stress: Not on file  Relationships  . Social connections:    Talks on phone: Not on file    Gets together: Not on file    Attends religious service: Not on file    Active member of club or organization: Not on file    Attends meetings of clubs or organizations: Not on file    Relationship status: Not on file  Other Topics Concern  . Not on file  Social History Narrative   Denies caffeine use.   Family History  Problem Relation Age of Onset  . Heart disease Father   . Arthritis Mother    Scheduled Meds: . acetaminophen  650 mg Rectal Q4H  . LORazepam  0.5-1 mg Intravenous Q4H  . mouth rinse  15 mL Mouth Rinse BID   Continuous Infusions: PRN Meds:.antiseptic oral rinse, glycopyrrolate, haloperidol lactate, LORazepam, morphine injection, polyvinyl alcohol No Known Allergies Review of Systems  Unable to perform ROS: Acuity of condition    Physical Exam  Constitutional: He appears well-developed.  HENT:  Head: Normocephalic and atraumatic.  Cardiovascular: Normal rate and regular rhythm.  Pulmonary/Chest: Effort normal. No accessory muscle usage. No tachypnea. No respiratory distress.  Abdominal: Soft. Normal appearance.  Neurological:  Alert but agitated with any stimulation  Nursing note and vitals reviewed.   Vital Signs: BP 100/78   Pulse 71   Temp (!) 97.4 F (36.3 C) (Axillary)   Resp 17   Ht 5\' 10"  (1.778 m)   Wt 74.9 kg (165 lb 2 oz)   SpO2 97%   BMI 23.69 kg/m    Pain Scale: CPOT POSS *See Group Information*: 1-Acceptable,Awake and alert Pain Score: Asleep   SpO2: SpO2: 97 % O2 Device:SpO2: 97 % O2 Flow Rate: .O2 Flow Rate (L/min): 2 L/min  IO: Intake/output summary:   Intake/Output Summary (Last 24 hours) at 06/13/2017 1543 Last data filed at 06/13/2017 1400 Gross per 24 hour  Intake 2350 ml  Output 1820 ml  Net 530 ml    LBM: Last BM Date: 06/12/17 Baseline Weight: Weight: 84.5 kg (186 lb 4.6 oz) Most recent weight: Weight:  74.9 kg (165 lb 2 oz)     Palliative Assessment/Data: 20%    Time Total: 80 min  Greater than 50%  of this time was spent counseling and coordinating care related to the above assessment and plan.  Signed by: Vinie Sill, NP Palliative Medicine Team Pager # [phone] (M-F 8a-5p) Team Phone # [phone] (Nights/Weekends)

## 2017-06-13 NOTE — Progress Notes (Signed)
Dr. Leonie Man met with family. They desire full comfort care. Orders placed. Will have SW consult for residential placement.  Burnetta Sabin, MSN, APRN, ANVP-BC, AGPCNP-BC Advanced Practice Stroke Nurse [place] for Schedule & Pager information 06/13/2017 3:22 PM

## 2017-06-13 NOTE — Progress Notes (Signed)
Inpatient Rehabilitation Admissions Coordinator  Noted plans to meet with palliative care today. If comfort care is chosen, we will sign off.  Danne Baxter, RN, MSN Rehab Admissions Coordinator [phone] 06/13/2017 12:53 PM

## 2017-06-13 NOTE — Progress Notes (Signed)
  Speech Language Pathology Treatment: Dysphagia  Patient Details Name: Jason Callahan MRN: [id number] DOB: 1940/11/25 Today's Date: 06/13/2017 Time: 4742-5956 SLP Time Calculation (min) (ACUTE ONLY): 16 min  Assessment / Plan / Recommendation Clinical Impression  Pt more alert this am, more responsive to environment and visual cues. Pt will actively reach and grasp cup, but needs hand over hand assist to initiate movement of cup to mouth. Consistently accepted sips of water with hand over hand and total assist feeding today. More automatic and responsive. Visually tracked spoon with puree to lips and opened mouth in anticipation. Did not orally hold boluses. Pt also nodded x2 with Y/N intonation questions and smiled in response to a smile. Prognosis for adequate nutrition still very poor, but pt appears able to tolerate sips of thin liquids and bites of puree enough to progress to diet. Will initiate a dys 1 (puree) diet with thin liquids and discussed appropriate attempts to feed with RN. Family not present to discuss, but if their decisions conflict with this plan, ok to resume strict NPO.   HPI        SLP Plan  Continue with current plan of care       Recommendations  Diet recommendations: Dysphagia 1 (puree);Thin liquid Liquids provided via: Cup Medication Administration: Via alternative means Supervision: Staff to assist with self feeding;Full supervision/cueing for compensatory strategies Compensations: Slow rate;Small sips/bites Postural Changes and/or Swallow Maneuvers: Seated upright 90 degrees                Oral Care Recommendations: Oral care QID Follow up Recommendations: Skilled Nursing facility SLP Visit Diagnosis: Dysphagia, unspecified (R13.10) Plan: Continue with current plan of care       GO               [facility], [place] CCC-SLP [phone]  Lynann Beaver 06/13/2017, 11:03 AM

## 2017-06-13 NOTE — Progress Notes (Signed)
OT Cancellation and Discharge Note  Patient Details Name: Jason Callahan MRN: [id number] DOB: 07/05/1940   Cancelled Treatment:    Reason Eval/Treat Not Completed: pt has transitioned to full comfort care.  [place], OTR/L [phone]   Lucille Passy M 06/13/2017, 3:42 PM

## 2017-06-13 NOTE — Progress Notes (Signed)
PT Cancellation Note  Patient Details Name: Jason Callahan MRN: [id number] DOB: 1940/10/11   Cancelled Treatment:    Reason Eval/Treat Not Completed: Other (comment)(Family is having a palliative care mtg today.  They wish to hold therapies today while they discuss what they as a family want to do for pt's care based on his known wishes.  Will see as appropriate and as able 5/31. 06/13/2017  Donnella Sham, [place] [phone]  (pager)   [name] Giamarie Bueche 06/13/2017, 2:23 PM

## 2017-06-13 NOTE — Progress Notes (Addendum)
STROKE TEAM PROGRESS NOTE   SUBJECTIVE (INTERVAL HISTORY) No family present.  No significant change from yesterday.  Continues to await transfer to the floor.  Family meeting with palliative care.  OBJECTIVE Temp:  [97.1 F (36.2 C)-99.7 F (37.6 C)] 97.4 F (36.3 C) (05/30 1225) Pulse Rate:  [52-106] 71 (05/30 1400) Cardiac Rhythm: Normal sinus rhythm (05/30 1200) Resp:  [10-27] 17 (05/30 1400) BP: (94-168)/(55-97) 100/78 (05/30 1400) SpO2:  [93 %-100 %] 97 % (05/30 1400) Weight:  [74.9 kg (165 lb 2 oz)] 74.9 kg (165 lb 2 oz) (05/30 0412)  Recent Labs  Lab 06/12/17 1921 06/12/17 2336 06/13/17 0328 06/13/17 0815 06/13/17 1259  GLUCAP 140* 107* 136* 118* 125*   Recent Labs  Lab 06/06/17 1512  06/09/17 0414 06/09/17 1347 06/09/17 1945 06/10/17 0516 06/11/17 0713  NA 160*   < > 149* 150* 148* 149* 148*  K 3.9   < > 2.6* 4.1 3.9 3.6 4.3  CL 124*   < > 112* 112* 113* 114* 115*  CO2 28   < > '30 28 28 29 25  ' GLUCOSE 159*   < > 168* 152* 140* 113* 148*  BUN 45*   < > 31* 32* 33* 35* 36*  CREATININE 1.40*   < > 1.02 1.09 1.05 1.08 1.04  CALCIUM 9.0   < > 8.6* 8.8* 8.4* 8.6* 8.8*  MG 2.3  --   --   --   --   --   --   PHOS 3.2  --   --   --   --   --   --    < > = values in this interval not displayed.   Recent Labs  Lab 06/09/17 0414 06/10/17 0516 06/11/17 0713 06/12/17 0220 06/13/17 0306  WBC 16.8* 17.2* 18.8* 17.3* 21.3*  NEUTROABS 10.5* 10.3* 11.3* 9.4* 12.1*  HGB 15.3 14.1 14.3 13.7 14.5  HCT 46.1 43.1 43.2 42.1 43.8  MCV 98.3 98.6 98.2 96.8 96.5  PLT 139* 136* 167 191 229    PHYSICAL EXAM per Dr. Madison Hickman - Well nourished, well developed, alert.   Cardiovascular - Regular rate and rhythm.  Neuro - awake.  Blinks on the left not on the right.  Global aphasia without speech, not following commands. Will mimic gestures on the L. Left pupil round 2.14m, right surgical pupil dilated with irregular sharp, left gaze preference, not cross midline. Blinking  to visual threat on the left but not on the right. Right facial droop and tongue midline in mouth. Moves L arm and leg purposefully, able to hold against gravity. RUE flaccid without withdrawal and RLE 1/5 withdraw on pain stimulation. DTR 1+ and right babinski positive. Coordination and gait not tested.   ASSESSMENT/PLAN Mr. EDAEVON HOLDRENis a 77y.o. 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   Diet: D1 thin liquids  aspirin 81 mg daily prior to admission, now on No antithrombotic d/t hmg  Ongoing aggressive stroke risk factor management  Therapy recommendations:  CIR  Disposition:  Pending  Continue to await transfer to floor (3rd day)  Palliative care meeting with family today to discuss goals of care  Cerebral edema  CT showed left frontal ICH with mass effect  Treated with 3%, now off and on NS  Last Na 148   CT repeat 06/10/17 stable hematoma and midline shift  Fever, reolsved  T MAX 101.4 -> 98.9  BCx NGTD  CXR s x 3 unremarkable  UA repeat neg  On vanco (now off)   On zosyn (started 06/06/17) (Day #7) complete 7-d course  Carotid stenosis  Chronic left ICA occlusion  Right ICA stenosis s/p CEA 2000  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  Elevated at times.  BP goal < 180   Previously treated with cleviprex  on losartan 15m daily, amlodipine 10, and coreg 25 bid, hydralazine 25 tid -> hydralazine discontinued 5/29  BP continues to be low. D/c Amlodipine 5/30  Monitor BP  Hyperlipidemia  Home meds:  crestor   LDL 42, goal < 70  Hold off statin for now due to acute ICH  Dysphagia secondary to stroke  Cortrak placed  On TF   Will see if able to swallow. If now, wife will have to decide if she wants PEG for him in order to go to SNF  Other Stroke Risk Factors  Advanced age  PVD  Obstructive sleep apnea, on CPAP at home.   Other Active Problems  Hyperglycemia  Hypokalemia resolved, 4.3  Diarrhea per nursing - secondary to TF -> rectal tube and imodium prn  Mild thrombocytopenia - 167  Mild AKI, resolved  Hospital day # [place] MSN, APRN, ANVP-BC, AGPCNP-BC Advanced Practice Stroke Nurse [place] & Pager information 06/13/2017 2:14 PM   I met with the patient's wife, son and daughter after rounds this afternoon along with the palliative care team nurse practitioner and discussed patient's prognosis and family agrees to DO NOT RESUSCITATE and full comfort care and palliative care as they feel patient would not want PEG tube for feeding and prolonged stay in the nursing home with significant poor quality of life which seems unavoidable. [place] [facility] [place] over the next few days.This patient is critically ill and at significant risk of neurological worsening, death and care requires constant monitoring of vital signs, hemodynamics,respiratory and cardiac monitoring, extensive review of  multiple databases, frequent neurological assessment, discussion with family, other specialists and medical decision making of high complexity.I have made any additions or clarifications directly to the above note.This critical care time does not reflect procedure time, or teaching time or supervisory time of PA/NP/Med Resident etc but could involve care discussion time.  I spent 30 minutes of neurocritical care time  in the care of  this patient.    PAntony Contras MD     To contact Stroke Continuity provider, please  refer to [email or web address] After hours, contact General Neurology

## 2017-06-14 DIAGNOSIS — Z7189 Other specified counseling: Secondary | ICD-10-CM

## 2017-06-14 DIAGNOSIS — Z515 Encounter for palliative care: Secondary | ICD-10-CM

## 2017-06-14 MED ORDER — ACETAMINOPHEN 160 MG/5ML PO SOLN: 650.0000 mg | Freq: Three times a day (TID) | ORAL | Status: DC | PRN

## 2017-06-15 NOTE — Care Management Note (Signed)
Case Management Note  Patient Details  Name: Jason Callahan MRN: [id number] Date of Birth: 1940/10/12  Subjective/Objective:   This 77 y.o. male admitted with acute onset Rt sided weakness, Rt facial droop, and aphasia.   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  Pt independent PTA, lives with wife.                  Action/Plan: Met with pt and wife to discuss dc planning.  PT/OT recommending CIR.  Wife states she can provide 24h care at dc, and will have assistance from other family members and friends.  We discussed possible CIR, with backup plan of SNF,depending on progress.  She verbalizes understanding of difference between CIR and SNF.  Will follow progress.  Expected Discharge Date:  06/10/17               Expected Discharge Plan:     In-House Referral:  Clinical Social Work  Discharge planning Services  CM Consult  Post Acute Care Choice:    Choice offered to:     DME Arranged:    DME Agency:     HH Arranged:    HH Agency:     Status of Service:  In process, will continue to follow  If discussed at Long Length of Stay Meetings, dates discussed:    Additional Comments:  July 07, 2017 J. Donyetta Ogletree, RN, BSN Family has chosen full comfort care at this time.  Reinaldo Raddle, RN, BSN  Trauma/Neuro ICU Case Manager [phone]

## 2017-06-15 NOTE — Consult Note (Signed)
[facility])  Met with patients' spouse, son and daughter this morning to explain [employer] philosophy of care, staffing, insurance coverage, financial charges and answer questions. Family, CSW and bedside RN aware [place] does have a room to offer at this time. They are aware [facility] liaison will update spouse and CSW if room becomes available. Encouraged them to tour facility or review online.   Thank you,  Erling Conte, LCSW [phone]

## 2017-06-15 NOTE — Progress Notes (Signed)
Family arrived to patient's bedside around 1000. Family and RN met with Art therapist. No room available at [facility]. CSW to keep staff and family informed of availability. Plan to transfer patient to 6N for comfort care. At 1100, family requested to remove all monitoring equipment and NG tube. Per palliative team and family, RN removed all equipment as stated above and transitioned patient to full comfort care.   Scheduled and PRN medications given to patient for symptom management. See MAR for medication details. Children of patient left the hospital s/p [place]. Patient's wife remained at bedside with RN closely monitoring patient for comfort and change in status.   Around 1200, patient began to have some occasional snoring respirations despite medications being given. Palliative care NP contacted for guidance. NP requested to put rolled towel behind neck to allow the airway to be opened up more. Patient was repositioned as per NPs request. RN and wife remained at patient's bedside. Around 1230, RN appreciated a change in respirations and notified the wife of this finding. RN told wife to call family that she wanted present. Around 1250, patient began to exhibit apneustic type breathing. At 1254, respirations ceased. However, heart sounds were still auscultated. At 1255, no heart sounds were auscultated. Time of death at 95. Pronounced by Weston Brass, RN and Budd Palmer, RN. RN stayed with wife at the bedside to provide comfort until the rest of the family arrived. Shortly after patient's passing, palliative care NP arrived on unit and provided comfort to the family. Attending MD notified of death. Family was very appreciative of the care and respect that was given to them and the patient.

## 2017-06-15 NOTE — Progress Notes (Signed)
Daily Progress Note   Patient Name: Jason Callahan       Date: 2017/07/11 DOB: [birth year]  Age: 77 y.o. MRN#: [id number] Attending Physician: Garvin Fila, MD Primary Care Physician: Crist Infante, MD Admit Date: 06/01/2017  Reason for Consultation/Follow-up: Terminal Care  Subjective: Patient in bed, appears slightly agitated, pulling at bedclothes, moaning, brow furrowed. Spouse at bedside. Family en route. Spouse met with Harmon Pier from [facility]- no bed available to offer at this time. RN had just pulled out NG tube and spouse was thankful.   Review of Systems  Unable to perform ROS: Patient nonverbal    Length of Stay: 12  Current Medications: Scheduled Meds:  . LORazepam  0.5-1 mg Intravenous Q4H  . mouth rinse  15 mL Mouth Rinse BID    Continuous Infusions:   PRN Meds: acetaminophen (TYLENOL) oral liquid 160 mg/5 mL, antiseptic oral rinse, glycopyrrolate, haloperidol lactate, loperamide HCl, LORazepam, morphine injection, polyvinyl alcohol  Physical Exam  Constitutional: He appears well-developed and well-nourished.  Pulmonary/Chest:  Cough with secretions at times  Neurological:  Does not opens eyes, slightly agitated  Nursing note and vitals reviewed.           Vital Signs: BP (!) 162/73   Pulse (!) 117   Temp 98.3 F (36.8 C) (Axillary)   Resp (!) 29   Ht _0  (1.778 m)   Wt 74.9 kg (165 lb 2 oz)   SpO2 92%   BMI 23.69 kg/m  SpO2: SpO2: 92 % O2 Device: O2 Device: Room Air O2 Flow Rate: O2 Flow Rate (L/min): 2 L/min  Intake/output summary:   Intake/Output Summary (Last 24 hours) at 07-11-2017 1142 Last data filed at 07/11/2017 0800 Gross per 24 hour  Intake 765.33 ml  Output 1380 ml  Net -614.67 ml   LBM: Last BM Date: 06/13/17 Baseline Weight: Weight: 84.5  kg (186 lb 4.6 oz) Most recent weight: Weight: 74.9 kg (165 lb 2 oz)       Palliative Assessment/Data: PPS: 10%      Patient Active Problem List   Diagnosis Date Noted  . Agitation   . 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 Leukocytosis   . SIRS (systemic inflammatory response syndrome) (HCC)   . Tachypnea   . Tachycardia   .  OSA (obstructive sleep apnea)   . Lymphocytosis   . Steroid-induced hyperglycemia   . Encounter for central line care   . Encounter for feeding tube placement   . Fever   . SOB (shortness of breath)   . Cerebral edema ([place]) 06/03/2017  . Hypertensive emergency 06/03/2017  . ICH (intracerebral hemorrhage) ([place]) 06/13/2017  . 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 OSA on CPAP 06/15/2014  . Retrognathia 06/15/2014  . Nocturia more than twice per night 06/15/2014  . Sleep apnea 03/09/2014  . Carotid artery occlusion with infarction ([place]) 03/09/2014  . Snoring 02/10/2014  . Dislocated intraocular lens 05/07/2012  . Retinal detachment 05/07/2012    Palliative Care Assessment & Plan   Patient Profile:76 y.o. male  with past medical history of carotid occlusion s/p CEA 2000, HTN, PVD, OSA on CPAP admitted on 05/16/2017 with large left frontal intracranial hemorrhage. He has had poor progress and lack of improvement over hospitalization. Palliative care consulted for goals of care.   Assessment/Recommendations/Plan   Continue comfort care as ordered  Transfer to 6N while await residential hospice  PMT will continue to follow for symptom management and terminal care  Goals of Care and Additional Recommendations:  Limitations on Scope of Treatment: Full Comfort Care  Code Status:  DNR  Prognosis:   < 2 weeks  Discharge Planning:  To Be Determined - hopeful for residential hospice facility  Care plan was discussed with patient's spouse and  RN.  Thank you for allowing the Palliative Medicine Team to assist in the care of this patient.   Time In: 1115 Time Out: 1150 Total Time 35 mins Prolonged Time Billed no      Greater than 50%  of this time was spent counseling and coordinating care related to the above assessment and plan.  Jason Callahan, AGNP-C Palliative Medicine   Please contact Palliative Medicine Team phone at [phone] for questions and concerns.

## 2017-06-15 NOTE — Progress Notes (Signed)
Nutrition Brief Note  Chart reviewed. Pt now transitioning to comfort care.  No further nutrition interventions warranted at this time.  Please re-consult as needed.   Jo-Ann Johanning RD, LDN, CNSC [phone] Pager [phone] After Hours Pager    

## 2017-06-15 NOTE — Death Summary Note (Signed)
Patient ID: Jason Callahan MRN: [id number] DOB/AGE: 78/10/1940 78 y.o.  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  Per patient's wife he was mowing the lawn around 2:00 states he was not feeling well.  Around 3 PM when she went to check on him she noted he was acting very differently and with confusion as well as expressive aphasia.  Fire department arrived initially at that time patient was ambulatory, by the time EMS arrived patient started having worsening aphasia and route to the ED patient developed acutely right facial droop as well as right sided weakness.  Blood pressure at that time was 220/120.  LSN: 06/06/2017 1630 tPA Given: No: Intracranial hemorrhage Premorbid modified Rankin scale  (mRS):0 Patient was admitted to the neurological intensive care unit. His lood pressure was tightly controlled. He was found to have global aphasia with left gaze palsy and mild left-sided neglect and dense hemiplegia. He was seen in consultation by neurosurgery who felt he was not a candidate for surgery. Subsequent follow-up CT scan showed stable appearance of the hematoma and cytotoxic edema and midline shift. Patient continued to be aphasic and had dysphagia. He was seen by physical and speech from inpatient therapy. Patient's condition did not show significant improvement. He became slightly more alert and interactive but remained aphasic with dense hemiplegia and severely dysphagia. After multiple meetings with the patient's wife as well as subsequently with his son and daughter they felt the patient would not want a permanent feeding tube are go to nursing home and they made the patient DO NOT RESUSCITATE and comfort care. Family also met with palliative care team to further limit this decision. Patient was kept on morphine for comfort and patient's condition rapidly declined on 06/11/2017 and he was found to be in respiratory distress and subsequently found to be apneic with no pulse. Patient was pronounced dead by the nurse and family were notified  CT head 05/19/2017 left frontal large ICH  MRI left frontal large ICH, no CAA or tumor  CTA head and neck - no AVM or aneurysm, left ICA occlusion and right ICA patent s/p CEA  CT repeat 06/03/17 showed slight increased hematoma  Repeat CT 06/04/17 smaller hematoma stable midline shift  Repeat CT 06/06/17 stable hematoma and midline shift  Repeat CT 06/10/17 stable hematoma and midline shift  2D Echo   EF 65-70%  LDL 42  HgbA1c 5     Signed: Antony Contras 2017-06-28, 3:18 PM

## 2017-06-15 NOTE — Progress Notes (Signed)
Met wife and she asked about list of local funeral homes.  I googled and gave list with [place], [place], [place] and [place] [address].  Had prayer with Wife. She is preparing with Hospice/Palliative Care and needed info.  Conard Novak, Chaplain   Jul 02, 2017 1100  Clinical Encounter Type  Visited With Family  Visit Type Spiritual support  Referral From Nurse  Consult/Referral To Chaplain  Spiritual Encounters  Spiritual Needs Prayer;Emotional;Other (Comment)  Stress Factors  Family Stress Factors Exhausted;Health changes;Other (Comment) (comfort care only remaining)

## 2017-06-15 DEATH — deceased

## 2017-08-21 ENCOUNTER — Ambulatory Visit: Payer: PPO | Admitting: Hematology

## 2017-08-21 ENCOUNTER — Other Ambulatory Visit: Payer: PPO

## 2018-05-26 ENCOUNTER — Ambulatory Visit: Payer: PPO | Admitting: Neurology

## 2018-09-29 IMAGING — CT CT HEAD W/O CM
5 of 6 series · 15 of 47 positions shown, 16 images · non-contrast
Comparison: 06/06/2017

CLINICAL DATA: Follow-up of intracranial hemorrhage

EXAM:
CT HEAD WITHOUT CONTRAST
TECHNIQUE: Contiguous axial images were obtained from the base of the skull
through the vertex without intravenous contrast.

[Series 3: head wo · axial · 0.41mm/px · z∈[-148,-94]mm · 2 of 34 slices shown, 3 images]
[im 12/34  brain]
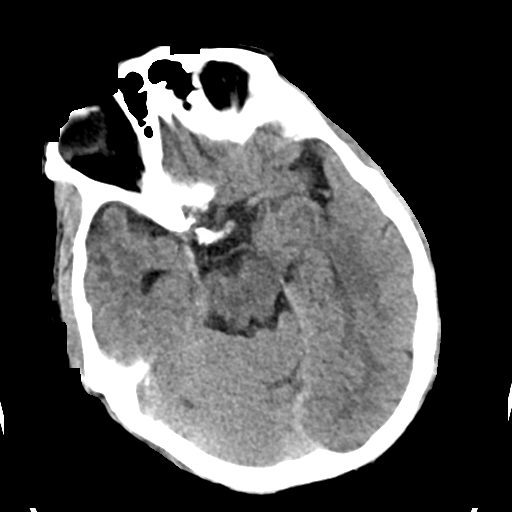
[im 12/34  bone]
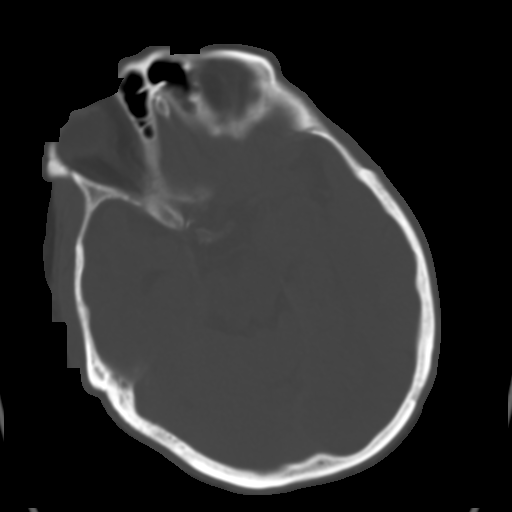
[im 23/34  brain]
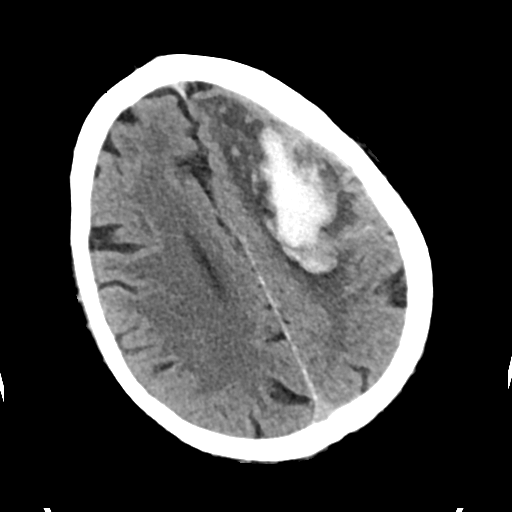

[Series 4: head bone · axial · 0.41mm/px · z∈[-188,-104]mm · 5 of 85 slices shown]
[im 9/85  bone]
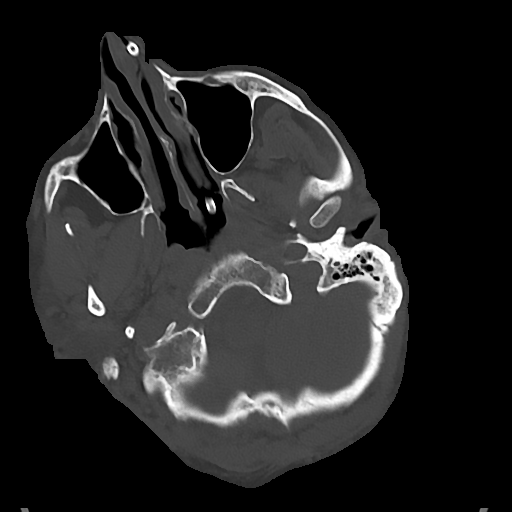
[im 17/85  bone]
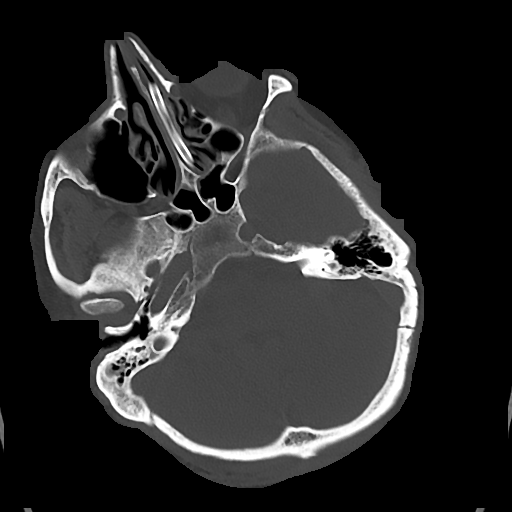
[im 26/85  bone]
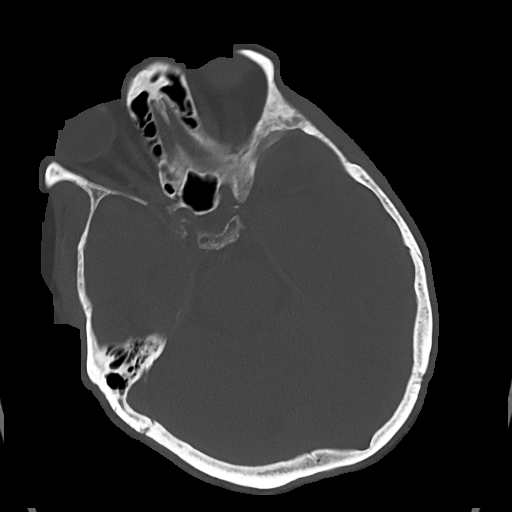
[im 34/85  bone]
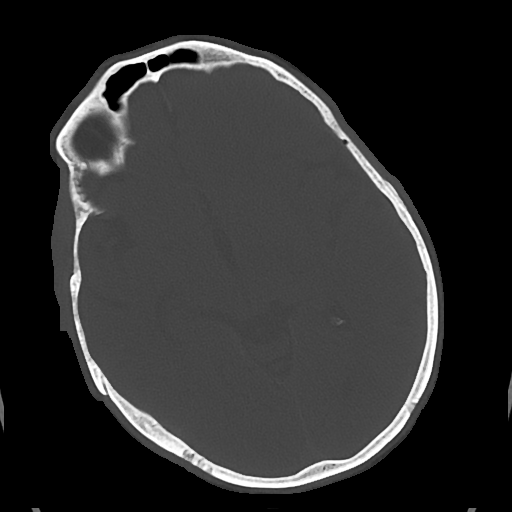
[im 51/85  bone]
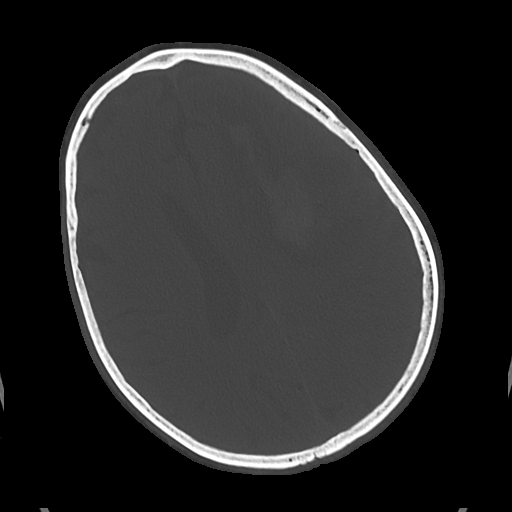

[Series 5: cor soft · coronal · 0.33mm/px · 3 of 65 slices shown]
[im 22/65  brain]
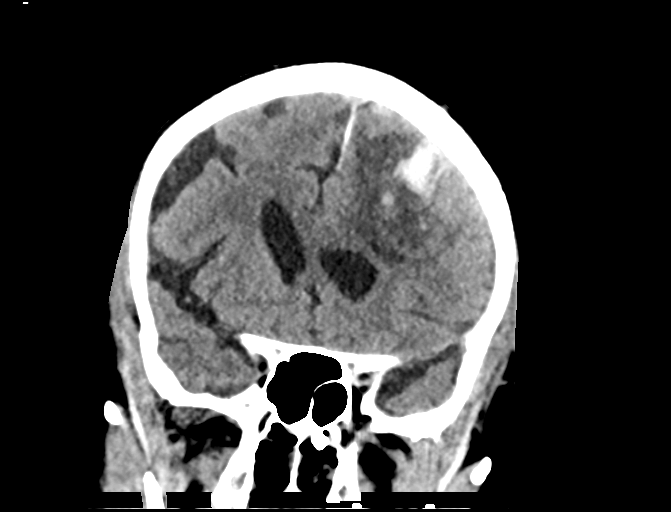
[im 29/65  brain]
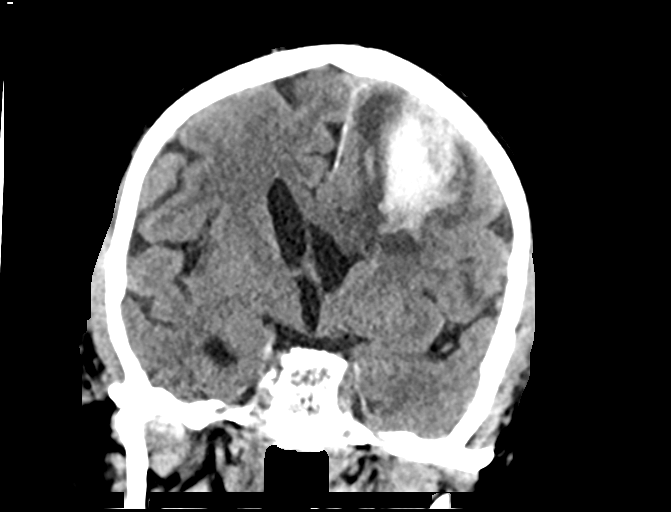
[im 36/65  brain]
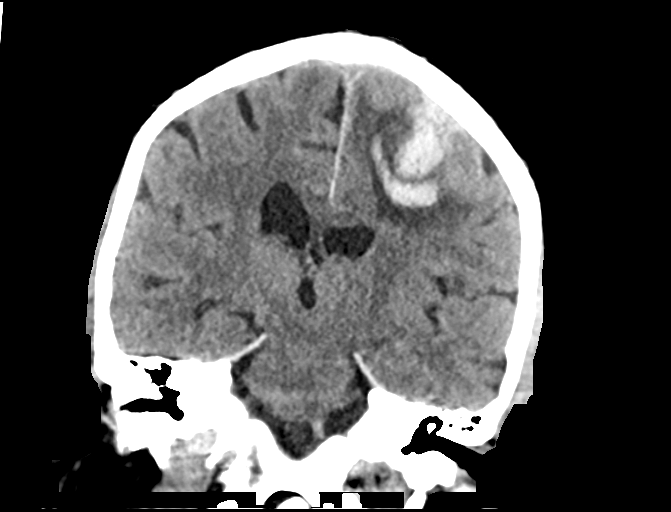

[Series 6: sag soft · sagittal · 0.33mm/px · 3 of 52 slices shown]
[im 18/52  brain]
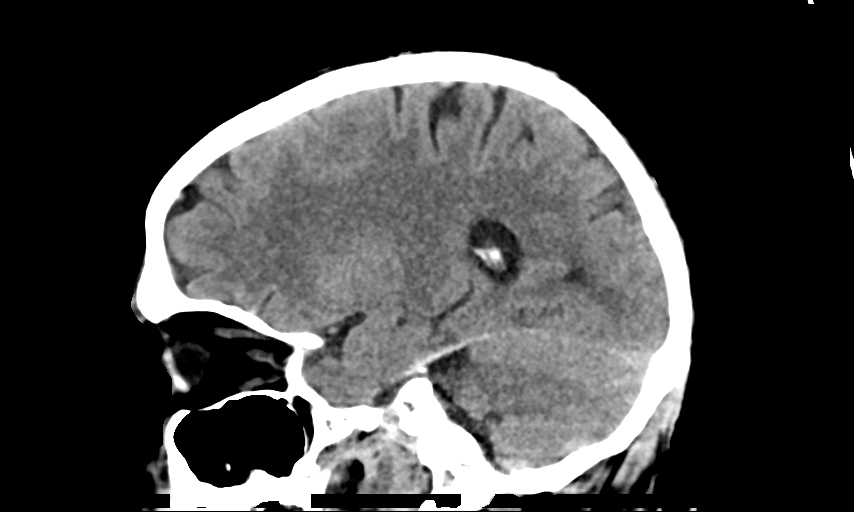
[im 26/52  brain]
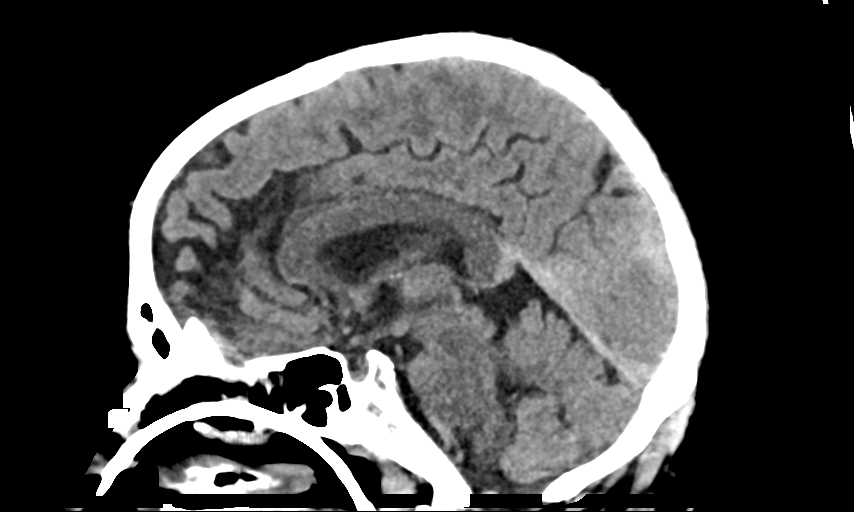
[im 35/52  brain]
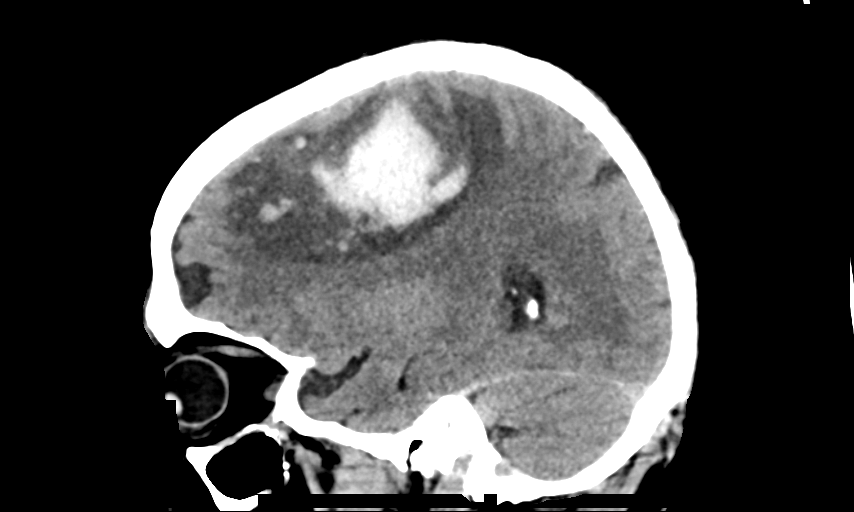

[Series 602: head wo axial corrected rotation · axial · 0.41mm/px · z∈[-148,-94]mm · 2 of 34 slices shown]
[im 12/34  brain]
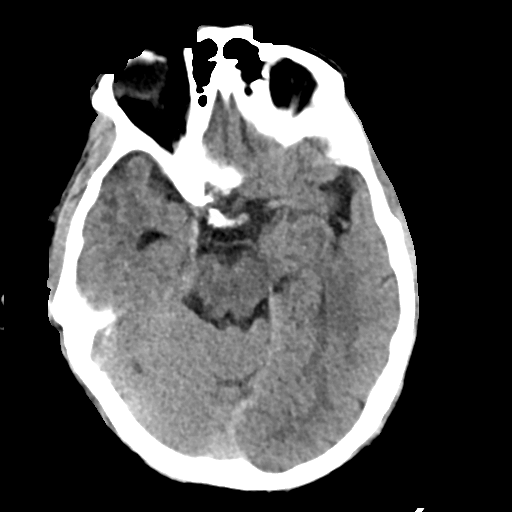
[im 23/34  brain]
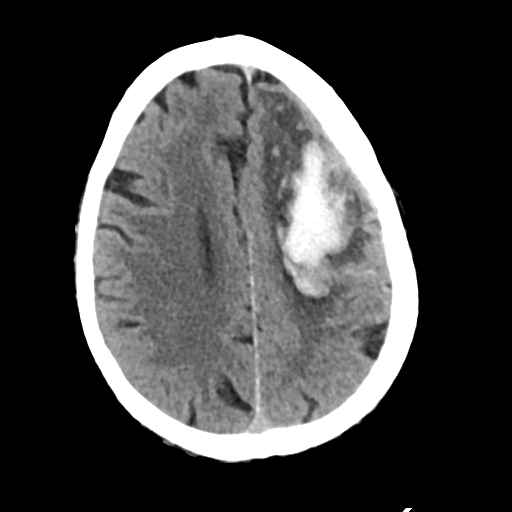

[15 of 47 positions shown; findings below may reference images not displayed]

FINDINGS: Brain: Intraparenchymal hematoma centered in the left frontal lobe
is unchanged in size, with slight shift in configuration. Decreased
amount of blood in the right lateral ventricle. Small amount of
extra-axial blood over the left convexity is unchanged. At the level
of the foramina [place], there is rightward midline shift 5 mm
unchanged. There is no new site of hemorrhage.

Vascular: No abnormal hyperdensity of the major intracranial
arteries or dural venous sinuses. No intracranial atherosclerosis.

Skull: The visualized skull base, calvarium and extracranial soft
tissues are normal.

Sinuses/Orbits: No fluid levels or advanced mucosal thickening of
the visualized paranasal sinuses. No mastoid or middle ear effusion.
The orbits are normal.
IMPRESSION: Expected evolution of intraparenchymal hematoma centered in the left
frontal lobe, without increase in size or new site of hemorrhage.
Unchanged 5 mm rightward midline shift with small volume extra-axial
blood over the left convexity. Intraventricular blood is decreased.
